# Patient Record
Sex: Female | Born: 1950 | Race: White | Hispanic: No | Marital: Single | Smoking: Current every day smoker
Health system: Southern US, Community
[De-identification: ages and names within clinical notes are randomized; demographics above are authoritative.]

## PROBLEM LIST (undated history)

## (undated) DIAGNOSIS — K219 Gastro-esophageal reflux disease without esophagitis: Secondary | ICD-10-CM

## (undated) DIAGNOSIS — R519 Headache, unspecified: Secondary | ICD-10-CM

## (undated) DIAGNOSIS — I639 Cerebral infarction, unspecified: Secondary | ICD-10-CM

## (undated) DIAGNOSIS — R0600 Dyspnea, unspecified: Secondary | ICD-10-CM

## (undated) DIAGNOSIS — M1990 Unspecified osteoarthritis, unspecified site: Secondary | ICD-10-CM

## (undated) DIAGNOSIS — J449 Chronic obstructive pulmonary disease, unspecified: Secondary | ICD-10-CM

## (undated) DIAGNOSIS — M5090 Cervical disc disorder, unspecified, unspecified cervical region: Secondary | ICD-10-CM

## (undated) HISTORY — PX: OOPHORECTOMY: SHX86

## (undated) HISTORY — PX: COLONOSCOPY WITH ESOPHAGOGASTRODUODENOSCOPY (EGD): SHX5779

## (undated) HISTORY — PX: OTHER SURGICAL HISTORY: SHX169

---
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
## 2016-04-15 HISTORY — PX: FRACTURE SURGERY: SHX138

## 2018-06-14 DIAGNOSIS — I639 Cerebral infarction, unspecified: Secondary | ICD-10-CM

## 2018-06-14 HISTORY — DX: Cerebral infarction, unspecified: I63.9

## 2019-01-11 ENCOUNTER — Encounter: Payer: Self-pay | Admitting: Emergency Medicine

## 2019-01-11 ENCOUNTER — Other Ambulatory Visit: Payer: Self-pay

## 2019-01-11 ENCOUNTER — Emergency Department: Payer: Medicare Other

## 2019-01-11 ENCOUNTER — Emergency Department
Admission: EM | Admit: 2019-01-11 | Discharge: 2019-01-11 | Disposition: A | Discharge status: Home / Self Care | Payer: Medicare Other | Attending: Student in an Organized Health Care Education/Training Program | Admitting: Student in an Organized Health Care Education/Training Program

## 2019-01-11 DIAGNOSIS — Y999 Unspecified external cause status: Secondary | ICD-10-CM | POA: Insufficient documentation

## 2019-01-11 DIAGNOSIS — Z8673 Personal history of transient ischemic attack (TIA), and cerebral infarction without residual deficits: Secondary | ICD-10-CM | POA: Insufficient documentation

## 2019-01-11 DIAGNOSIS — Y929 Unspecified place or not applicable: Secondary | ICD-10-CM | POA: Diagnosis not present

## 2019-01-11 DIAGNOSIS — X58XXXA Exposure to other specified factors, initial encounter: Secondary | ICD-10-CM | POA: Insufficient documentation

## 2019-01-11 DIAGNOSIS — M62830 Muscle spasm of back: Secondary | ICD-10-CM

## 2019-01-11 DIAGNOSIS — Z7901 Long term (current) use of anticoagulants: Secondary | ICD-10-CM | POA: Insufficient documentation

## 2019-01-11 DIAGNOSIS — M25561 Pain in right knee: Secondary | ICD-10-CM | POA: Insufficient documentation

## 2019-01-11 DIAGNOSIS — S32040S Wedge compression fracture of fourth lumbar vertebra, sequela: Secondary | ICD-10-CM

## 2019-01-11 DIAGNOSIS — S3992XA Unspecified injury of lower back, initial encounter: Secondary | ICD-10-CM | POA: Insufficient documentation

## 2019-01-11 DIAGNOSIS — S22070A Wedge compression fracture of T9-T10 vertebra, initial encounter for closed fracture: Secondary | ICD-10-CM | POA: Insufficient documentation

## 2019-01-11 DIAGNOSIS — Z79899 Other long term (current) drug therapy: Secondary | ICD-10-CM | POA: Diagnosis not present

## 2019-01-11 DIAGNOSIS — Y939 Activity, unspecified: Secondary | ICD-10-CM | POA: Diagnosis not present

## 2019-01-11 DIAGNOSIS — S22080A Wedge compression fracture of T11-T12 vertebra, initial encounter for closed fracture: Secondary | ICD-10-CM | POA: Diagnosis not present

## 2019-01-11 DIAGNOSIS — S32040A Wedge compression fracture of fourth lumbar vertebra, initial encounter for closed fracture: Secondary | ICD-10-CM | POA: Diagnosis not present

## 2019-01-11 DIAGNOSIS — S22000A Wedge compression fracture of unspecified thoracic vertebra, initial encounter for closed fracture: Secondary | ICD-10-CM

## 2019-01-11 DIAGNOSIS — S299XXA Unspecified injury of thorax, initial encounter: Secondary | ICD-10-CM | POA: Diagnosis present

## 2019-01-11 IMAGING — CR DG KNEE 1-2V*R*
1 series · 2 of 2 positions shown · non-contrast
Comparison: None.

CLINICAL DATA: Atraumatic medial right knee pain

EXAM:
RIGHT KNEE - 1-2 VIEW

[Series 1: dg knee 1-2 views right · 0.14mm/px · 2 of 2 slices shown]
[im 1/2]
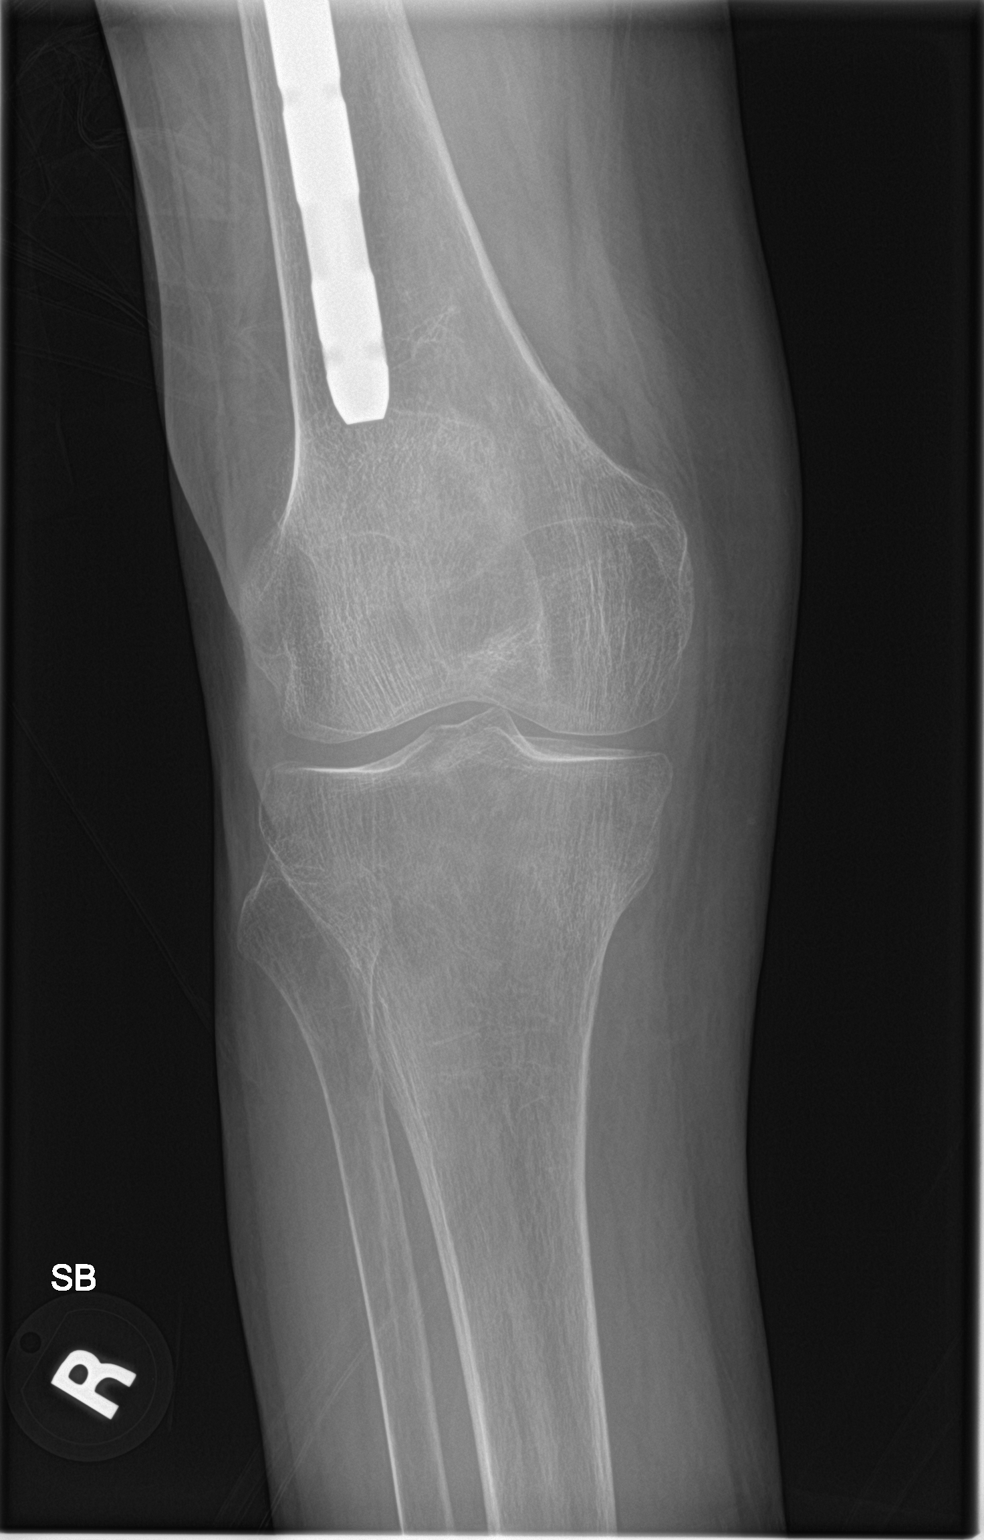
[im 2/2]
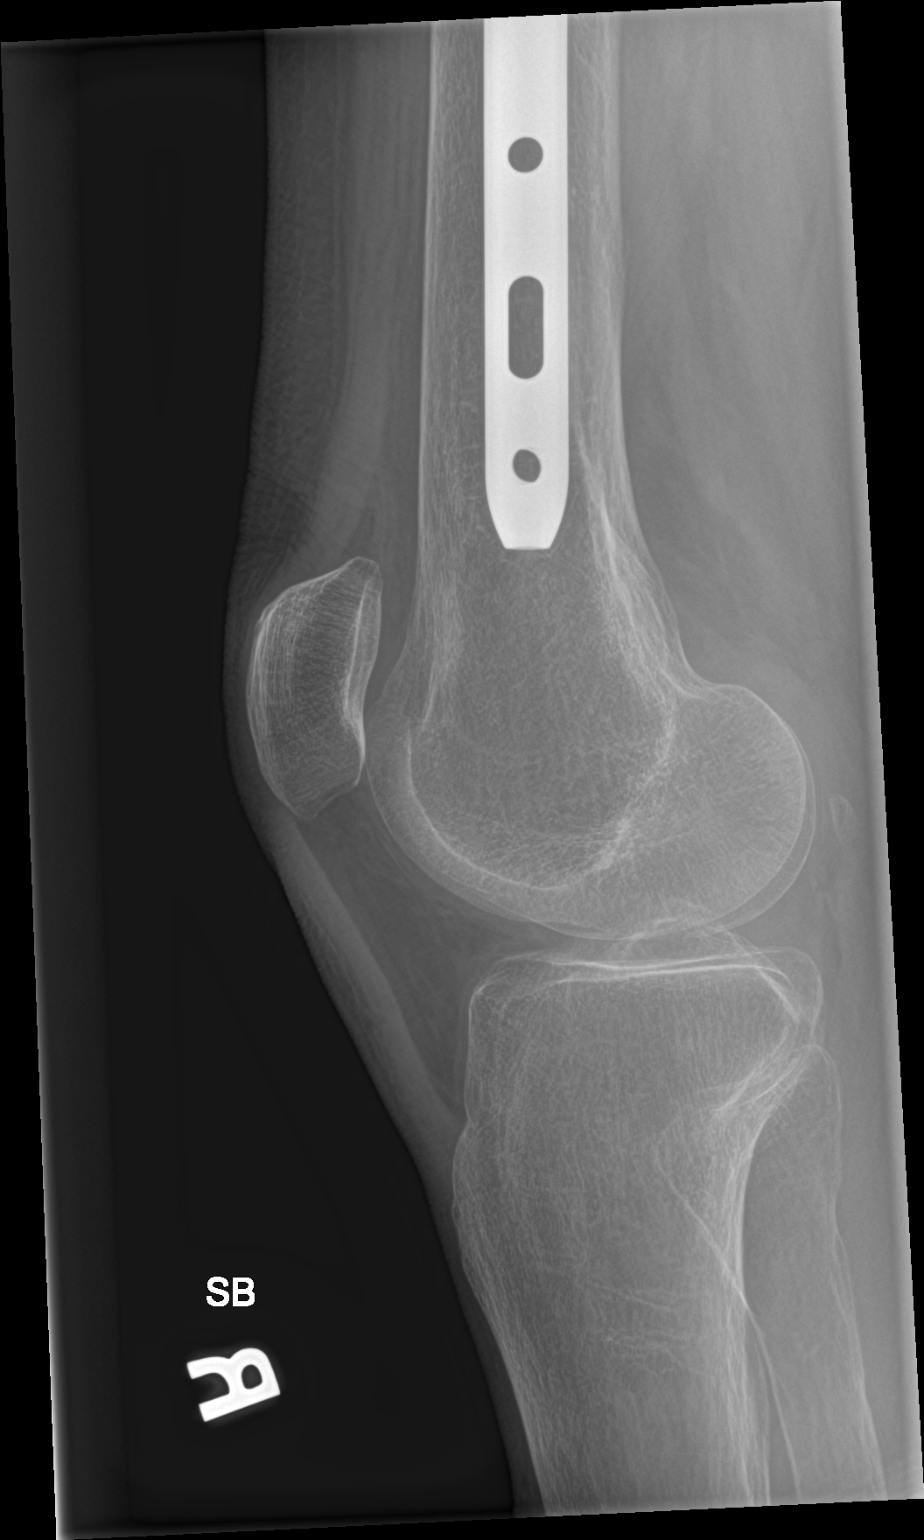

[2 of 2 positions shown; findings below may reference images not displayed]

FINDINGS: There is no displaced fracture. No dislocation. There is osteopenia.
There is joint space narrowing involving the medial compartment.
There is no significant joint effusion. There is a partially
visualized intramedullary nail extending through the distal right
femur.
IMPRESSION: 1. Medial compartment joint space narrowing. No acute bony
abnormality.
2. Osteopenia.

## 2019-01-11 IMAGING — CR DG LUMBAR SPINE 2-3V
1 series · 3 of 3 positions shown · non-contrast
Comparison: No recent.

CLINICAL DATA: Back and knee pain.

EXAM:
LUMBAR SPINE - 2-3 VIEW

[Series 1: dg lumbar spine 2-3 views · 0.14mm/px · 3 of 3 slices shown]
[im 1/3]
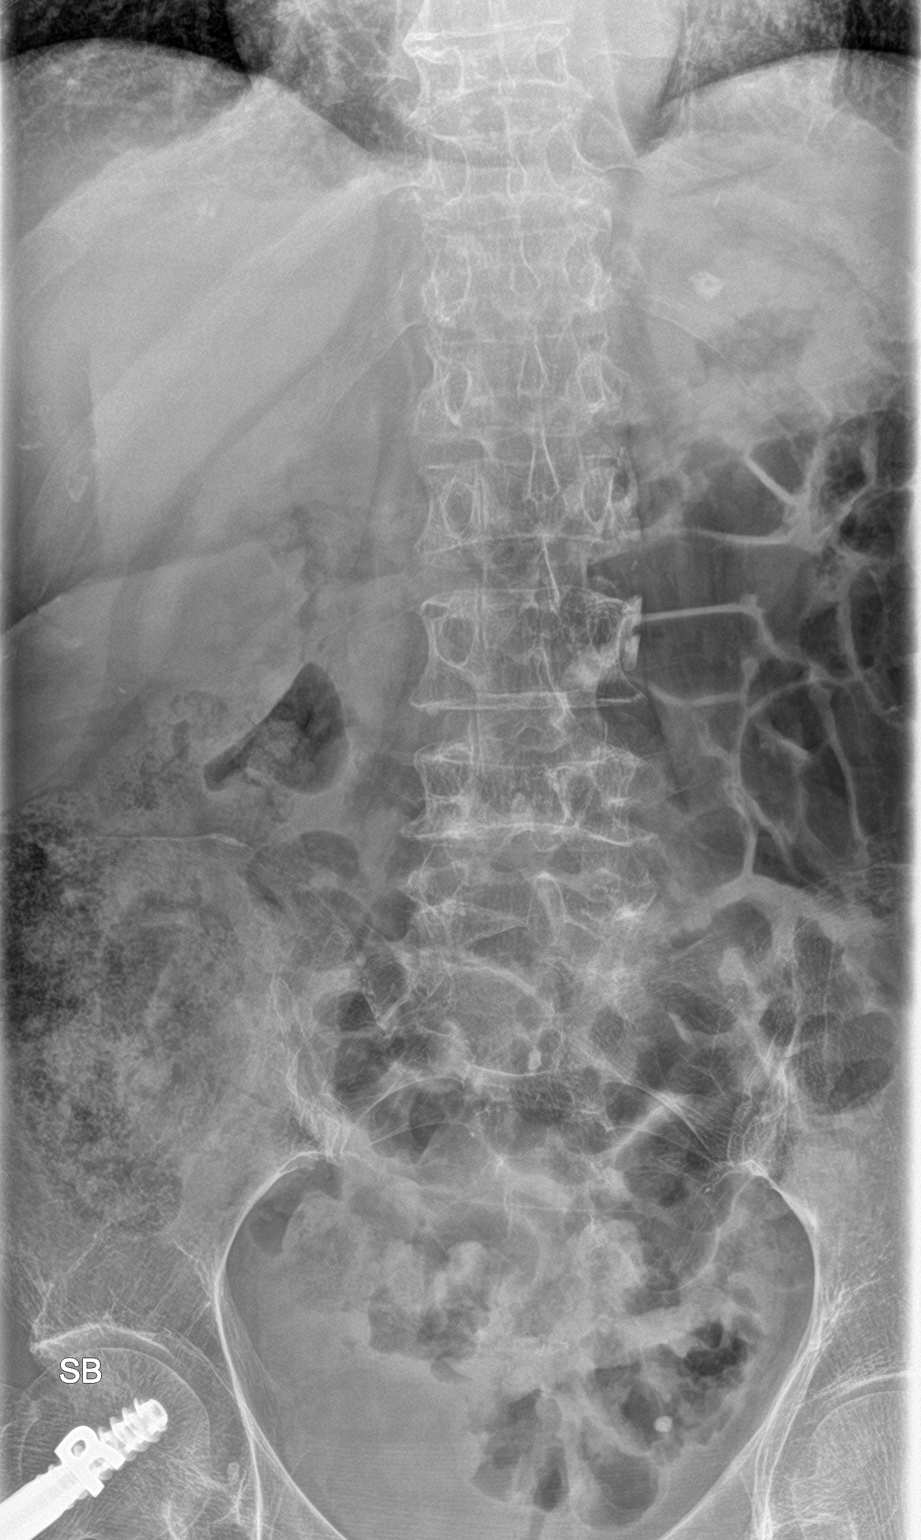
[im 2/3]
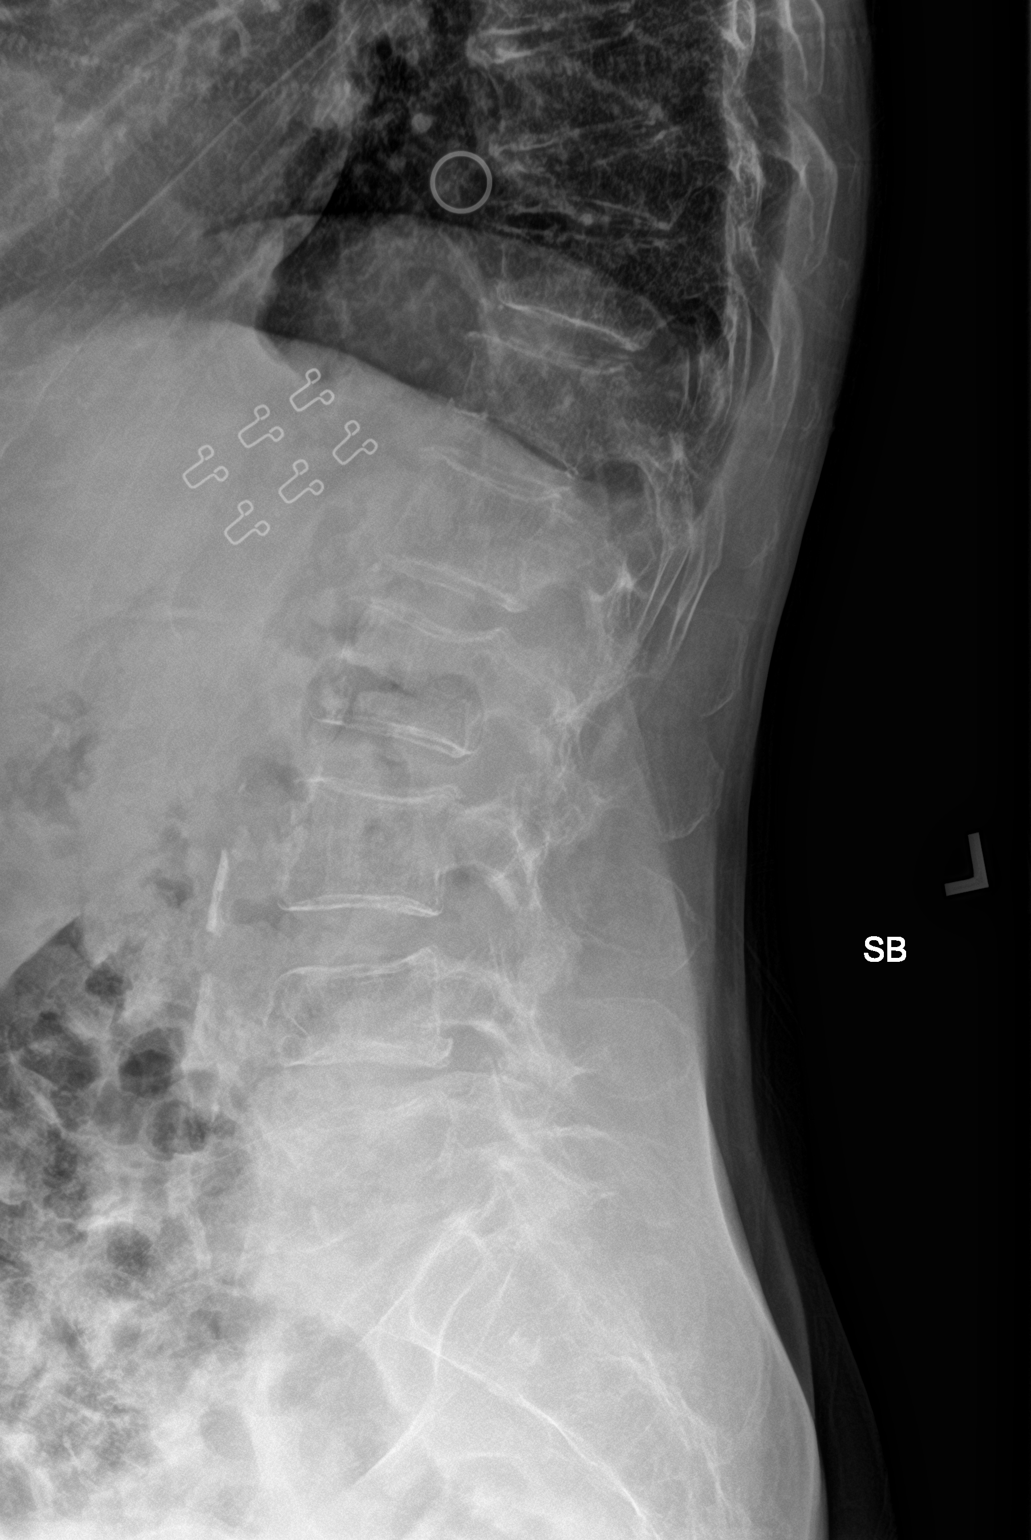
[im 3/3]
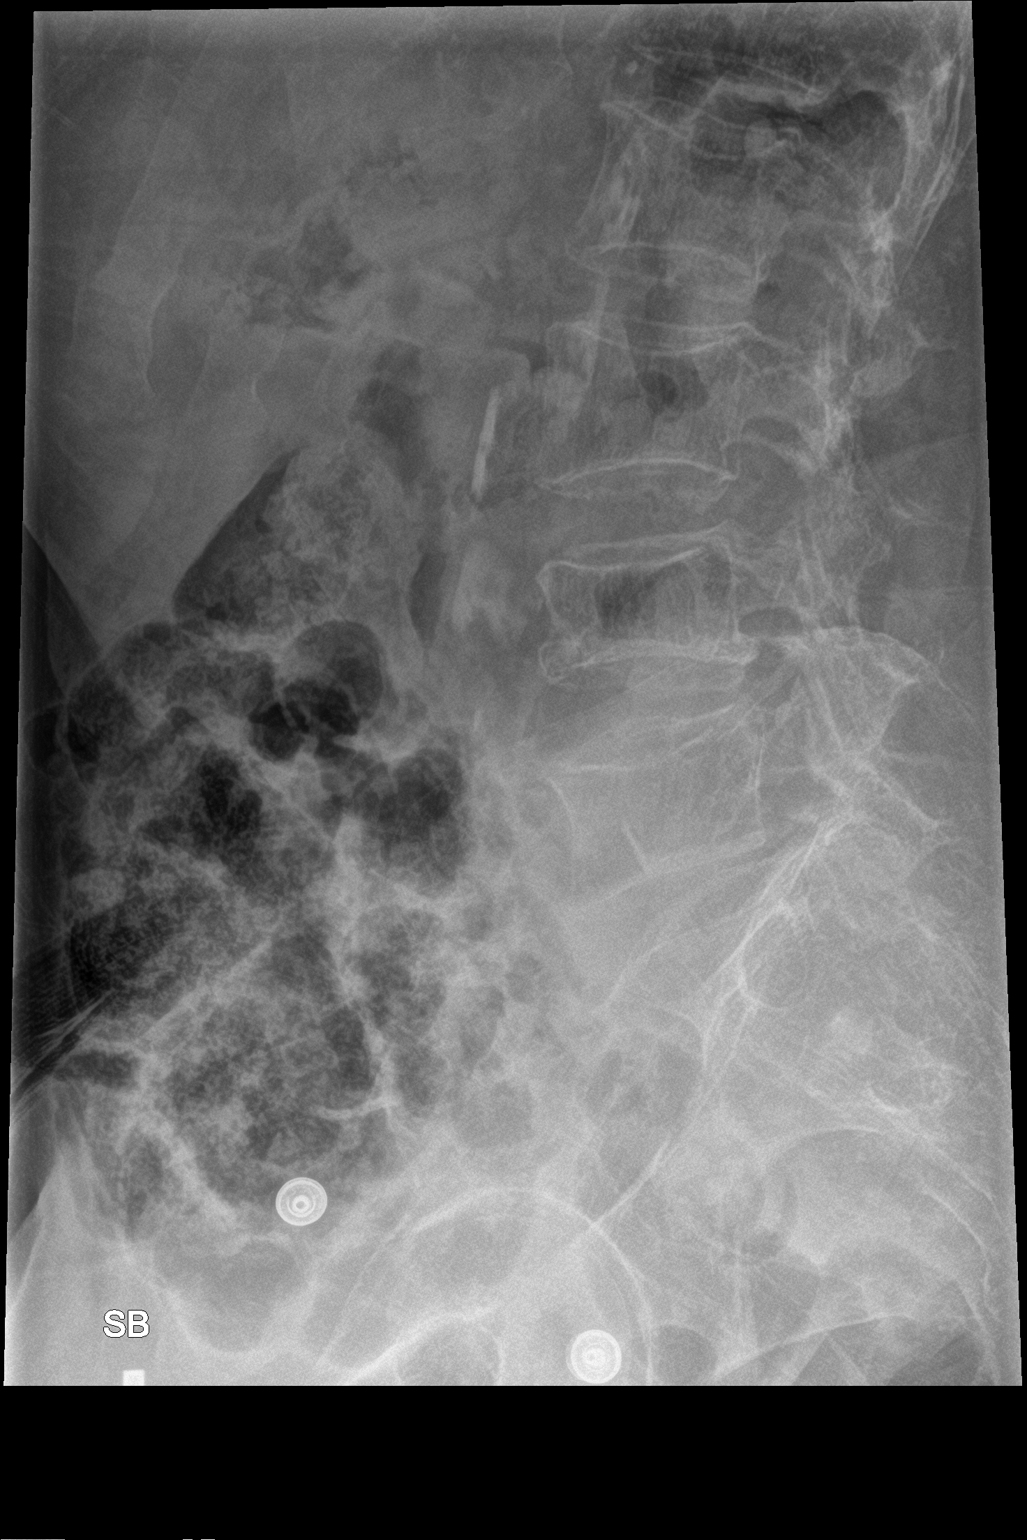

[3 of 3 positions shown; findings below may reference images not displayed]

FINDINGS: Lumbar spine numbered the lowest segmented appearing lumbar shaped
vertebrae on lateral view as L5. Paraspinal soft tissues normal.
Air-filled loops of small and large bowel noted. Adynamic ileus
cannot be excluded. Diffuse osteopenia and multilevel degenerative
change. Compression fractures of T10, T11, T12 and L4 noted. Age is
undetermined. Postsurgical changes right hip. Aortoiliac
atherosclerotic vascular calcification.
IMPRESSION: 1. Multiple thoracic and lumbar spine compression fractures as
above. Age undetermined.

2. Diffuse osteopenia degenerative change. Postsurgical changes
right hip.

3.  Aortoiliac atherosclerotic vascular disease.

## 2019-01-11 MED ORDER — TRAMADOL HCL 50 MG PO TABS: 50.0000 mg | ORAL_TABLET | Freq: Two times a day (BID) | ORAL | 0 refills | Status: DC | PRN

## 2019-01-11 MED ORDER — LIDOCAINE 5 % EX PTCH
1.0000 | MEDICATED_PATCH | CUTANEOUS | Status: DC
  Administered 2019-01-11: 14:00:00 1 via TRANSDERMAL
  Filled 2019-01-11: qty 1

## 2019-01-11 MED ORDER — ORPHENADRINE CITRATE 30 MG/ML IJ SOLN
30.0000 mg | Freq: Two times a day (BID) | INTRAMUSCULAR | Status: DC
  Administered 2019-01-11: 30 mg via INTRAMUSCULAR
  Filled 2019-01-11: qty 2

## 2019-01-11 MED ORDER — LIDOCAINE 5 % EX PTCH: 1.0000 | MEDICATED_PATCH | Freq: Two times a day (BID) | CUTANEOUS | 0 refills | Status: DC

## 2019-01-11 MED ORDER — KETOROLAC TROMETHAMINE 60 MG/2ML IM SOLN
30.0000 mg | Freq: Once | INTRAMUSCULAR | Status: AC
  Administered 2019-01-11: 13:00:00 30 mg via INTRAMUSCULAR
  Filled 2019-01-11: qty 2

## 2019-01-11 MED ORDER — CYCLOBENZAPRINE HCL 5 MG PO TABS: 5.0000 mg | ORAL_TABLET | Freq: Two times a day (BID) | ORAL | 0 refills | Status: AC | PRN

## 2019-01-11 NOTE — ED Triage Notes (Signed)
Mid back pain, states has been moving but denies fall.

## 2019-01-11 NOTE — ED Notes (Signed)
See triage note  Presents with lower back pain and right knee pain  States she just moved here from [place] yesterday    Developed pain yesterday  Hx of osteoporosis and has had several fx's  Denies any falls

## 2019-01-11 NOTE — ED Provider Notes (Signed)
[facility] Emergency Department Provider Note   ____________________________________________   First MD Initiated Contact with Patient 01/11/19 1234     (approximate)  I have reviewed the triage vital signs and the nursing notes.   HISTORY  Chief Complaint Back Pain    HPI Anne Hardy is a 68 y.o. female patient complain of right lateral back pain for approximately 2 weeks.  Patient state onset of pain while preparing to move from [place] to [place].  Patient denies radicular component to her pain.  Patient denies bladder/ bowel dysfunction.  Patient also complaining of right medial knee pain for 1 week.  Patient describes the back pain as "spasmatic".  Patient described knee pain as "achy".  Patient states movement aggravates both complaints.  No palliative measure for complaint.         Past Medical History:  Diagnosis Date  . Stroke [facility])     There are no active problems to display for this patient.     Prior to Admission medications   Medication Sig Start Date End Date Taking? Authorizing Provider  apixaban (ELIQUIS) 5 MG TABS tablet Take 5 mg by mouth 2 (two) times daily.   Yes [provider]  cyclobenzaprine (FLEXERIL) 5 MG tablet Take 5 mg by mouth 3 (three) times daily as needed for muscle spasms.   Yes [provider]  LORazepam (ATIVAN) 0.5 MG tablet Take 0.5 mg by mouth every 8 (eight) hours as needed for anxiety.   Yes [provider]  omeprazole (PRILOSEC) 40 MG capsule Take 40 mg by mouth daily.   Yes [provider]  traZODone (DESYREL) 50 MG tablet Take 50 mg by mouth at bedtime.   Yes [provider]  cyclobenzaprine (FLEXERIL) 5 MG tablet Take 1 tablet (5 mg total) by mouth 2 (two) times daily as needed for muscle spasms. 01/11/19   Sable Feil, PA-C  lidocaine (LIDODERM) 5 % Place 1 patch onto the skin every 12 (twelve) hours. Remove & Discard patch within 12 hours or as  directed by MD 01/11/19 01/11/20  Sable Feil, PA-C  traMADol (ULTRAM) 50 MG tablet Take 1 tablet (50 mg total) by mouth every 12 (twelve) hours as needed. 01/11/19   Sable Feil, PA-C    Allergies Patient has no known allergies.  No family history on file.  Social History Social History   Tobacco Use  . Smoking status: Not on file  Substance Use Topics  . Alcohol use: Not on file  . Drug use: Not on file    Review of Systems  Constitutional: No fever/chills Eyes: No visual changes. ENT: No sore throat. Cardiovascular: Denies chest pain. Respiratory: Denies shortness of breath. Gastrointestinal: No abdominal pain.  No nausea, no vomiting.  No diarrhea.  No constipation. Genitourinary: Negative for dysuria. Musculoskeletal: Positive for back and right leg pain. Skin: Negative for rash. Neurological: Negative for headaches, focal weakness or numbness.   ____________________________________________   PHYSICAL EXAM:  VITAL SIGNS: ED Triage Vitals [01/11/19 1225]  Enc Vitals Group     BP 126/72     Pulse Rate 73     Resp 18     Temp 97.9 F (36.6 C)     Temp Source Oral     SpO2 100 %     Weight 105 lb (47.6 kg)     Height 5\' 1"  (1.549 m)     Head Circumference      Peak Flow  Pain Score      Pain Loc      Pain Edu?      Excl. in [place]?    Constitutional: Alert and oriented. Well appearing and in no acute distress. Neck: No stridor.  No cervical spine tenderness to palpation. Cardiovascular: Normal rate, regular rhythm. Grossly normal heart sounds.  Good peripheral circulation. Respiratory: Normal respiratory effort.  No retractions. Lungs CTAB. Gastrointestinal: Soft and nontender. No distention. No abdominal bruits. No CVA tenderness. Musculoskeletal: No obvious thoracic to lumbar spine deformity.  Patient is moderate guarding palpation midthoracic area and lower back.  Examination of the right knee shows no obvious deformity.  Patient is moderate  guarding palpation the medial tibia tibular joint.  Neurologic:  Normal speech and language. No gross focal neurologic deficits are appreciated. No gait instability. Skin:  Skin is warm, dry and intact. No rash noted. Psychiatric: Mood and affect are normal. Speech and behavior are normal.  ____________________________________________   LABS (all labs ordered are listed, but only abnormal results are displayed)  Labs Reviewed - No data to display ____________________________________________  EKG   ____________________________________________  RADIOLOGY  ED MD interpretation:    Official radiology report(s): Dg Lumbar Spine 2-3 Views  Result Date: 01/11/2019 CLINICAL DATA:  Back and knee pain. EXAM: LUMBAR SPINE - 2-3 VIEW COMPARISON:  No recent. FINDINGS: Lumbar spine numbered the lowest segmented appearing lumbar shaped vertebrae on lateral view as L5. Paraspinal soft tissues normal. Air-filled loops of small and large bowel noted. Adynamic ileus cannot be excluded. Diffuse osteopenia and multilevel degenerative change. Compression fractures of T10, T11, T12 and L4 noted. Age is undetermined. Postsurgical changes right hip. Aortoiliac atherosclerotic vascular calcification. IMPRESSION: 1. Multiple thoracic and lumbar spine compression fractures as above. Age undetermined. 2. Diffuse osteopenia degenerative change. Postsurgical changes right hip. 3.  Aortoiliac atherosclerotic vascular disease. Electronically Signed   By: Marcello Moores  Register   On: 01/11/2019 13:41   Dg Knee 2 Views Right  Result Date: 01/11/2019 CLINICAL DATA:  Atraumatic medial right knee pain EXAM: RIGHT KNEE - 1-2 VIEW COMPARISON:  None. FINDINGS: There is no displaced fracture. No dislocation. There is osteopenia. There is joint space narrowing involving the medial compartment. There is no significant joint effusion. There is a partially visualized intramedullary nail extending through the distal right femur.  IMPRESSION: 1. Medial compartment joint space narrowing. No acute bony abnormality. 2. Osteopenia. Electronically Signed   By: Constance Holster M.D.   On: 01/11/2019 13:36    ____________________________________________   PROCEDURES  Procedure(s) performed (including Critical Care):  Procedures   ____________________________________________   INITIAL IMPRESSION / ASSESSMENT AND PLAN / ED COURSE  As part of my medical decision making, I reviewed the following data within the [place] was evaluated in Emergency Department on 01/11/2019 for the symptoms described in the history of present illness. She was evaluated in the context of the global COVID-19 pandemic, which necessitated consideration that the patient might be at risk for infection with the SARS-CoV-2 virus that causes COVID-19. Institutional protocols and algorithms that pertain to the evaluation of patients at risk for COVID-19 are in a state of rapid change based on information released by regulatory bodies including the CDC and federal and state organizations. These policies and algorithms were followed during the patient's care in the ED.  Patient presents with upper/  lower back pain and right knee pain.  Discussed x-ray finding with patient was  consistent with multiple compression fractures of the T and lumbar spine.  Patient degenerative changes in the right knee.  Patient will be follow-up with orthopedics for definitive evaluation and treatment.  Patient given discharge care instructions.      ____________________________________________   FINAL CLINICAL IMPRESSION(S) / ED DIAGNOSES  Final diagnoses:  Compression fracture of body of thoracic vertebra (HCC)  Compression fracture of L4 lumbar vertebra, sequela  Muscle spasm of back     ED Discharge Orders         Ordered    cyclobenzaprine (FLEXERIL) 5 MG tablet  2 times daily PRN     01/11/19 1407    traMADol  (ULTRAM) 50 MG tablet  Every 12 hours PRN     01/11/19 1407    lidocaine (LIDODERM) 5 %  Every 12 hours     01/11/19 1407           Note:  This document was prepared using Dragon voice recognition software and may include unintentional dictation errors.    Sable Feil, PA-C 01/11/19 1412    Merlyn Lot, MD 01/11/19 [phone]

## 2019-01-11 NOTE — Discharge Instructions (Addendum)
Follow discharge care instructions take medication as directed.  Schedule appointment for orthopedic for definitive evaluation and treatment.  Establish care with family doctor to get a consult to rheumatology.

## 2019-01-14 ENCOUNTER — Other Ambulatory Visit: Payer: Self-pay | Admitting: Orthopedic Surgery

## 2019-01-14 DIAGNOSIS — G8929 Other chronic pain: Secondary | ICD-10-CM

## 2019-01-20 ENCOUNTER — Other Ambulatory Visit: Payer: Self-pay | Admitting: Orthopedic Surgery

## 2019-01-20 DIAGNOSIS — S22000A Wedge compression fracture of unspecified thoracic vertebra, initial encounter for closed fracture: Secondary | ICD-10-CM

## 2019-01-20 DIAGNOSIS — S32040A Wedge compression fracture of fourth lumbar vertebra, initial encounter for closed fracture: Secondary | ICD-10-CM

## 2019-01-21 ENCOUNTER — Ambulatory Visit
Admission: RE | Admit: 2019-01-21 | Discharge: 2019-01-21 | Disposition: A | Discharge status: Home / Self Care | Payer: Medicare Other | Source: Ambulatory Visit | Attending: Orthopedic Surgery | Admitting: Orthopedic Surgery

## 2019-01-21 ENCOUNTER — Other Ambulatory Visit: Payer: Self-pay

## 2019-01-21 DIAGNOSIS — S22000A Wedge compression fracture of unspecified thoracic vertebra, initial encounter for closed fracture: Secondary | ICD-10-CM | POA: Diagnosis present

## 2019-01-21 DIAGNOSIS — S32040A Wedge compression fracture of fourth lumbar vertebra, initial encounter for closed fracture: Secondary | ICD-10-CM

## 2019-01-21 IMAGING — MR MR THORACIC SPINE W/O CM
5 of 6 series · 27 of 48 positions shown · non-contrast
Comparison: None.

CLINICAL DATA: Mid low back pain for 3-4 weeks

EXAM:
MRI THORACIC AND LUMBAR SPINE WITHOUT CONTRAST
TECHNIQUE: Multiplanar and multiecho pulse sequences of the thoracic and lumbar
spine were obtained without intravenous contrast.

[Series 16: T1 · sagittal · 5.0mm · 1.88mm/px · 2 of 9 slices shown (1 of 2)]
[im 1/9]
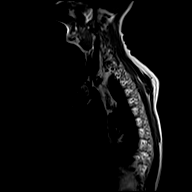
[im 9/9]
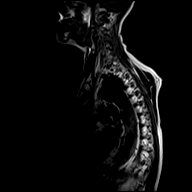

[Series 17: T2 · sagittal · 3.0mm · 1.06mm/px · 6 of 17 slices shown (1 of 2)]
[im 1/17]
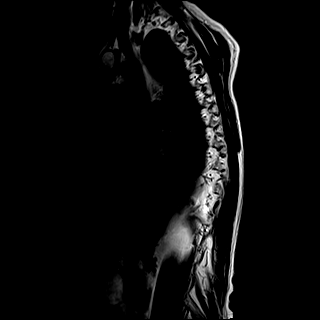
[im 4/17]
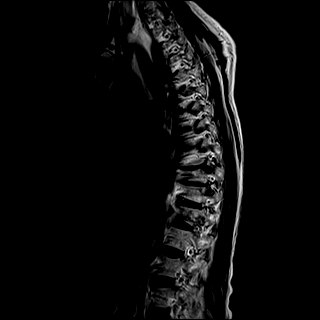
[im 7/17]
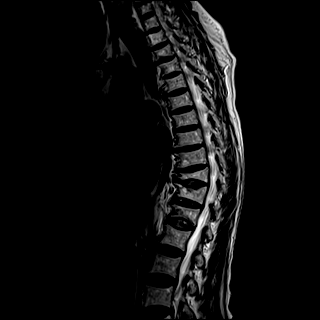
[im 10/17]
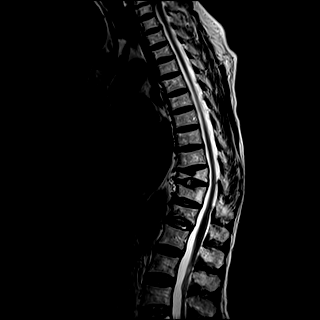
[im 13/17]
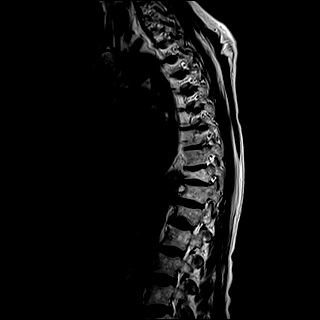
[im 17/17]
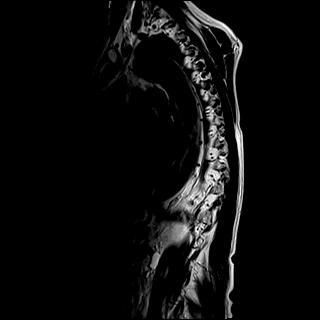

[Series 18: T1 · sagittal · 3.0mm · 1.06mm/px · 6 of 17 slices shown (2 of 2)]
[im 1/17]
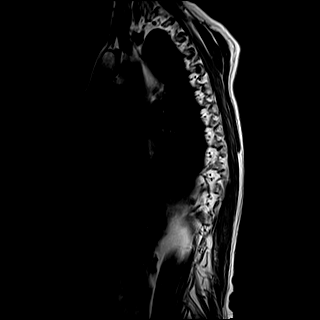
[im 4/17]
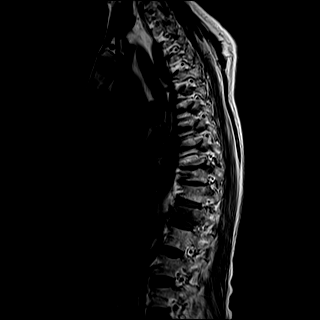
[im 7/17]
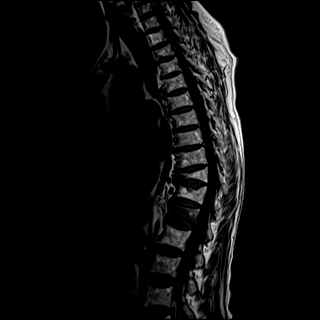
[im 10/17]
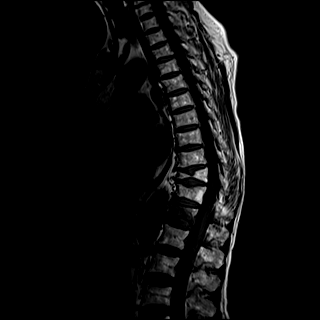
[im 13/17]
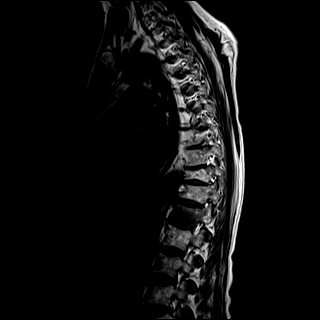
[im 17/17]
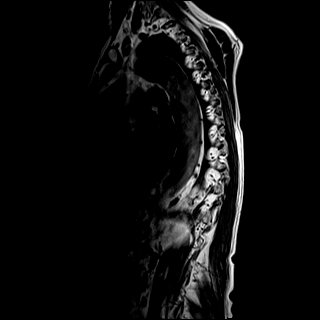

[Series 19: STIR · sagittal · 3.0mm · 0.53mm/px · 5 of 17 slices shown]
[im 1/17]
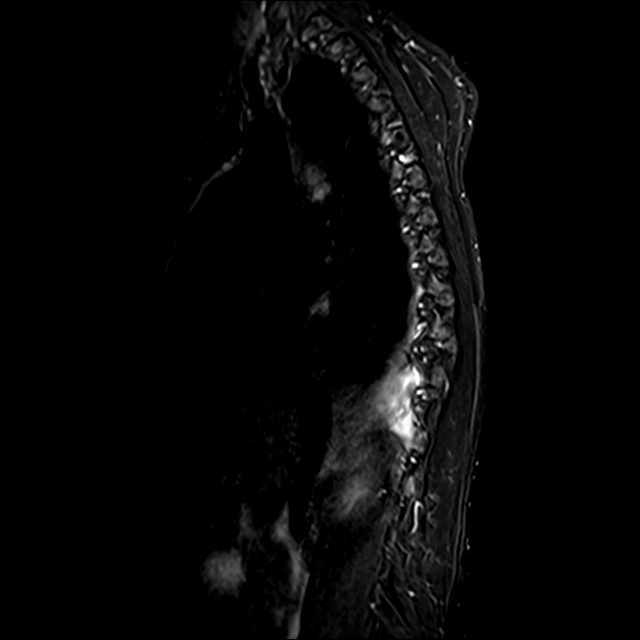
[im 4/17]
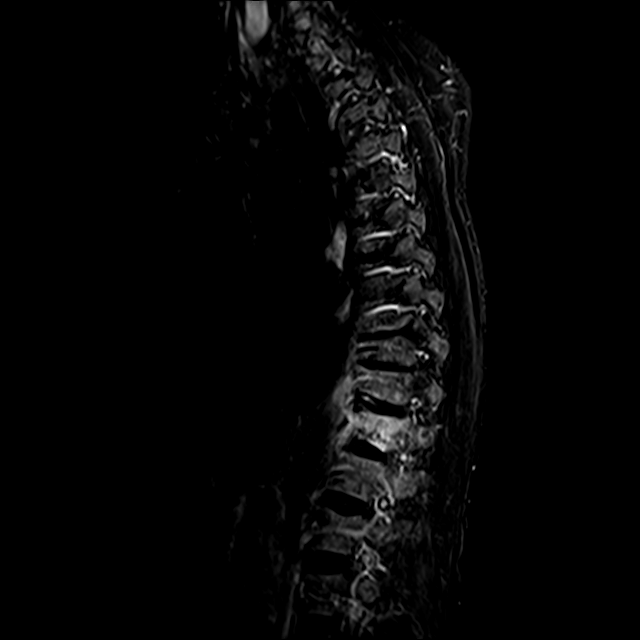
[im 7/17]
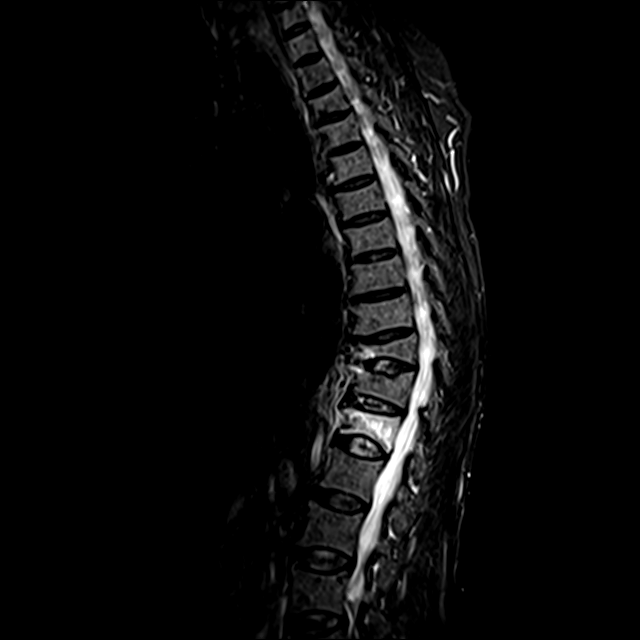
[im 10/17]
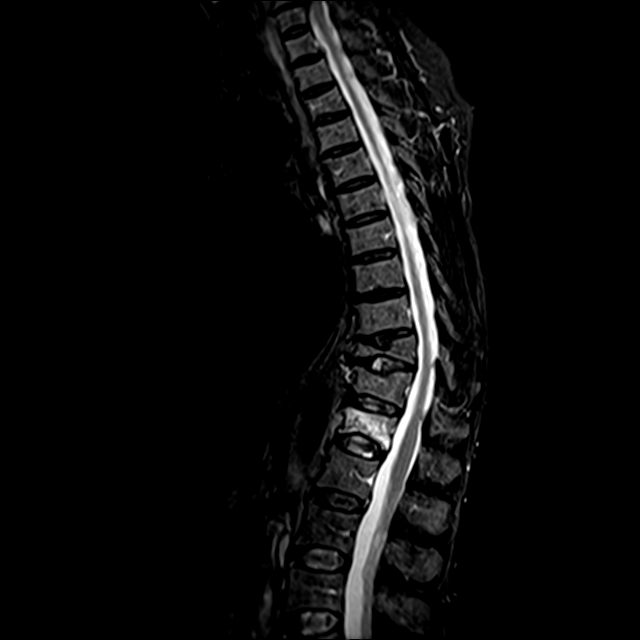
[im 13/17]
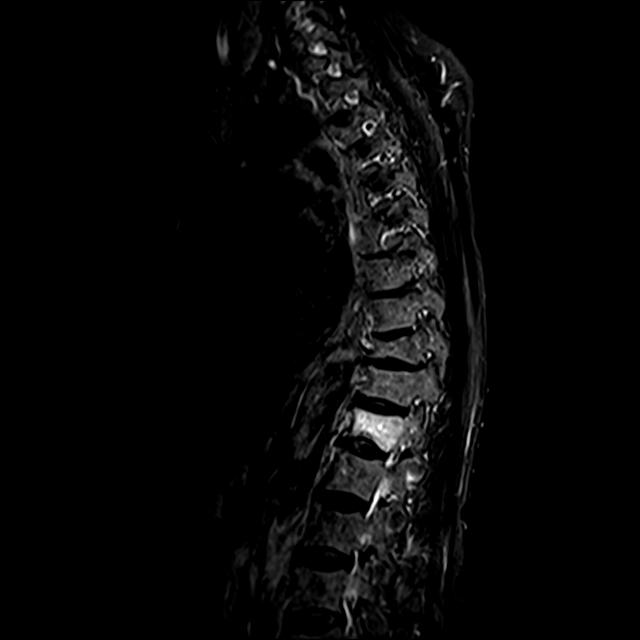

[Series 20: T2 · axial · 4.0mm · 0.59mm/px · z∈[-237,-34]mm · 8 of 41 slices shown (2 of 2)]
[im 1/41]
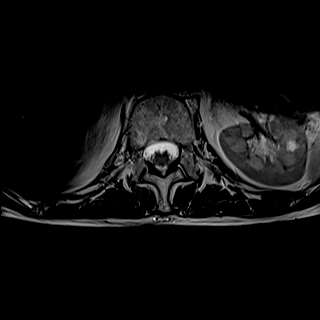
[im 7/41]
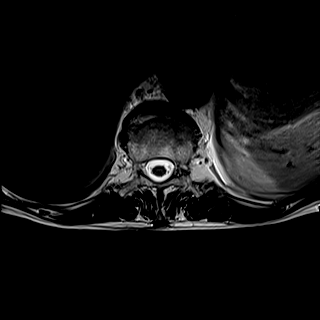
[im 13/41]
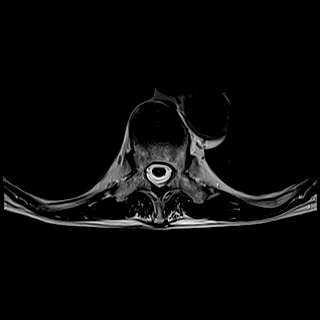
[im 19/41]
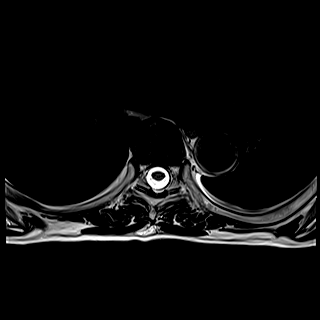
[im 22/41]
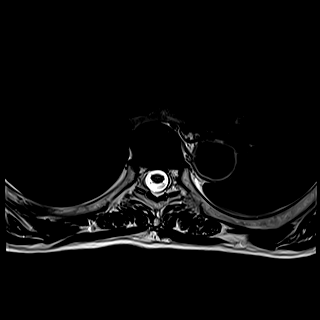
[im 28/41]
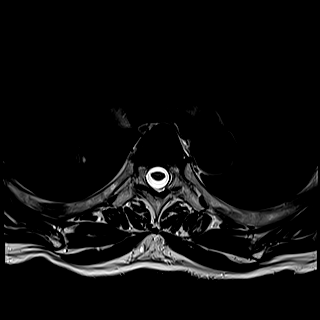
[im 34/41]
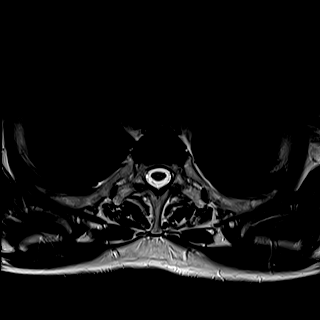
[im 41/41]
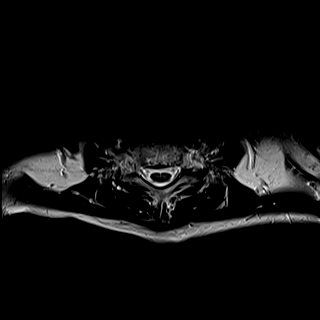

[27 of 48 positions shown; findings below may reference images not displayed]

FINDINGS: MRI THORACIC SPINE FINDINGS

Alignment:  Physiologic.  Relative kyphosis centered at T10

Vertebrae: No discitis or osteomyelitis. No aggressive osseous
lesion. Severe T10 chronic vertebral body compression fracture with
approximately 85% height loss. Chronic T11 vertebral body
compression fracture with approximately 20% anterior height loss.
Acute T12 vertebral body compression fracture with severe marrow
edema throughout the vertebral body and approximately 50% height
loss.

Cord:  Normal signal and morphology.

Paraspinal and other soft tissues: No acute paraspinal abnormality.
12 mm indeterminate right upper lobe pulmonary nodule.

Disc levels:

Disc spaces: Mild degenerative disease with disc height loss at
T10-11.

T1-T2: No disc protrusion, foraminal stenosis or central canal
stenosis.

T2-T3: No disc protrusion, foraminal stenosis or central canal
stenosis.

T3-T4: No disc protrusion, foraminal stenosis or central canal
stenosis.

T4-T5: No disc protrusion, foraminal stenosis or central canal
stenosis.

T5-T6: No disc protrusion, foraminal stenosis or central canal
stenosis.

T6-T7: No disc protrusion, foraminal stenosis or central canal
stenosis.

T7-T8: No disc protrusion, foraminal stenosis or central canal
stenosis.

T8-T9: Mild broad-based disc bulge with a tiny central disc
protrusion. No foraminal or central canal stenosis.

T9-T10: No disc protrusion, foraminal stenosis or central canal
stenosis.

T10-T11: No disc protrusion, foraminal stenosis or central canal
stenosis.

T11-T12: No disc protrusion, foraminal stenosis or central canal
stenosis.

MRI LUMBAR SPINE FINDINGS

Segmentation:  Standard.

Alignment:  Physiologic.

Vertebrae: L4 vertebral body compression fracture with approximately
50% height loss and minimal marrow edema within the vertebral body.
No discitis or osteomyelitis. No aggressive osseous lesion.

Conus medullaris and cauda equina: Conus extends to the L1 level.
Conus and cauda equina appear normal.

Paraspinal and other soft tissues: No acute paraspinal abnormality.

Disc levels:

Disc spaces: Disc spaces are relatively well maintained. Disc
desiccation at L5-S1.

T12-L1: No significant disc bulge. No evidence of neural foraminal
stenosis. No central canal stenosis.

L1-L2: No significant disc bulge. No evidence of neural foraminal
stenosis. No central canal stenosis.

L2-L3: No significant disc bulge. No evidence of neural foraminal
stenosis. No central canal stenosis.

L3-L4: No significant disc bulge. No evidence of neural foraminal
stenosis. No central canal stenosis.

L4-L5: Mild broad-based disc bulge with a broad central disc
protrusion. Bilateral lateral recess narrowing. Mild bilateral
foraminal narrowing. No central canal stenosis.

L5-S1: Broad-based disc bulge with a small central disc protrusion
and a central annular fissure. No evidence of neural foraminal
stenosis. No central canal stenosis.
IMPRESSION: MR THORACIC SPINE IMPRESSION

1. Acute T12 vertebral body compression fracture with approximately
50% height loss and marrow edema throughout the vertebral body.
2. Chronic T10 and T11 vertebral body compression fractures.
3. Indeterminate 12 mm right upper lobe pulmonary nodule. Recommend
further evaluation with a dedicated CT of the chest.

MR LUMBAR SPINE IMPRESSION

1. Subacute-chronic L4 vertebral body compression fracture with
minimal residual marrow edema within the vertebral body.
2. At L4-5 there is a mild broad-based disc bulge with a broad
central disc protrusion. Mild bilateral lateral recess narrowing and
mild bilateral foraminal narrowing.
3. At L5-S1 there is a broad-based disc bulge with a small central
disc protrusion and a central annular fissure.

## 2019-01-21 IMAGING — MR MR LUMBAR SPINE W/O CM
4 of 5 series · 32 of 48 positions shown · non-contrast
Comparison: None.

CLINICAL DATA: Mid low back pain for 3-4 weeks

EXAM:
MRI THORACIC AND LUMBAR SPINE WITHOUT CONTRAST
TECHNIQUE: Multiplanar and multiecho pulse sequences of the thoracic and lumbar
spine were obtained without intravenous contrast.

[Series 22: T2 · sagittal · 4.0mm · 0.81mm/px · 8 of 15 slices shown (1 of 2)]
[im 1/15]
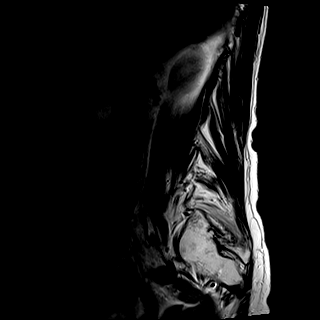
[im 3/15]
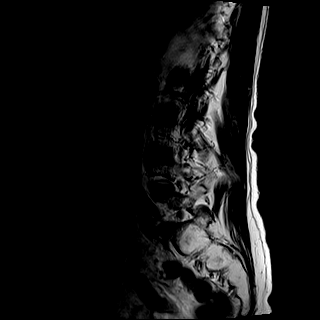
[im 5/15]
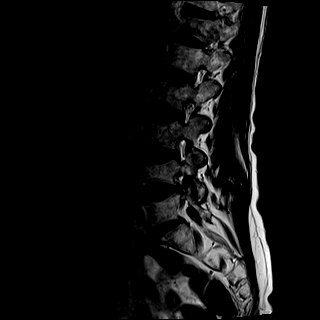
[im 7/15]
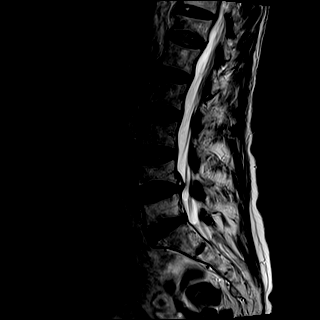
[im 9/15]
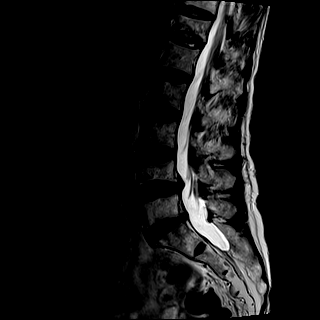
[im 11/15]
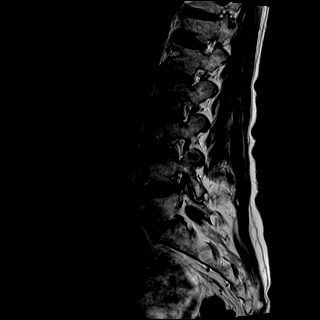
[im 13/15]
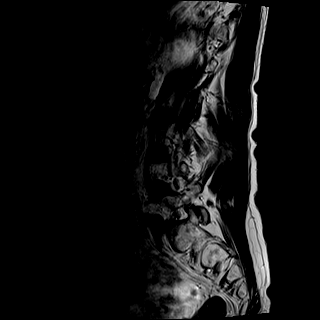
[im 15/15]
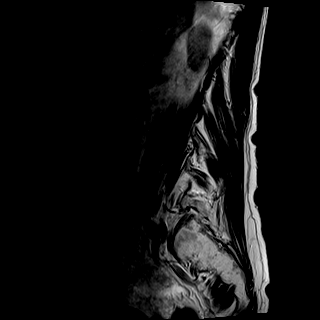

[Series 23: T1 · sagittal · 4.0mm · 0.81mm/px · 7 of 15 slices shown (1 of 2)]
[im 1/15]
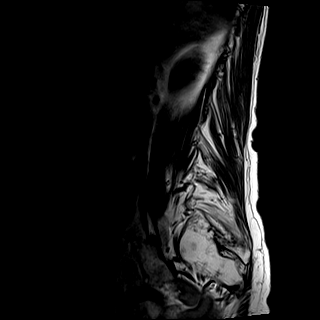
[im 3/15]
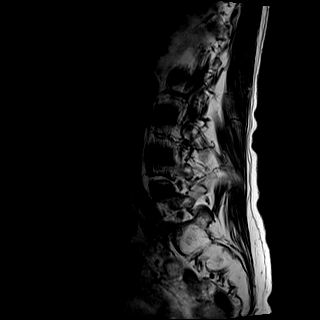
[im 5/15]
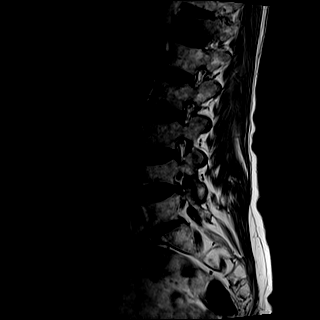
[im 8/15]
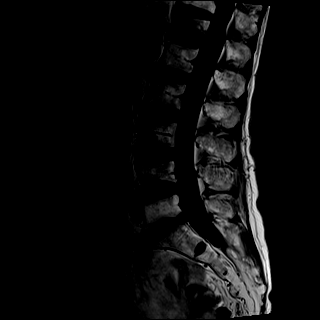
[im 10/15]
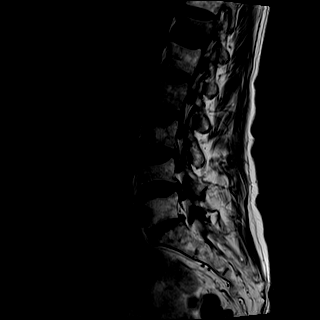
[im 12/15]
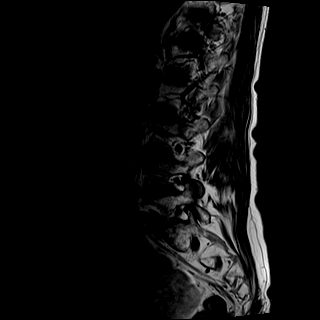
[im 15/15]
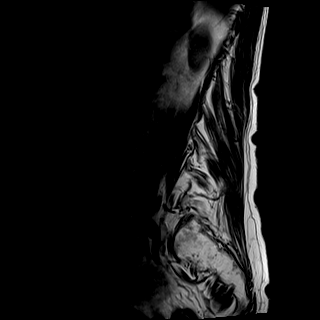

[Series 25: T2 · axial · 4.0mm · 0.78mm/px · z∈[-413,-244]mm · 9 of 26 slices shown (2 of 2)]
[im 1/26]
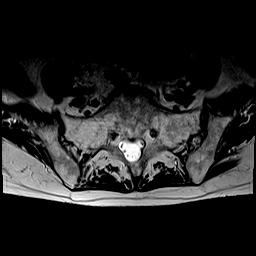
[im 5/26]
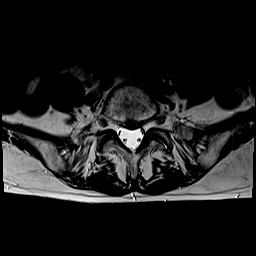
[im 9/26]
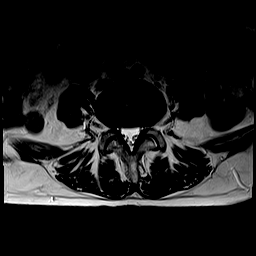
[im 11/26]
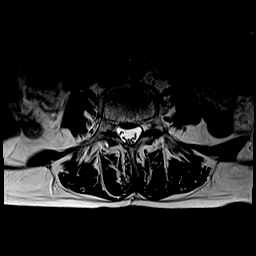
[im 13/26]
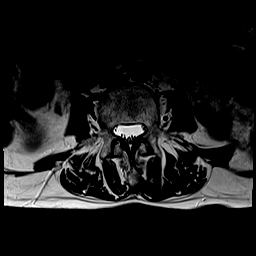
[im 15/26]
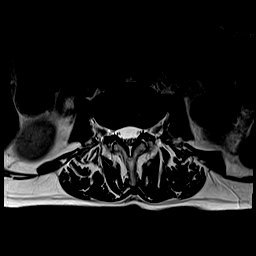
[im 17/26]
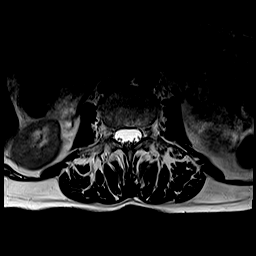
[im 21/26]
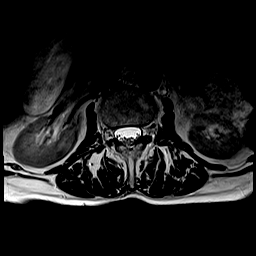
[im 26/26]
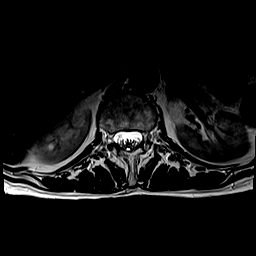

[Series 26: T1 · axial · 4.0mm · 0.39mm/px · z∈[-413,-273]mm · 8 of 26 slices shown (2 of 2)]
[im 1/26]
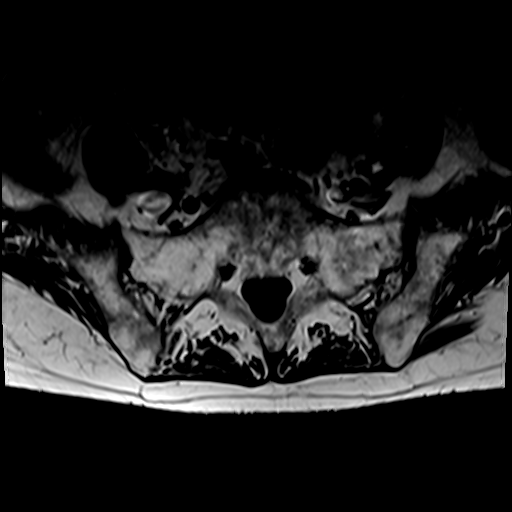
[im 5/26]
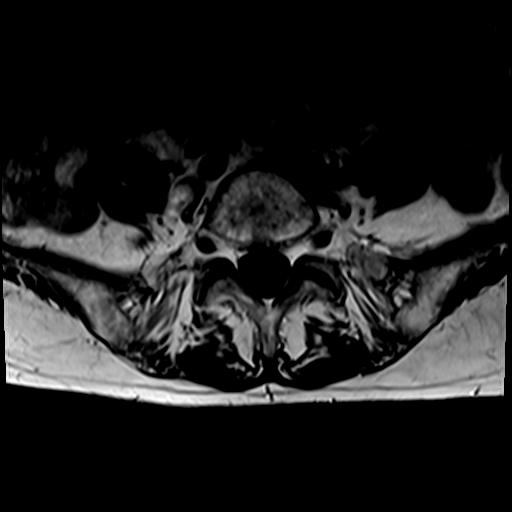
[im 9/26]
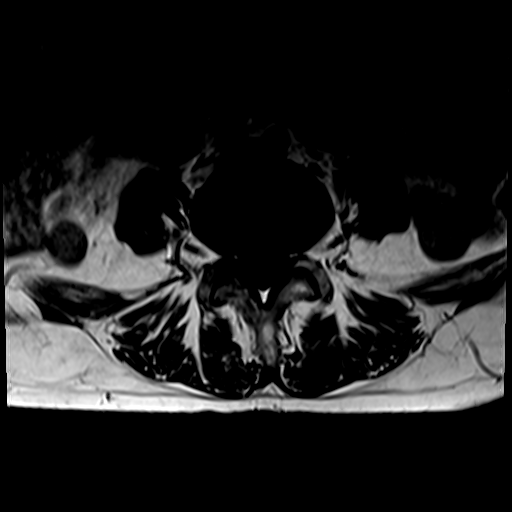
[im 11/26]
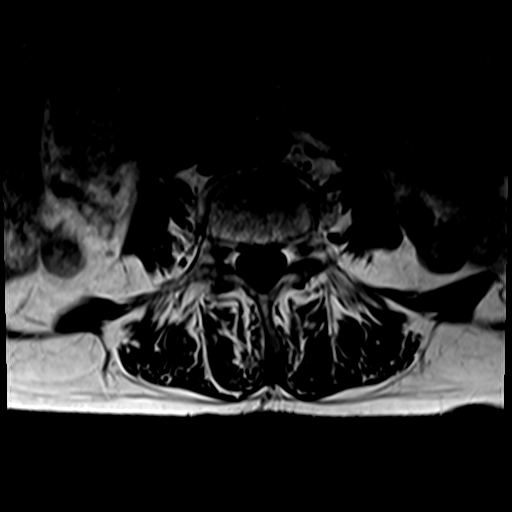
[im 13/26]
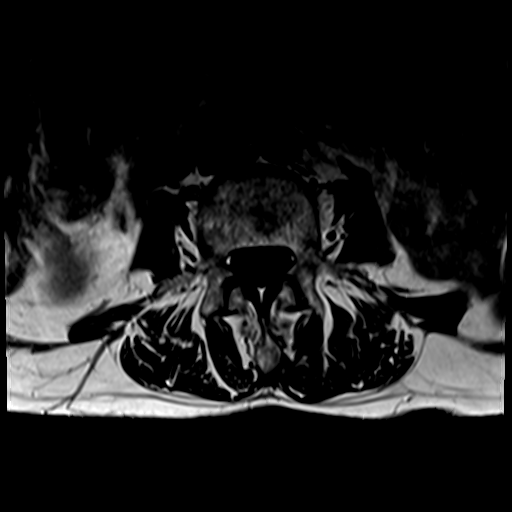
[im 15/26]
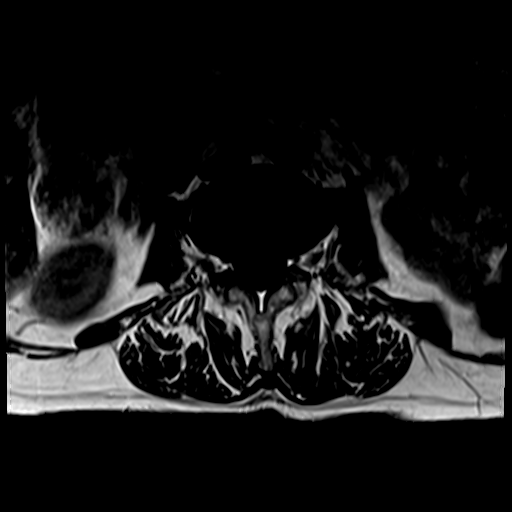
[im 17/26]
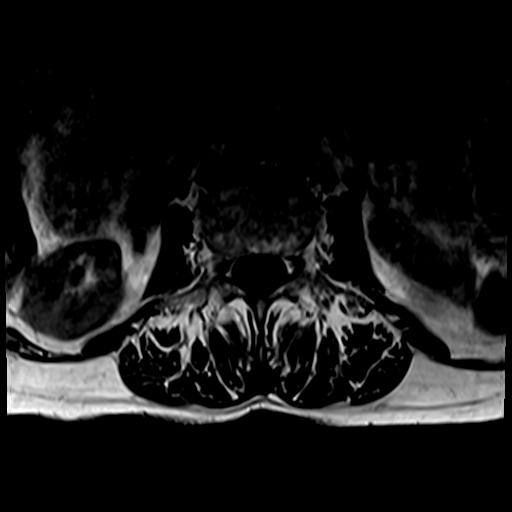
[im 21/26]
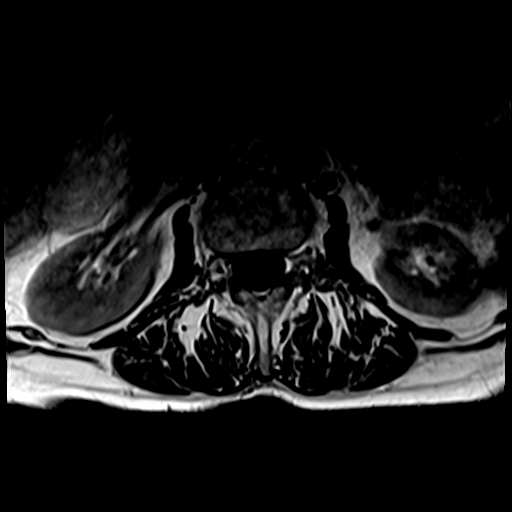

[32 of 48 positions shown; findings below may reference images not displayed]

FINDINGS: MRI THORACIC SPINE FINDINGS

Alignment:  Physiologic.  Relative kyphosis centered at T10

Vertebrae: No discitis or osteomyelitis. No aggressive osseous
lesion. Severe T10 chronic vertebral body compression fracture with
approximately 85% height loss. Chronic T11 vertebral body
compression fracture with approximately 20% anterior height loss.
Acute T12 vertebral body compression fracture with severe marrow
edema throughout the vertebral body and approximately 50% height
loss.

Cord:  Normal signal and morphology.

Paraspinal and other soft tissues: No acute paraspinal abnormality.
12 mm indeterminate right upper lobe pulmonary nodule.

Disc levels:

Disc spaces: Mild degenerative disease with disc height loss at
T10-11.

T1-T2: No disc protrusion, foraminal stenosis or central canal
stenosis.

T2-T3: No disc protrusion, foraminal stenosis or central canal
stenosis.

T3-T4: No disc protrusion, foraminal stenosis or central canal
stenosis.

T4-T5: No disc protrusion, foraminal stenosis or central canal
stenosis.

T5-T6: No disc protrusion, foraminal stenosis or central canal
stenosis.

T6-T7: No disc protrusion, foraminal stenosis or central canal
stenosis.

T7-T8: No disc protrusion, foraminal stenosis or central canal
stenosis.

T8-T9: Mild broad-based disc bulge with a tiny central disc
protrusion. No foraminal or central canal stenosis.

T9-T10: No disc protrusion, foraminal stenosis or central canal
stenosis.

T10-T11: No disc protrusion, foraminal stenosis or central canal
stenosis.

T11-T12: No disc protrusion, foraminal stenosis or central canal
stenosis.

MRI LUMBAR SPINE FINDINGS

Segmentation:  Standard.

Alignment:  Physiologic.

Vertebrae: L4 vertebral body compression fracture with approximately
50% height loss and minimal marrow edema within the vertebral body.
No discitis or osteomyelitis. No aggressive osseous lesion.

Conus medullaris and cauda equina: Conus extends to the L1 level.
Conus and cauda equina appear normal.

Paraspinal and other soft tissues: No acute paraspinal abnormality.

Disc levels:

Disc spaces: Disc spaces are relatively well maintained. Disc
desiccation at L5-S1.

T12-L1: No significant disc bulge. No evidence of neural foraminal
stenosis. No central canal stenosis.

L1-L2: No significant disc bulge. No evidence of neural foraminal
stenosis. No central canal stenosis.

L2-L3: No significant disc bulge. No evidence of neural foraminal
stenosis. No central canal stenosis.

L3-L4: No significant disc bulge. No evidence of neural foraminal
stenosis. No central canal stenosis.

L4-L5: Mild broad-based disc bulge with a broad central disc
protrusion. Bilateral lateral recess narrowing. Mild bilateral
foraminal narrowing. No central canal stenosis.

L5-S1: Broad-based disc bulge with a small central disc protrusion
and a central annular fissure. No evidence of neural foraminal
stenosis. No central canal stenosis.
IMPRESSION: MR THORACIC SPINE IMPRESSION

1. Acute T12 vertebral body compression fracture with approximately
50% height loss and marrow edema throughout the vertebral body.
2. Chronic T10 and T11 vertebral body compression fractures.
3. Indeterminate 12 mm right upper lobe pulmonary nodule. Recommend
further evaluation with a dedicated CT of the chest.

MR LUMBAR SPINE IMPRESSION

1. Subacute-chronic L4 vertebral body compression fracture with
minimal residual marrow edema within the vertebral body.
2. At L4-5 there is a mild broad-based disc bulge with a broad
central disc protrusion. Mild bilateral lateral recess narrowing and
mild bilateral foraminal narrowing.
3. At L5-S1 there is a broad-based disc bulge with a small central
disc protrusion and a central annular fissure.

## 2019-01-26 ENCOUNTER — Other Ambulatory Visit: Payer: Self-pay

## 2019-01-26 ENCOUNTER — Encounter
Admission: RE | Admit: 2019-01-26 | Discharge: 2019-01-26 | Disposition: A | Discharge status: Home / Self Care | Payer: Medicare Other | Source: Ambulatory Visit | Attending: Orthopedic Surgery | Admitting: Orthopedic Surgery

## 2019-01-26 DIAGNOSIS — Z01818 Encounter for other preprocedural examination: Secondary | ICD-10-CM | POA: Insufficient documentation

## 2019-01-26 DIAGNOSIS — Z20828 Contact with and (suspected) exposure to other viral communicable diseases: Secondary | ICD-10-CM | POA: Insufficient documentation

## 2019-01-26 HISTORY — DX: Headache, unspecified: R51.9

## 2019-01-26 HISTORY — DX: Gastro-esophageal reflux disease without esophagitis: K21.9

## 2019-01-26 HISTORY — DX: Unspecified osteoarthritis, unspecified site: M19.90

## 2019-01-26 LAB — BASIC METABOLIC PANEL
Anion gap: 9 (ref 5–15)
BUN: 12 mg/dL (ref 8–23)
CO2: 25 mmol/L (ref 22–32)
Calcium: 9.2 mg/dL (ref 8.9–10.3)
Chloride: 103 mmol/L (ref 98–111)
Creatinine, Ser: 0.6 mg/dL (ref 0.44–1.00)
GFR calc Af Amer: >60 mL/min (ref >60)
GFR calc non Af Amer: >60 mL/min (ref >60)
Glucose, Bld: 93 mg/dL (ref 70–99)
Potassium: 4.2 mmol/L (ref 3.5–5.1)
Sodium: 137 mmol/L (ref 135–145)

## 2019-01-26 LAB — CBC
HCT: 38.7 % (ref 36.0–46.0)
Hemoglobin: 12.8 g/dL (ref 12.0–15.0)
MCH: 30.7 pg (ref 26.0–34.0)
MCHC: 33.1 g/dL (ref 30.0–36.0)
MCV: 92.8 fL (ref 80.0–100.0)
Platelets: 398 10*3/uL (ref 150–400)
RBC: 4.17 MIL/uL (ref 3.87–5.11)
RDW: 13.7 % (ref 11.5–15.5)
WBC: 9.1 10*3/uL (ref 4.0–10.5)
nRBC: 0 % (ref 0.0–0.2)

## 2019-01-26 NOTE — Patient Instructions (Addendum)
Your procedure is scheduled on: 01-28-19 THURSDAY Report to Same Day Surgery 2nd floor medical mall [facility] Entrance-take elevator on left to 2nd floor.  Check in with surgery information desk.) To find out your arrival time please call [phone] between 1PM - 3PM on 01-27-19 [facility]  Remember: Instructions that are not followed completely may result in serious medical risk, up to and including death, or upon the discretion of your surgeon and anesthesiologist your surgery may need to be rescheduled.    _x___ 1. Do not eat food after midnight the night before your procedure. NO GUM OR CANDY AFTER MIDNIGHT. You may drink clear liquids up to 2 hours before you are scheduled to arrive at the hospital for your procedure.  Do not drink clear liquids within 2 hours of your scheduled arrival to the hospital.  Clear liquids include  --Water or Apple juice without pulp  --Gatorade  --Black Coffee or Clear Tea (No milk, no creamers, do not add anything to the coffee or Tea)   ____Ensure clear carbohydrate drink on the way to the hospital for bariatric patients  ____Ensure clear carbohydrate drink 3 hours before surgery.     __x__ 2. No Alcohol for 24 hours before or after surgery.   __x__3. No Smoking or e-cigarettes for 24 prior to surgery.  Do not use any chewable tobacco products for at least 6 hour prior to surgery   ____  4. Bring all medications with you on the day of surgery if instructed.    __x__ 5. Notify your doctor if there is any change in your medical condition     (cold, fever, infections).    x___6. On the morning of surgery brush your teeth with toothpaste and water.  You may rinse your mouth with mouth wash if you wish.  Do not swallow any toothpaste or mouthwash.   Do not wear jewelry, make-up, hairpins, clips or nail polish.  Do not wear lotions, powders, or perfumes. You may wear deodorant.  Do not shave 48 hours prior to surgery. Men may shave face and  neck.  Do not bring valuables to the hospital.    [facility] is not responsible for any belongings or valuables.               Contacts, dentures or bridgework may not be worn into surgery.  Leave your suitcase in the car. After surgery it may be brought to your room.  For patients admitted to the hospital, discharge time is determined by your treatment team.  _  Patients discharged the day of surgery will not be allowed to drive home.  You will need someone to drive you home and stay with you the night of your procedure.    Please read over the following fact sheets that you were given:   [facility] Preparing for Surgery  _x___ TAKE THE FOLLOWING MEDICATION THE MORNING OF SURGERY WITH A SMALL SIP OF WATER. These include:  1. PRILOSEC (OMEPRAZOLE)  2. TAKE AN EXTRA PRILOSEC THE NIGHT BEFORE YOUR SURGERY  3.  4.  5.  6.  ____Fleets enema or Magnesium Citrate as directed.   _x___ Use CHG Soap or sage wipes as directed on instruction sheet   ____ Use inhalers on the day of surgery and bring to hospital day of surgery  ____ Stop Metformin and Janumet 2 days prior to surgery.    ____ Take 1/2 of usual insulin dose the night before surgery and none on the morning  surgery.   _x___ Follow recommendations from Cardiologist, Pulmonologist or PCP regarding stopping Aspirin, Coumadin, Plavix ,Eliquis, Effient, or Pradaxa, and Pletal-ELIQUIS WAS STOPPED PER SURGEON'S OFFICE ON 01-25-19   X____Stop Anti-inflammatories such as Advil, Aleve, Ibuprofen, Motrin, Naproxen, Naprosyn, Goodies powders or aspirin products NOW-OK to take Tylenol OR HYDROCODONE IF NEEDED   ____ Stop supplements until after surgery.   ____ Bring C-Pap to the hospital.

## 2019-01-27 ENCOUNTER — Encounter: Payer: Self-pay | Admitting: *Deleted

## 2019-01-27 LAB — SARS CORONAVIRUS 2 (TAT 6-24 HRS): SARS Coronavirus 2: NEGATIVE

## 2019-01-28 ENCOUNTER — Encounter
Admission: RE | Disposition: A | Discharge status: Home / Self Care | Payer: Self-pay | Source: Home / Self Care | Attending: Orthopedic Surgery

## 2019-01-28 ENCOUNTER — Ambulatory Visit
Admission: RE | Admit: 2019-01-28 | Discharge: 2019-01-28 | Disposition: A | Discharge status: Home / Self Care | Payer: Medicare Other | Attending: Orthopedic Surgery | Admitting: Orthopedic Surgery

## 2019-01-28 ENCOUNTER — Ambulatory Visit: Payer: Medicare Other

## 2019-01-28 ENCOUNTER — Ambulatory Visit: Payer: Medicare Other | Admitting: Anesthesiology

## 2019-01-28 ENCOUNTER — Other Ambulatory Visit: Payer: Self-pay

## 2019-01-28 ENCOUNTER — Encounter: Payer: Self-pay | Admitting: *Deleted

## 2019-01-28 DIAGNOSIS — S22080A Wedge compression fracture of T11-T12 vertebra, initial encounter for closed fracture: Secondary | ICD-10-CM | POA: Diagnosis present

## 2019-01-28 DIAGNOSIS — Z8673 Personal history of transient ischemic attack (TIA), and cerebral infarction without residual deficits: Secondary | ICD-10-CM | POA: Insufficient documentation

## 2019-01-28 DIAGNOSIS — Z79899 Other long term (current) drug therapy: Secondary | ICD-10-CM | POA: Diagnosis not present

## 2019-01-28 DIAGNOSIS — M1990 Unspecified osteoarthritis, unspecified site: Secondary | ICD-10-CM | POA: Insufficient documentation

## 2019-01-28 DIAGNOSIS — X58XXXA Exposure to other specified factors, initial encounter: Secondary | ICD-10-CM | POA: Insufficient documentation

## 2019-01-28 DIAGNOSIS — F17200 Nicotine dependence, unspecified, uncomplicated: Secondary | ICD-10-CM | POA: Diagnosis not present

## 2019-01-28 DIAGNOSIS — Z7901 Long term (current) use of anticoagulants: Secondary | ICD-10-CM | POA: Diagnosis not present

## 2019-01-28 DIAGNOSIS — K219 Gastro-esophageal reflux disease without esophagitis: Secondary | ICD-10-CM | POA: Insufficient documentation

## 2019-01-28 DIAGNOSIS — Z419 Encounter for procedure for purposes other than remedying health state, unspecified: Secondary | ICD-10-CM

## 2019-01-28 HISTORY — PX: KYPHOPLASTY: SHX5884

## 2019-01-28 IMAGING — XA DG THORACIC SPINE 2V
2 series · 2 of 2 positions shown · non-contrast
Comparison: MRI dated [DATE]

CLINICAL DATA: Compression fracture of T12.

EXAM:
THORACIC SPINE 2 VIEWS; DG C-ARM 1-60 MIN

[Series 4: ortho standard · 1 of 1 slices shown]
[im 1/1]
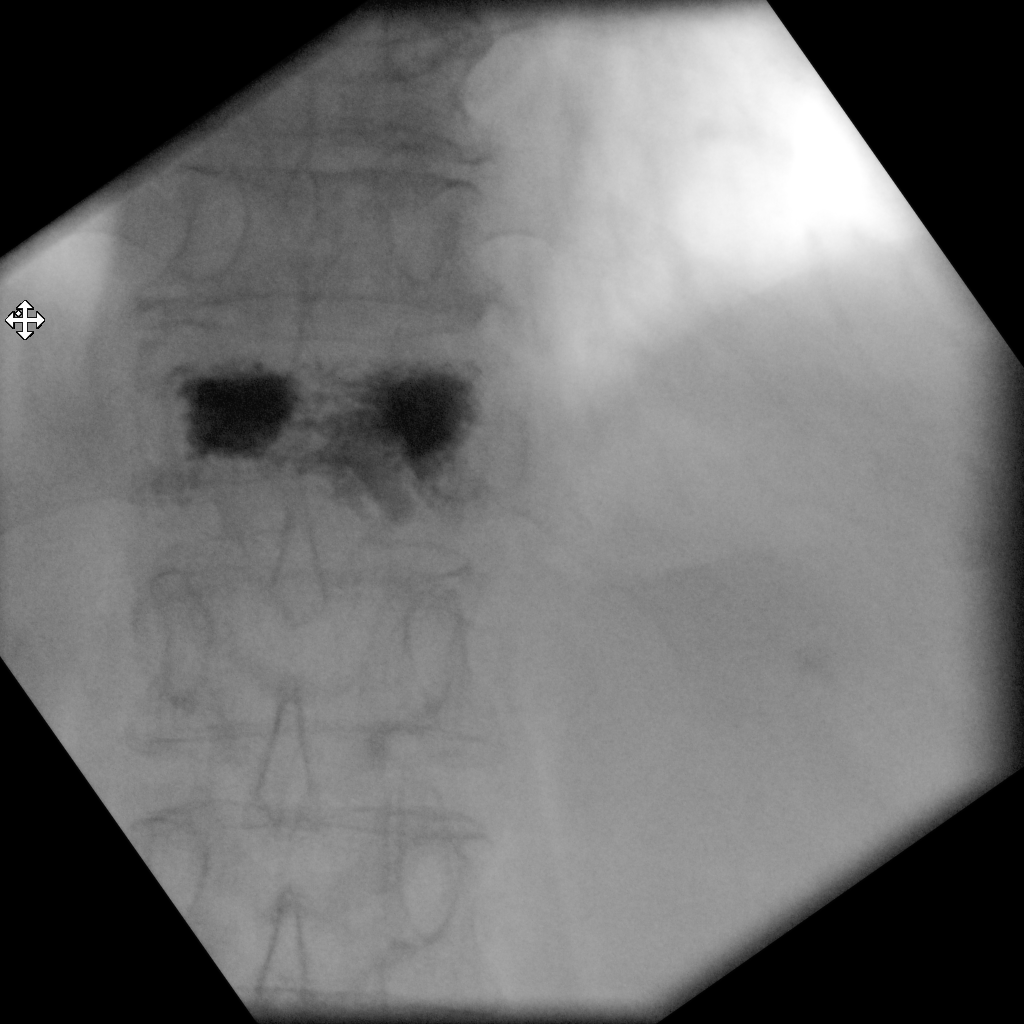

[Series 6001: m1 · 1 of 1 slices shown]
[im 1/1]
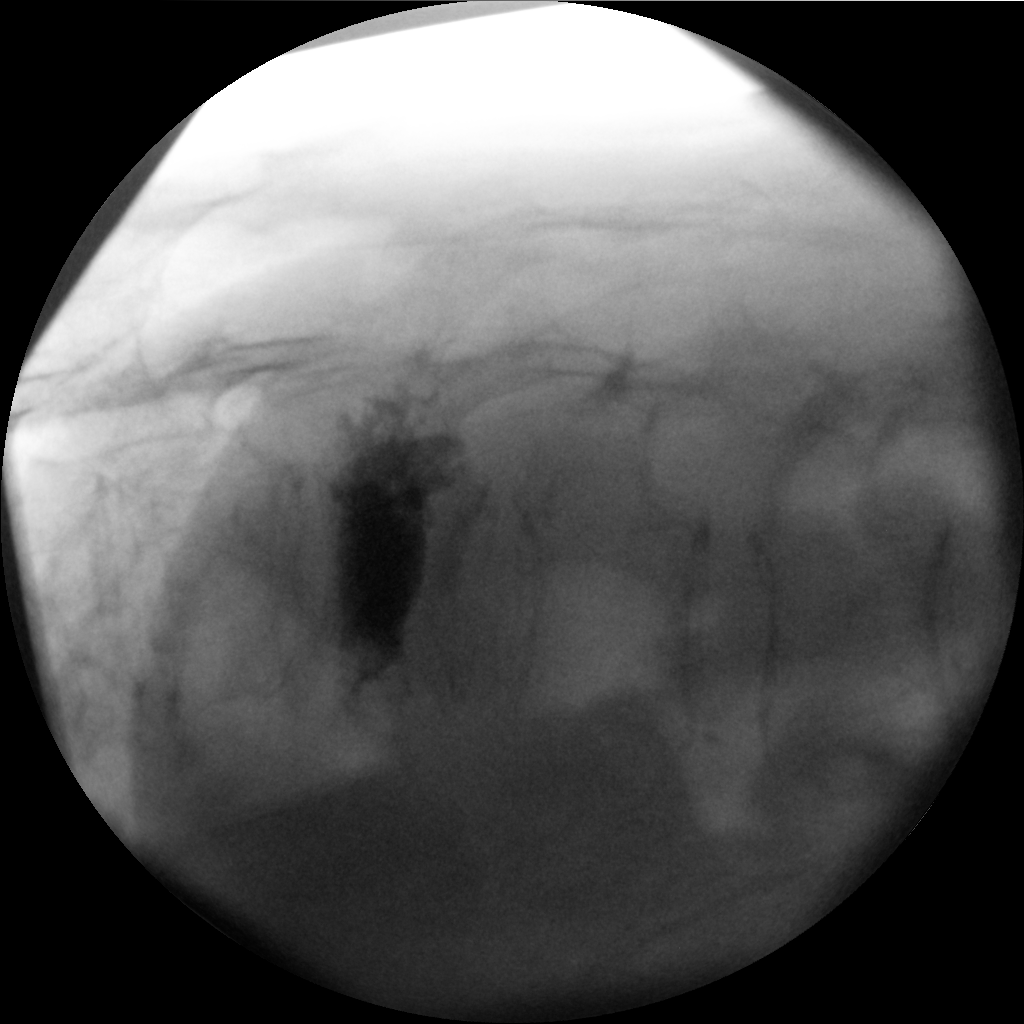

[2 of 2 positions shown; findings below may reference images not displayed]

FINDINGS: AP and lateral C-arm images demonstrate the patient has undergone
vertebroplasty at T12.
IMPRESSION: Vertebroplasty performed at T12.

FLUOROSCOPY TIME:  2 minutes 12 seconds

C-arm fluoroscopic images were obtained intraoperatively and
submitted for post operative interpretation.

## 2019-01-28 IMAGING — CR DG C-ARM 1-60 MIN
2 series · 2 of 2 positions shown · non-contrast
Comparison: MRI dated [DATE]

CLINICAL DATA: Compression fracture of T12.

EXAM:
THORACIC SPINE 2 VIEWS; DG C-ARM 1-60 MIN

[ortho standard]
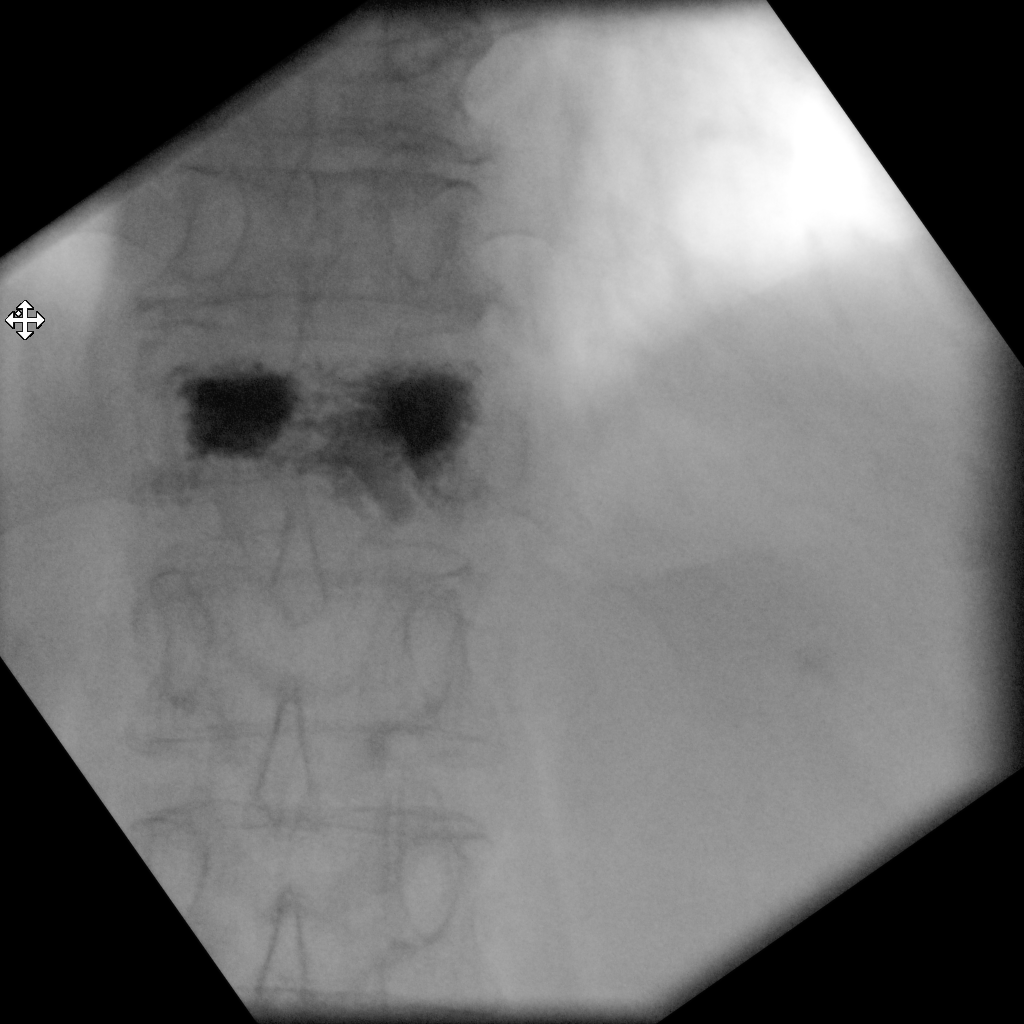

[m1]
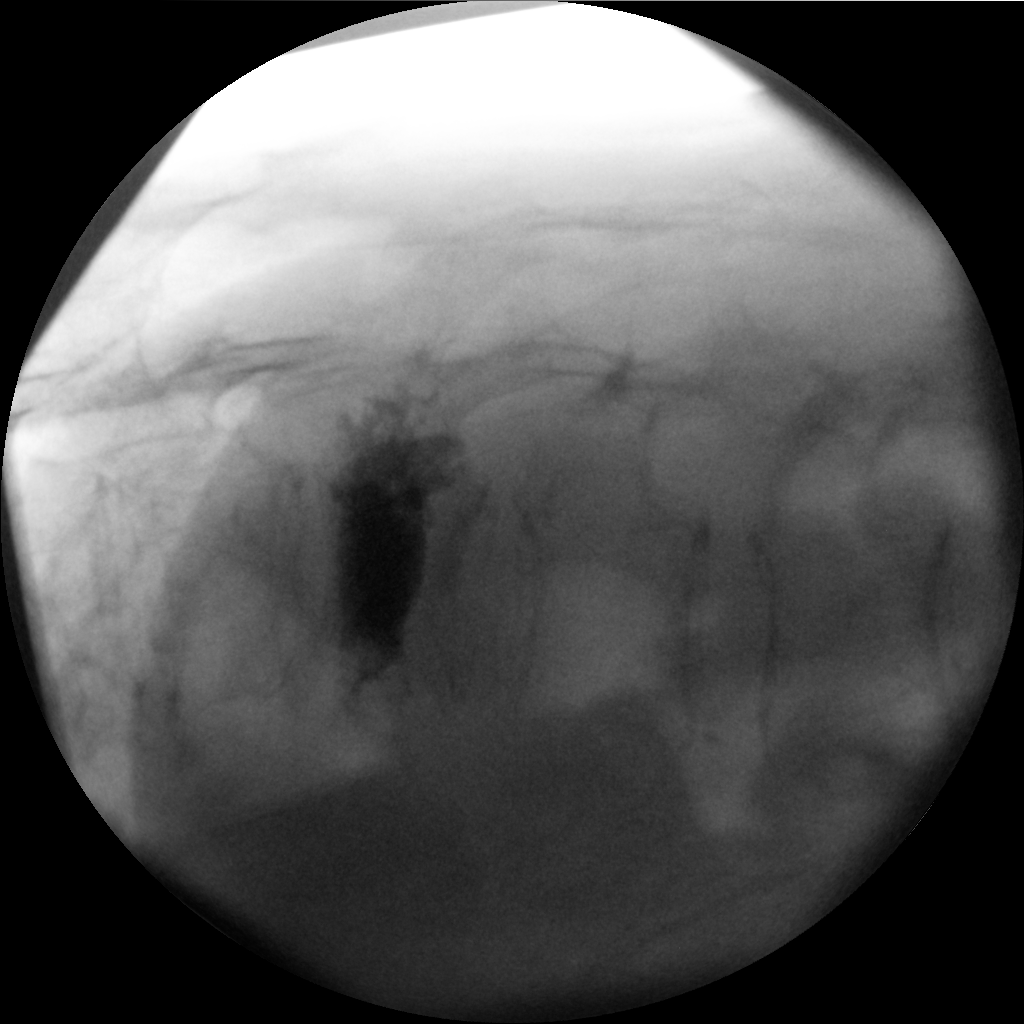

[2 of 2 positions shown; findings below may reference images not displayed]

FINDINGS: AP and lateral C-arm images demonstrate the patient has undergone
vertebroplasty at T12.
IMPRESSION: Vertebroplasty performed at T12.

FLUOROSCOPY TIME:  2 minutes 12 seconds

C-arm fluoroscopic images were obtained intraoperatively and
submitted for post operative interpretation.

## 2019-01-28 SURGERY — KYPHOPLASTY
Anesthesia: General

## 2019-01-28 MED ORDER — LIDOCAINE HCL (PF) 1 % IJ SOLN
INTRAMUSCULAR | Status: AC
  Filled 2019-01-28: qty 60

## 2019-01-28 MED ORDER — METOCLOPRAMIDE HCL 10 MG PO TABS: 5.0000 mg | ORAL_TABLET | Freq: Three times a day (TID) | ORAL | Status: DC | PRN

## 2019-01-28 MED ORDER — MIDAZOLAM HCL 2 MG/2ML IJ SOLN
INTRAMUSCULAR | Status: AC
  Filled 2019-01-28: qty 2

## 2019-01-28 MED ORDER — HYDROCODONE-ACETAMINOPHEN 5-325 MG PO TABS: 1.0000 | ORAL_TABLET | Freq: Four times a day (QID) | ORAL | 0 refills | Status: AC | PRN

## 2019-01-28 MED ORDER — SODIUM CHLORIDE 0.9 % IV SOLN: INTRAVENOUS | Status: DC

## 2019-01-28 MED ORDER — ACETAMINOPHEN 325 MG PO TABS: 325.0000 mg | ORAL_TABLET | Freq: Four times a day (QID) | ORAL | Status: DC | PRN

## 2019-01-28 MED ORDER — FENTANYL CITRATE (PF) 100 MCG/2ML IJ SOLN
INTRAMUSCULAR | Status: AC
  Filled 2019-01-28: qty 2

## 2019-01-28 MED ORDER — MORPHINE SULFATE (PF) 4 MG/ML IV SOLN: 0.5000 mg | INTRAVENOUS | Status: DC | PRN

## 2019-01-28 MED ORDER — LIDOCAINE HCL 1 % IJ SOLN
INTRAMUSCULAR | Status: DC | PRN
  Administered 2019-01-28: 30 mL

## 2019-01-28 MED ORDER — CEFAZOLIN SODIUM-DEXTROSE 1-4 GM/50ML-% IV SOLN
INTRAVENOUS | Status: AC
  Filled 2019-01-28: qty 50

## 2019-01-28 MED ORDER — IOHEXOL 180 MG/ML  SOLN
INTRAMUSCULAR | Status: DC | PRN
  Administered 2019-01-28: 50 mL

## 2019-01-28 MED ORDER — METOCLOPRAMIDE HCL 5 MG/ML IJ SOLN: 5.0000 mg | Freq: Three times a day (TID) | INTRAMUSCULAR | Status: DC | PRN

## 2019-01-28 MED ORDER — PROPOFOL 500 MG/50ML IV EMUL
INTRAVENOUS | Status: DC | PRN
  Administered 2019-01-28: 50 ug/kg/min via INTRAVENOUS

## 2019-01-28 MED ORDER — TRAMADOL HCL 50 MG PO TABS: 50.0000 mg | ORAL_TABLET | Freq: Four times a day (QID) | ORAL | Status: DC

## 2019-01-28 MED ORDER — HYDROCODONE-ACETAMINOPHEN 7.5-325 MG PO TABS: 1.0000 | ORAL_TABLET | ORAL | Status: DC | PRN

## 2019-01-28 MED ORDER — CEFAZOLIN SODIUM-DEXTROSE 1-4 GM/50ML-% IV SOLN
1.0000 g | Freq: Once | INTRAVENOUS | Status: AC
  Administered 2019-01-28: 1 g via INTRAVENOUS

## 2019-01-28 MED ORDER — FENTANYL CITRATE (PF) 100 MCG/2ML IJ SOLN
INTRAMUSCULAR | Status: DC | PRN
  Administered 2019-01-28: 50 ug via INTRAVENOUS
  Administered 2019-01-28 (×2): 25 ug via INTRAVENOUS

## 2019-01-28 MED ORDER — FENTANYL CITRATE (PF) 100 MCG/2ML IJ SOLN: 25.0000 ug | INTRAMUSCULAR | Status: DC | PRN

## 2019-01-28 MED ORDER — LIDOCAINE HCL (PF) 2 % IJ SOLN
INTRAMUSCULAR | Status: DC | PRN
  Administered 2019-01-28: 40 mg via INTRADERMAL

## 2019-01-28 MED ORDER — BUPIVACAINE-EPINEPHRINE (PF) 0.5% -1:200000 IJ SOLN
INTRAMUSCULAR | Status: DC | PRN
  Administered 2019-01-28: 20 mL via PERINEURAL

## 2019-01-28 MED ORDER — ONDANSETRON HCL 4 MG/2ML IJ SOLN: 4.0000 mg | Freq: Four times a day (QID) | INTRAMUSCULAR | Status: DC | PRN

## 2019-01-28 MED ORDER — MIDAZOLAM HCL 2 MG/2ML IJ SOLN
INTRAMUSCULAR | Status: DC | PRN
  Administered 2019-01-28 (×2): 1 mg via INTRAVENOUS

## 2019-01-28 MED ORDER — BUPIVACAINE-EPINEPHRINE (PF) 0.5% -1:200000 IJ SOLN
INTRAMUSCULAR | Status: AC
  Filled 2019-01-28: qty 30

## 2019-01-28 MED ORDER — ONDANSETRON HCL 4 MG PO TABS: 4.0000 mg | ORAL_TABLET | Freq: Four times a day (QID) | ORAL | Status: DC | PRN

## 2019-01-28 MED ORDER — ONDANSETRON HCL 4 MG/2ML IJ SOLN: 4.0000 mg | Freq: Once | INTRAMUSCULAR | Status: DC | PRN

## 2019-01-28 MED ORDER — HYDROCODONE-ACETAMINOPHEN 5-325 MG PO TABS: 1.0000 | ORAL_TABLET | ORAL | Status: DC | PRN

## 2019-01-28 MED ORDER — PROPOFOL 500 MG/50ML IV EMUL
INTRAVENOUS | Status: AC
  Filled 2019-01-28: qty 50

## 2019-01-28 MED ORDER — LACTATED RINGERS IV SOLN
INTRAVENOUS | Status: DC
  Administered 2019-01-28: 14:00:00 via INTRAVENOUS

## 2019-01-28 SURGICAL SUPPLY — 21 items
CEMENT KYPHON CX01A KIT/MIXER (Cement) ×3 IMPLANT
COVER WAND RF STERILE (DRAPES) ×3 IMPLANT
DERMABOND ADVANCED (GAUZE/BANDAGES/DRESSINGS) ×2
DERMABOND ADVANCED .7 DNX12 (GAUZE/BANDAGES/DRESSINGS) ×1 IMPLANT
DEVICE BIOPSY BONE KYPHX (INSTRUMENTS) ×3 IMPLANT
DRAPE C-ARM XRAY 36X54 (DRAPES) ×3 IMPLANT
DURAPREP 26ML APPLICATOR (WOUND CARE) ×3 IMPLANT
FEE RENTAL RFA GENERATOR (MISCELLANEOUS) IMPLANT
GLOVE SURG SYN 9.0  PF PI (GLOVE) ×2
GLOVE SURG SYN 9.0 PF PI (GLOVE) ×1 IMPLANT
GOWN SRG 2XL LVL 4 RGLN SLV (GOWNS) ×1 IMPLANT
GOWN STRL NON-REIN 2XL LVL4 (GOWNS) ×2
GOWN STRL REUS W/ TWL LRG LVL3 (GOWN DISPOSABLE) ×1 IMPLANT
GOWN STRL REUS W/TWL LRG LVL3 (GOWN DISPOSABLE) ×2
PACK KYPHOPLASTY (MISCELLANEOUS) ×3 IMPLANT
RENTAL RFA  GENERATOR (MISCELLANEOUS)
RENTAL RFA GENERATOR (MISCELLANEOUS) IMPLANT
STRAP SAFETY 5IN WIDE (MISCELLANEOUS) ×3 IMPLANT
TRAY KYPHOPAK 15/2 EXPRESS (KITS) ×2 IMPLANT
TRAY KYPHOPAK 15/3 EXPRESS 1ST (MISCELLANEOUS) ×1 IMPLANT
TRAY KYPHOPAK 20/3 EXPRESS 1ST (MISCELLANEOUS) ×1 IMPLANT

## 2019-01-28 NOTE — Anesthesia Preprocedure Evaluation (Signed)
Anesthesia Evaluation  Patient identified by MRN, date of birth, ID band Patient awake    Reviewed: Allergy & Precautions, H&P , NPO status , Patient's Chart, lab work & pertinent test results, reviewed documented beta blocker date and time   Airway Mallampati: I  TM Distance: >3 FB Neck ROM: full    Dental  (+) Dental Advidsory Given, Partial Upper, Missing, Poor Dentition   Pulmonary neg shortness of breath, neg COPD, neg recent URI, Current Smoker and Patient abstained from smoking.,    Pulmonary exam normal        Cardiovascular Exercise Tolerance: Good negative cardio ROS Normal cardiovascular exam     Neuro/Psych neg Seizures CVA, No Residual Symptoms negative psych ROS   GI/Hepatic Neg liver ROS, GERD  ,  Endo/Other  negative endocrine ROS  Renal/GU negative Renal ROS  negative genitourinary   Musculoskeletal   Abdominal   Peds  Hematology negative hematology ROS (+)   Anesthesia Other Findings Past Medical History: No date: Arthritis No date: GERD (gastroesophageal reflux disease) No date: Headache     Comment:  severe headaches 06/2018: Stroke [facility])     Comment:  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-no               residual effedcts-this happened in [place]-Pt just moved to              [place] 2 weeks ago (October 2020)   Reproductive/Obstetrics negative OB ROS                             Anesthesia Physical Anesthesia Plan  ASA: II  Anesthesia Plan: General   Post-op Pain Management:    Induction: Intravenous  PONV Risk Score and Plan: 2 and Propofol infusion and TIVA  Airway Management Planned: Natural Airway and Nasal Cannula  Additional Equipment:   Intra-op Plan:   Post-operative Plan:   Informed Consent: I have reviewed the patients History and Physical, chart, labs and discussed the procedure including the risks, benefits and alternatives for  the proposed anesthesia with the patient or authorized representative who has indicated his/her understanding and acceptance.     Dental Advisory Given  Plan Discussed with: Anesthesiologist, CRNA and Surgeon  Anesthesia Plan Comments:         Anesthesia Quick Evaluation

## 2019-01-28 NOTE — Discharge Instructions (Addendum)
Try to take it easy today and tomorrow and then resume more normal activities on Saturday.  Try not to lift anything over 5 pounds for 2 weeks.  Remove Band-Aids on Saturday then okay to shower.  Pain medicine as directed. You can start your Eliquis tomorrow 10/16 in the AM. Call office if you are having problems.  AMBULATORY SURGERY  DISCHARGE INSTRUCTIONS   1) The drugs that you were given will stay in your system until tomorrow so for the next 24 hours you should not:  A) Drive an automobile B) Make any legal decisions C) Drink any alcoholic beverage   2) You may resume regular meals tomorrow.  Today it is better to start with liquids and gradually work up to solid foods.  You may eat anything you prefer, but it is better to start with liquids, then soup and crackers, and gradually work up to solid foods.   3) Please notify your doctor immediately if you have any unusual bleeding, trouble breathing, redness and pain at the surgery site, drainage, fever, or pain not relieved by medication.    4) Additional Instructions:        Please contact your physician with any problems or Same Day Surgery at [phone], Monday through Friday 6 am to 4 pm, or [facility] number at [phone].

## 2019-01-28 NOTE — H&P (Signed)
Reviewed paper H+P, will be scanned into chart. No changes noted.  

## 2019-01-28 NOTE — Op Note (Signed)
Date January 28, 2019  time 3:40 PM   PATIENT:  Anne Hardy   PRE-OPERATIVE DIAGNOSIS:  closed wedge compression fracture of T12   POST-OPERATIVE DIAGNOSIS:  closed wedge compression fracture of T12   PROCEDURE:  Procedure(s): KYPHOPLASTY T12  SURGEON: Laurene Footman, MD   ASSISTANTS: None   ANESTHESIA:   local and MAC   EBL:  No intake/output data recorded.   BLOOD ADMINISTERED:none   DRAINS: none    LOCAL MEDICATIONS USED:  MARCAINE    and XYLOCAINE    SPECIMEN:   T12 vertebral body biopsy   DISPOSITION OF SPECIMEN:  Pathology   COUNTS:  YES   TOURNIQUET:  * No tourniquets in log *   IMPLANTS: Bone cement   DICTATION: .Dragon Dictation  patient was brought to the operating room and after adequate anesthesia was obtained the patient was placed prone.  C arm was brought in in good visualization of the affected level obtained on both AP and lateral projections.  After patient identification and timeout procedures were completed, local anesthetic was infiltrated with 10 cc 1% Xylocaine infiltrated subcutaneously.  This is done the area on the both sides of the planned approach.  The back was then prepped and draped in the usual sterile manner and repeat timeout procedure carried out.  A spinal needle was brought down to the pedicle on the each side of  T12 and a 50-50 mix of 1% Xylocaine half percent Sensorcaine with epinephrine total of 20 cc injected on each side.  After allowing this to set a small incision was made and the trocar was advanced into the vertebral body in an transpedicular fashion.  Biopsy was obtained Drilling was carried out balloon inserted with inflation to  2 cc on the left and 2-1/2 on the right.  When the cement was appropriate consistency approximately 4-3/4 cc were injected into the vertebral body without extravasation, good fill superior to inferior endplates and from right to left sides along the inferior endplate.  After the cement had set the  trochar was removed and permanent C-arm views obtained.  The wound was closed with Dermabond followed by Band-Aid   PLAN OF CARE: Discharge to home after PACU   PATIENT DISPOSITION:  PACU - hemodynamically stable.

## 2019-01-28 NOTE — Anesthesia Procedure Notes (Signed)
Date/Time: 01/28/2019 2:47 PM Performed by: Nelda Marseille, CRNA Pre-anesthesia Checklist: Patient identified, Emergency Drugs available, Suction available, Patient being monitored and Timeout performed Oxygen Delivery Method: Nasal cannula

## 2019-01-28 NOTE — Anesthesia Post-op Follow-up Note (Signed)
Anesthesia QCDR form completed.        

## 2019-01-28 NOTE — Transfer of Care (Signed)
Immediate Anesthesia Transfer of Care Note  Patient: Anne Hardy  Procedure(s) Performed: T12 KYPHOPLASTY (N/A )  Patient Location: PACU  Anesthesia Type:General  Level of Consciousness: awake, alert  and oriented  Airway & Oxygen Therapy: Patient Spontanous Breathing and Patient connected to nasal cannula oxygen  Post-op Assessment: Report given to RN and Post -op Vital signs reviewed and stable  Post vital signs: Reviewed and stable  Last Vitals:  Vitals Value Taken Time  BP    Temp    Pulse    Resp    SpO2      Last Pain:  Vitals:   01/28/19 1333  TempSrc: Tympanic  PainSc: 5       Patients Stated Pain Goal: 0 (70/[id number]/37 [id number])  Complications: No apparent anesthesia complications

## 2019-01-29 ENCOUNTER — Encounter: Payer: Self-pay | Admitting: Orthopedic Surgery

## 2019-01-29 NOTE — Anesthesia Postprocedure Evaluation (Signed)
Anesthesia Post Note  Patient: Anne Hardy  Procedure(s) Performed: T12 KYPHOPLASTY (N/A )  Patient location during evaluation: PACU Anesthesia Type: General Level of consciousness: awake and alert Pain management: pain level controlled Vital Signs Assessment: post-procedure vital signs reviewed and stable Respiratory status: spontaneous breathing, nonlabored ventilation, respiratory function stable and patient connected to nasal cannula oxygen Cardiovascular status: blood pressure returned to baseline and stable Postop Assessment: no apparent nausea or vomiting Anesthetic complications: no     Last Vitals:  Vitals:   01/28/19 1557 01/28/19 1612  BP: 122/86 117/75  Pulse: 82 74  Resp: 19 20  Temp: 36.8 C 36.4 C  SpO2: 98% 96%    Last Pain:  Vitals:   01/28/19 1612  TempSrc: Temporal  PainSc: 2                  Martha Clan

## 2019-02-01 LAB — SURGICAL PATHOLOGY

## 2019-02-04 ENCOUNTER — Other Ambulatory Visit: Payer: Self-pay

## 2019-02-04 ENCOUNTER — Ambulatory Visit
Admission: RE | Admit: 2019-02-04 | Discharge: 2019-02-04 | Disposition: A | Discharge status: Home / Self Care | Payer: Medicare Other | Source: Ambulatory Visit | Attending: Orthopedic Surgery | Admitting: Orthopedic Surgery

## 2019-02-04 DIAGNOSIS — M25561 Pain in right knee: Secondary | ICD-10-CM | POA: Diagnosis not present

## 2019-02-04 DIAGNOSIS — G8929 Other chronic pain: Secondary | ICD-10-CM | POA: Insufficient documentation

## 2019-02-04 IMAGING — MR MR KNEE*R* W/O CM
6 series · 40 of 40 positions shown · non-contrast
Comparison: None.

CLINICAL DATA: Right knee pain.  Pain with walking.

EXAM:
MRI OF THE RIGHT KNEE WITHOUT CONTRAST
TECHNIQUE: Multiplanar, multisequence MR imaging of the knee was performed. No
intravenous contrast was administered.

[Series 22: T2 fat-sat · axial · right · 4.0mm · 0.50mm/px · z∈[-65,+58]mm · 5 of 26 slices shown (1 of 3)]
[im 1/26]
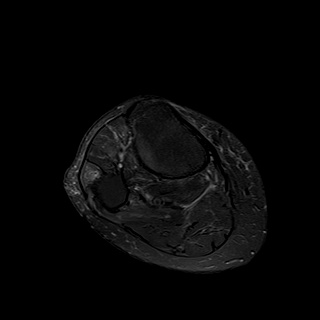
[im 7/26]
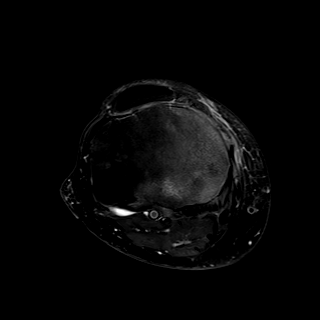
[im 13/26]
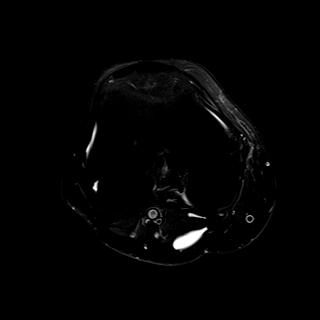
[im 19/26]
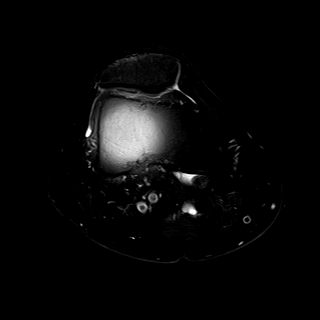
[im 26/26]
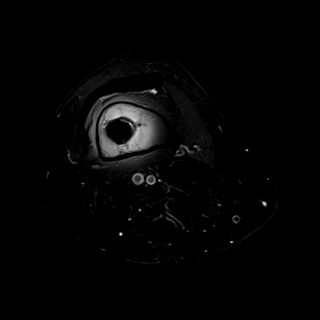

[Series 23: T2 fat-sat · coronal · right · 4.0mm · 0.59mm/px · 6 of 25 slices shown (2 of 3)]
[im 1/25]
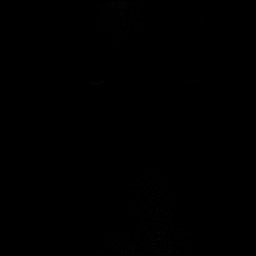
[im 5/25]
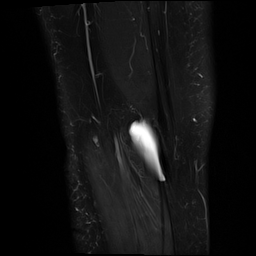
[im 10/25]
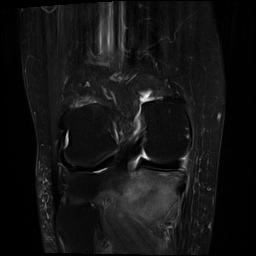
[im 15/25]
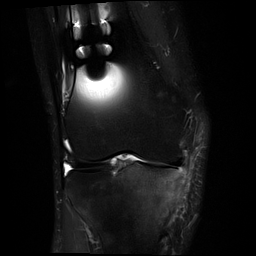
[im 20/25]
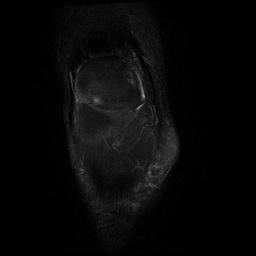
[im 25/25]
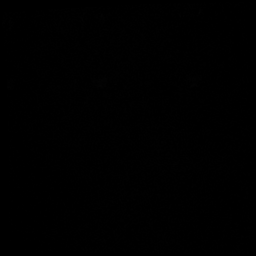

[Series 24: T1 · coronal · right · 4.0mm · 0.59mm/px · 6 of 27 slices shown]
[im 1/27]
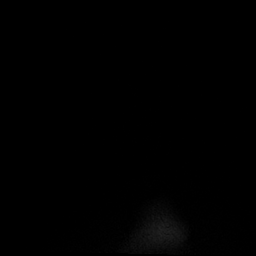
[im 6/27]
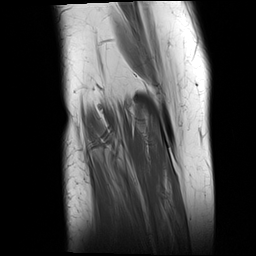
[im 11/27]
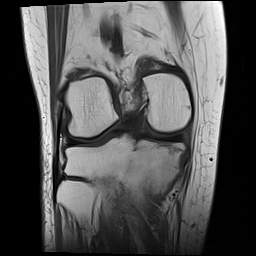
[im 16/27]
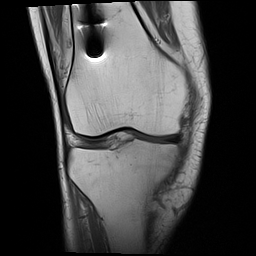
[im 21/27]
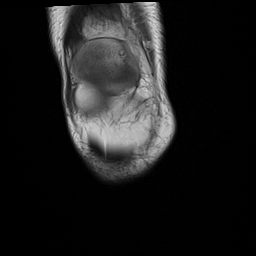
[im 27/27]
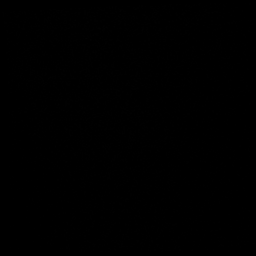

[Series 25: PD fat-sat · coronal · right · 4.0mm · 0.59mm/px · 7 of 30 slices shown (1 of 2)]
[im 1/30]
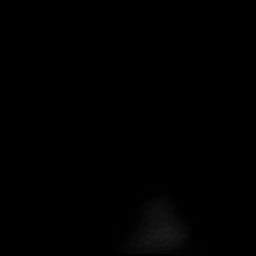
[im 5/30]
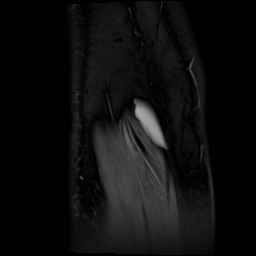
[im 10/30]
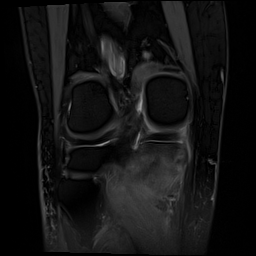
[im 15/30]
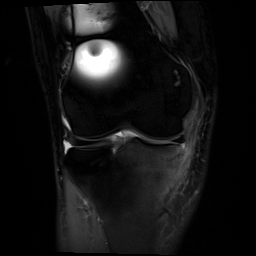
[im 20/30]
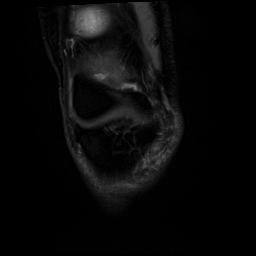
[im 25/30]
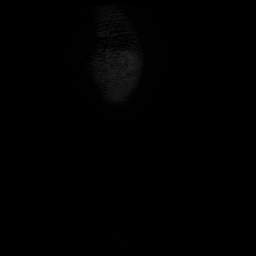
[im 30/30]
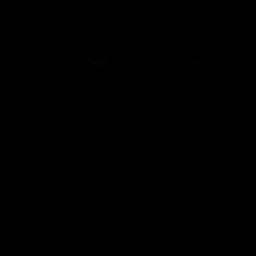

[Series 26: PD fat-sat · sagittal · right · 3.0mm · 0.59mm/px · 8 of 34 slices shown (2 of 2)]
[im 1/34]
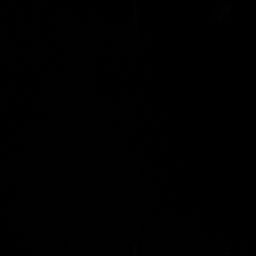
[im 5/34]
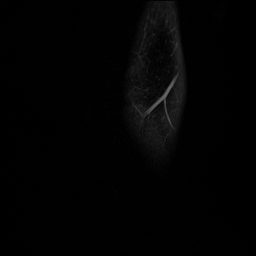
[im 10/34]
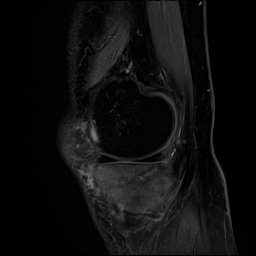
[im 15/34]
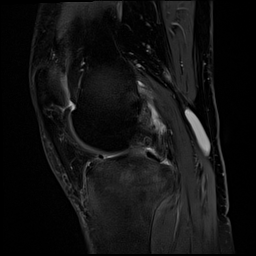
[im 19/34]
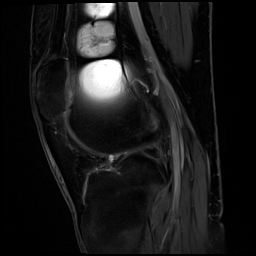
[im 24/34]
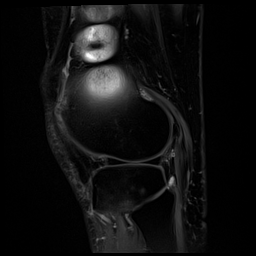
[im 29/34]
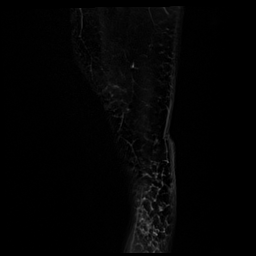
[im 34/34]
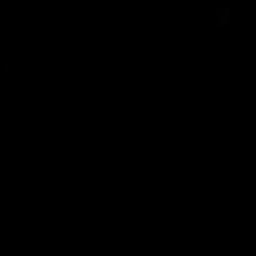

[Series 27: T2 fat-sat · sagittal · right · 3.0mm · 0.59mm/px · 8 of 36 slices shown (3 of 3)]
[im 1/36]
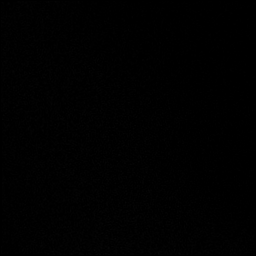
[im 6/36]
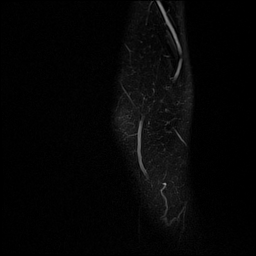
[im 11/36]
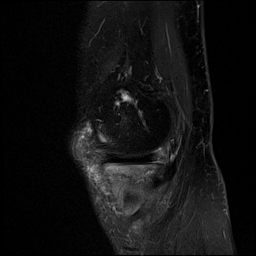
[im 16/36]
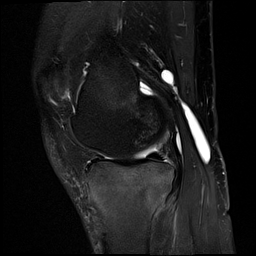
[im 21/36]
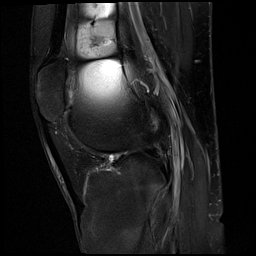
[im 26/36]
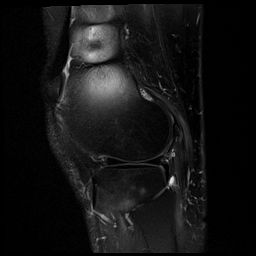
[im 31/36]
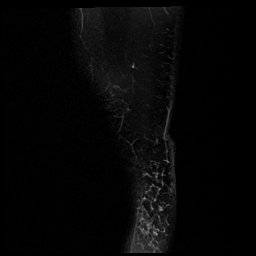
[im 36/36]
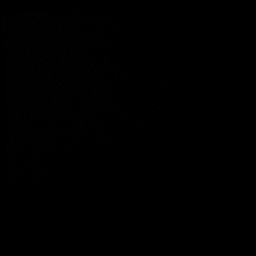

[40 of 40 positions shown; findings below may reference images not displayed]

FINDINGS: MENISCI

Medial meniscus:  Intact.

Lateral meniscus:  Intact.

LIGAMENTS

Cruciates:  Intact ACL and PCL.

Collaterals: Intact lateral collateral ligament complex. Mild
thickening of the medial collateral ligament with edema superficial
to the MCL most concerning for mild MCL strain.

CARTILAGE

Patellofemoral: Mild partial-thickness cartilage loss of the lateral
patellofemoral compartment.

Medial:  No chondral defect.

Lateral:  No chondral defect.

Joint:  No joint effusion. Normal Hoffa's fat. No plical thickening.

Popliteal Fossa:  Small Baker's cyst.  Intact popliteus tendon.

Extensor Mechanism: Intact quadriceps tendon. Intact patellar
tendon. Intact medial patellar retinaculum. Intact lateral patellar
retinaculum. Intact MPFL.

Bones: Marrow edema in the medial tibial plateau with a subtle
serpiginous horizontal linear signal abnormality most concerning for
a nondisplaced fracture. No other fracture or dislocation. No
aggressive osseous lesion.

Other: No fluid collection or hematoma.  Muscles are normal.
IMPRESSION: 1. Nondisplaced medial tibial plateau fracture with severe
surrounding marrow edema. Fracture does not involve the articular
surface.
2. Mild MCL strain without a tear.

## 2019-06-10 ENCOUNTER — Other Ambulatory Visit: Payer: Self-pay | Admitting: Gerontology

## 2019-06-10 DIAGNOSIS — R519 Headache, unspecified: Secondary | ICD-10-CM

## 2019-06-22 ENCOUNTER — Other Ambulatory Visit: Payer: Self-pay

## 2019-06-22 ENCOUNTER — Ambulatory Visit
Admission: RE | Admit: 2019-06-22 | Discharge: 2019-06-22 | Disposition: A | Discharge status: Home / Self Care | Payer: Medicare Other | Source: Ambulatory Visit | Attending: Gerontology | Admitting: Gerontology

## 2019-06-22 DIAGNOSIS — R519 Headache, unspecified: Secondary | ICD-10-CM | POA: Diagnosis present

## 2019-06-22 DIAGNOSIS — G8929 Other chronic pain: Secondary | ICD-10-CM | POA: Insufficient documentation

## 2019-06-22 IMAGING — MR MR HEAD WO/W CM
14 series · 48 of 48 positions shown · IV contrast (4ml Gadavist)
Comparison: None.

CLINICAL DATA: Frontal and left posterior headaches occurring daily
since [REDACTED]. Family history of glioblastoma.

EXAM:
MRI HEAD WITHOUT AND WITH CONTRAST
TECHNIQUE: Multiplanar, multiecho pulse sequences of the brain and surrounding
structures were obtained without and with intravenous contrast.
CONTRAST:  4mL GADAVIST GADOBUTROL 1 MMOL/ML IV SOLN

[Series 5: ax dwi_tracew · axial · 3.0mm · 0.60mm/px · z∈[-116,+39]mm · 3 of 48 slices shown]
[im 1/48]
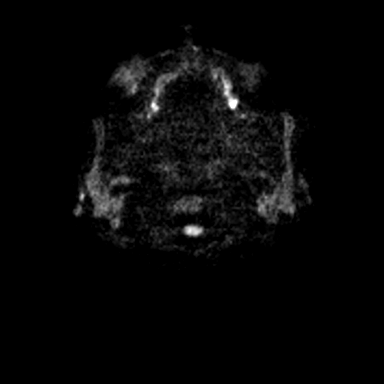
[im 24/48]
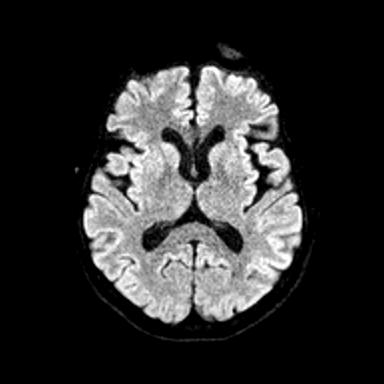
[im 48/48]
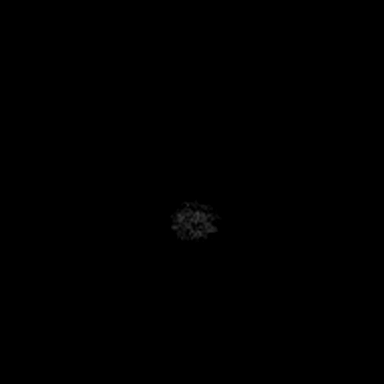

[Series 6: ax dwi_adc · axial · 3.0mm · 0.60mm/px · z∈[-116,+39]mm · 3 of 48 slices shown]
[im 1/48]
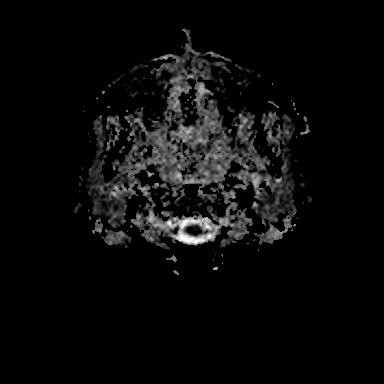
[im 24/48]
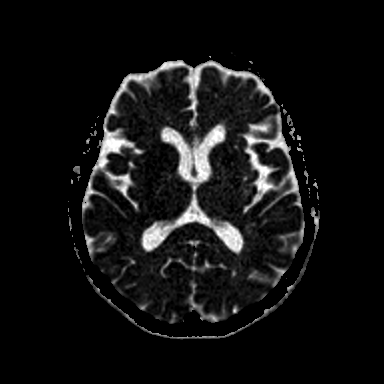
[im 48/48]
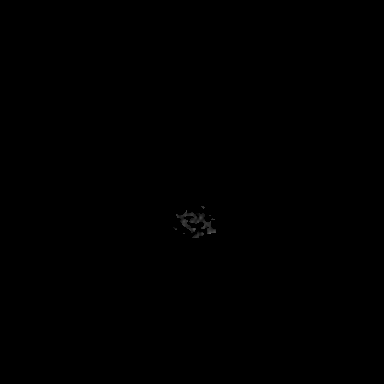

[Series 7: T1 · sagittal · 5.0mm · 0.62mm/px · 1 of 21 slices shown (1 of 2)]
[im 1/21]
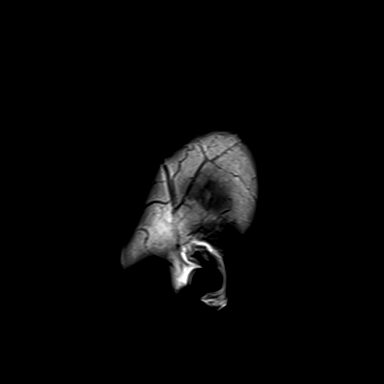

[Series 8: cor dwi_tracew · coronal · 5.0mm · 0.60mm/px · 2 of 38 slices shown]
[im 1/38]
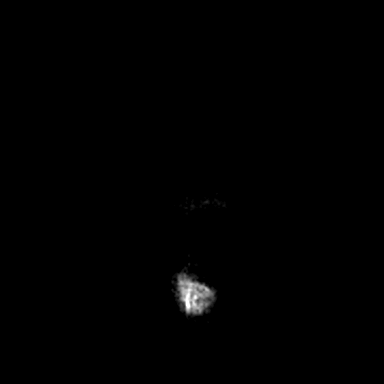
[im 38/38]
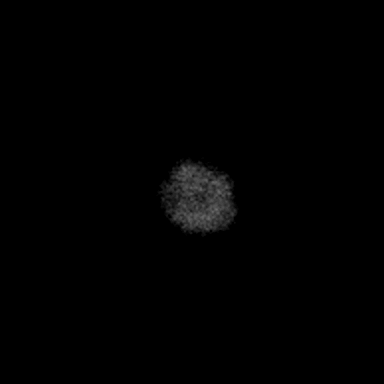

[Series 9: cor dwi_adc · coronal · 5.0mm · 0.60mm/px · 2 of 37 slices shown]
[im 1/37]
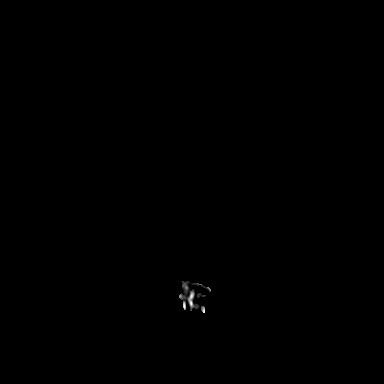
[im 37/37]
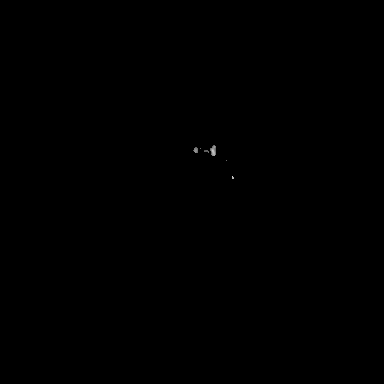

[Series 10: T2 · axial · 5.0mm · 0.53mm/px · 1 of 25 slices shown]
[im 1/25]
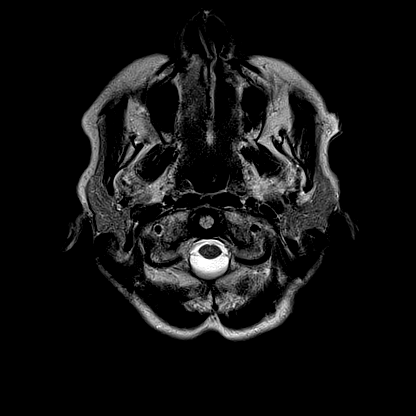

[Series 11: mag_images · axial · 3.0mm · 0.90mm/px · z∈[-127,+50]mm · 3 of 60 slices shown]
[im 1/60]
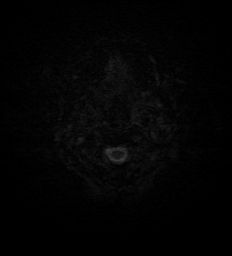
[im 30/60]
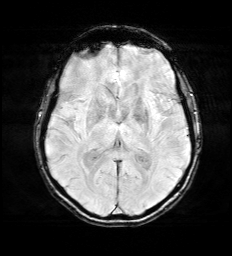
[im 60/60]
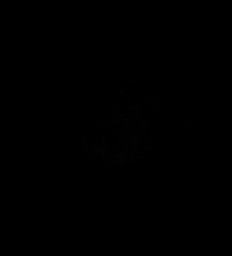

[Series 12: pha_images · axial · 3.0mm · 0.90mm/px · z∈[-127,+50]mm · 3 of 58 slices shown]
[im 1/58]
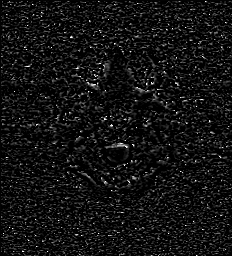
[im 29/58]
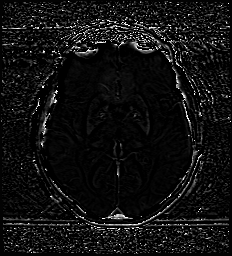
[im 58/58]
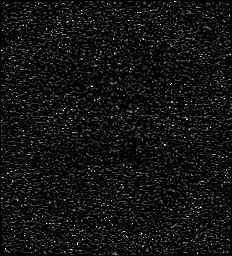

[Series 13: swi_images · axial · 3.0mm · 0.90mm/px · z∈[-127,+50]mm · 3 of 60 slices shown]
[im 1/60]
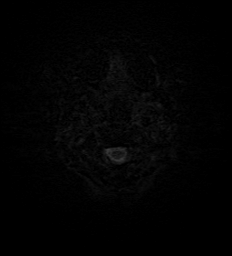
[im 30/60]
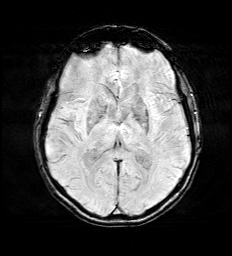
[im 60/60]
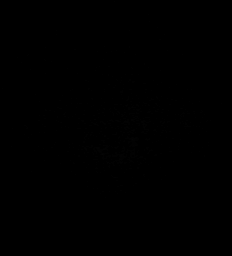

[Series 15: FLAIR · axial · 3.0mm · 0.53mm/px · z∈[-119,+43]mm · 3 of 55 slices shown]
[im 1/55]
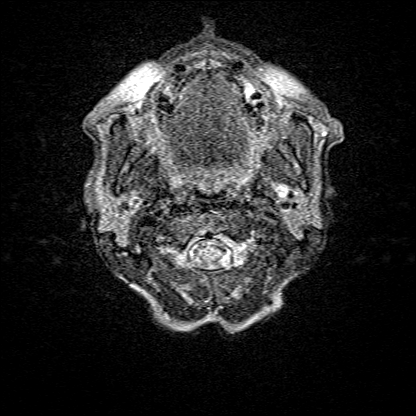
[im 28/55]
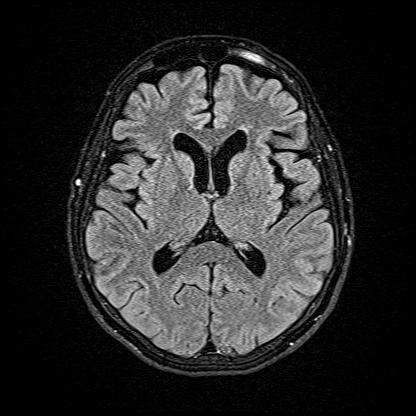
[im 55/55]
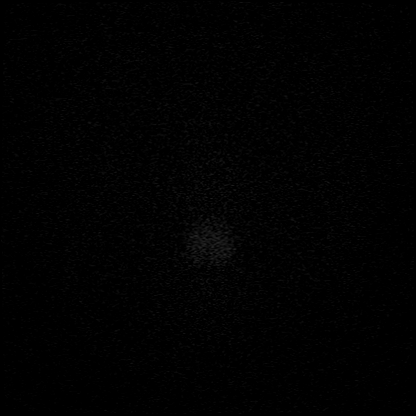

[Series 16: T1 · axial · 1.0mm · 0.98mm/px · z∈[-130,+44]mm · 10 of 176 slices shown (2 of 2)]
[im 1/176]
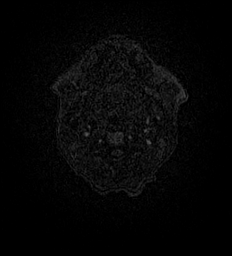
[im 20/176]
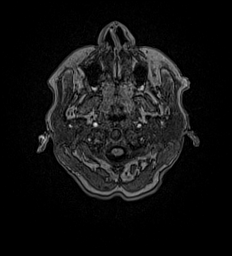
[im 39/176]
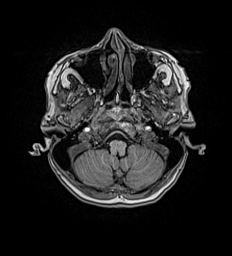
[im 59/176]
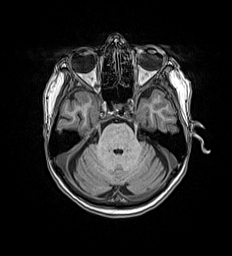
[im 78/176]
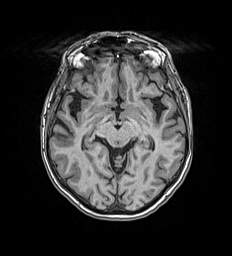
[im 98/176]
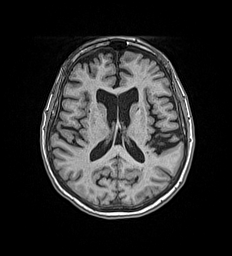
[im 117/176]
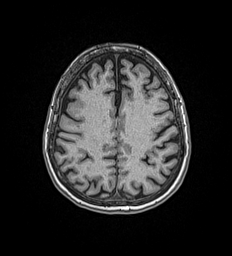
[im 137/176]
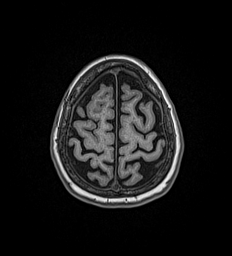
[im 156/176]
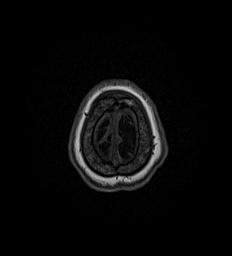
[im 176/176]
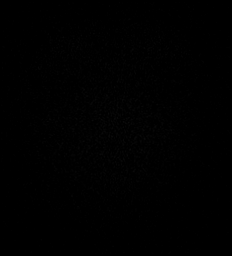

[Series 17: T2 post-contrast · coronal · 5.0mm · 0.57mm/px · 2 of 29 slices shown]
[im 1/29]
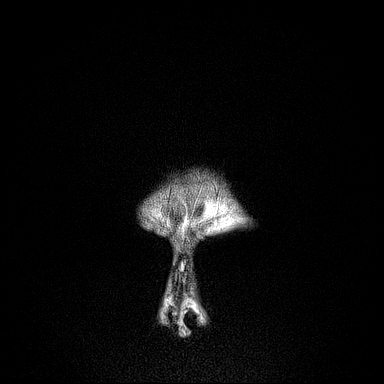
[im 29/29]
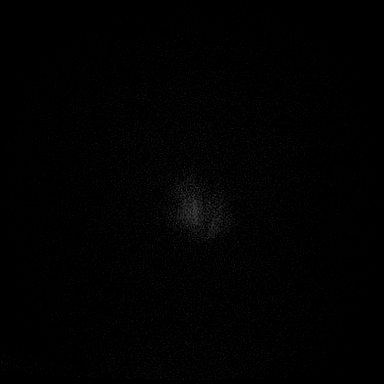

[Series 18: T1 post-contrast · axial · 1.0mm · 0.98mm/px · z∈[-130,+44]mm · 10 of 176 slices shown (1 of 2)]
[im 1/176]
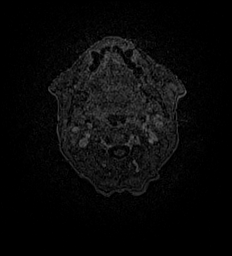
[im 20/176]
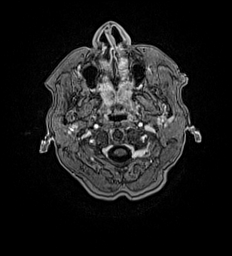
[im 39/176]
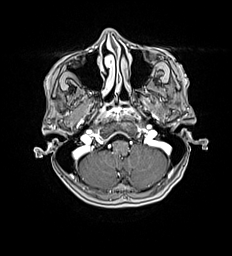
[im 59/176]
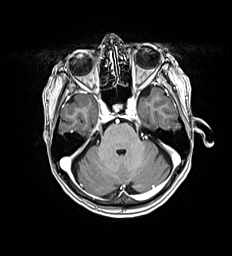
[im 78/176]
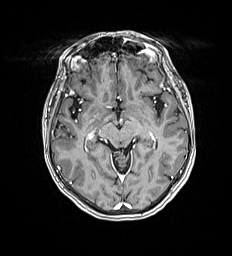
[im 98/176]
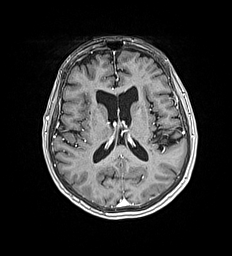
[im 117/176]
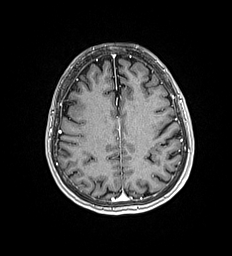
[im 137/176]
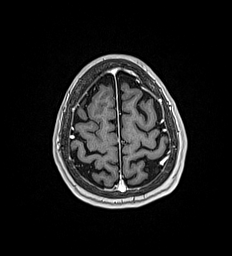
[im 156/176]
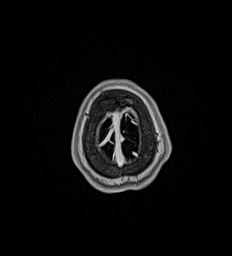
[im 176/176]
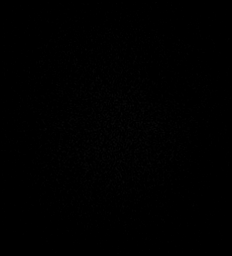

[Series 19: T1 post-contrast · coronal · 5.0mm · 0.57mm/px · 2 of 29 slices shown (2 of 2)]
[im 1/29]
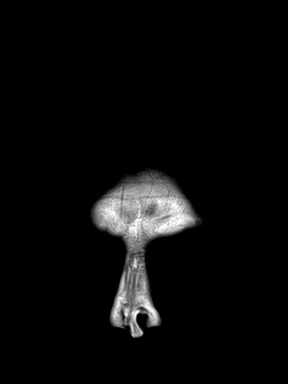
[im 29/29]
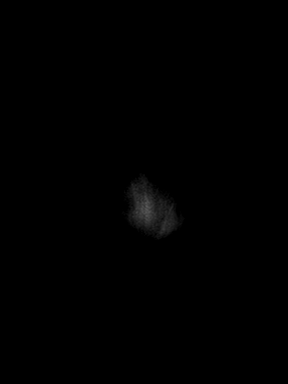

[48 of 48 positions shown; findings below may reference images not displayed]

FINDINGS: Brain: No acute infarct, mass, midline shift, or extra-axial fluid
collection is identified. A small amount of chronic blood products
are noted in the left parietal lobe. There are small chronic white
matter infarcts in the posterior left frontal lobe. No abnormal
enhancement is identified. A small developmental venous anomaly is
incidentally noted in the cerebellum on the left. There is mild
generalized cerebral atrophy.

Vascular: Major intracranial vascular flow voids are preserved.

Skull and upper cervical spine: Unremarkable bone marrow signal.

Sinuses/Orbits: Unremarkable orbits. Paranasal sinuses and mastoid
air cells are clear.

Other: None.
IMPRESSION: 1. No acute intracranial abnormality or mass.
2. Small chronic left frontal white matter infarcts and mild
cerebral atrophy.

## 2019-06-22 MED ORDER — GADOBUTROL 1 MMOL/ML IV SOLN
4.0000 mL | Freq: Once | INTRAVENOUS | Status: AC | PRN
  Administered 2019-06-22: 16:00:00 4 mL via INTRAVENOUS

## 2019-08-25 ENCOUNTER — Other Ambulatory Visit: Payer: Self-pay | Admitting: Physician Assistant

## 2019-08-25 DIAGNOSIS — M5030 Other cervical disc degeneration, unspecified cervical region: Secondary | ICD-10-CM

## 2019-08-25 DIAGNOSIS — M542 Cervicalgia: Secondary | ICD-10-CM

## 2019-08-27 ENCOUNTER — Other Ambulatory Visit: Payer: Self-pay

## 2019-08-27 ENCOUNTER — Ambulatory Visit
Admission: RE | Admit: 2019-08-27 | Discharge: 2019-08-27 | Disposition: A | Discharge status: Home / Self Care | Payer: Medicare Other | Source: Ambulatory Visit | Attending: Physician Assistant | Admitting: Physician Assistant

## 2019-08-27 DIAGNOSIS — M5030 Other cervical disc degeneration, unspecified cervical region: Secondary | ICD-10-CM

## 2019-08-27 DIAGNOSIS — M542 Cervicalgia: Secondary | ICD-10-CM

## 2019-08-27 IMAGING — MR MR CERVICAL SPINE W/O CM
6 series · 42 of 48 positions shown · non-contrast
Comparison: None.

CLINICAL DATA: Neck pain with right upper extremity pain

EXAM:
MRI CERVICAL SPINE WITHOUT CONTRAST
TECHNIQUE: Multiplanar, multisequence MR imaging of the cervical spine was
performed. No intravenous contrast was administered.

[Series 5: T2 · sagittal · 3.0mm · 0.62mm/px · 5 of 13 slices shown (1 of 2)]
[im 1/13]
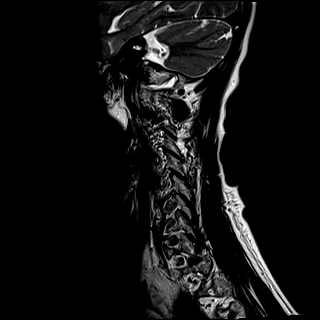
[im 4/13]
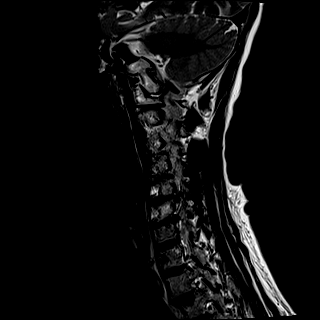
[im 7/13]
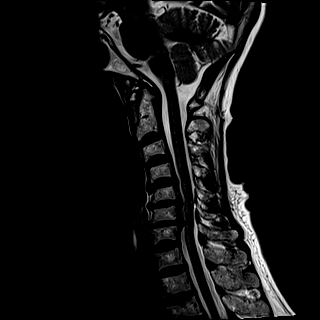
[im 10/13]
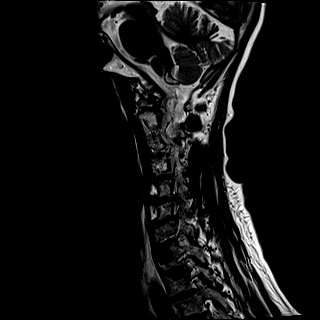
[im 13/13]
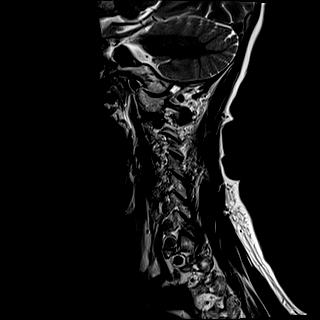

[Series 6: FLAIR · sagittal · 3.0mm · 0.78mm/px · 5 of 13 slices shown]
[im 1/13]
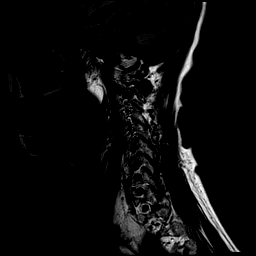
[im 4/13]
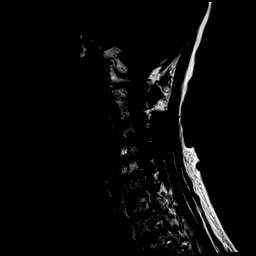
[im 7/13]
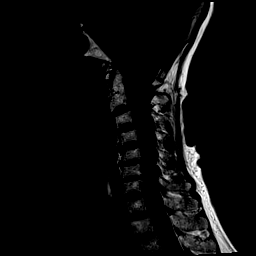
[im 10/13]
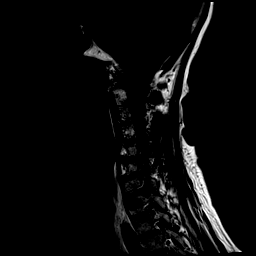
[im 13/13]
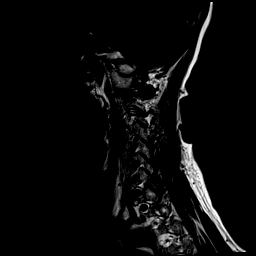

[Series 7: T1 · coronal · 3.0mm · 0.75mm/px · 11 of 28 slices shown]
[im 1/28]
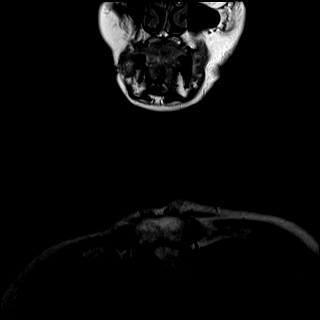
[im 3/28]
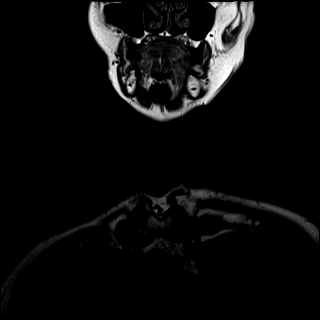
[im 6/28]
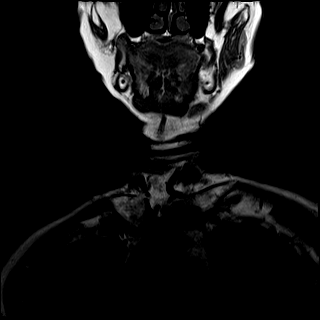
[im 9/28]
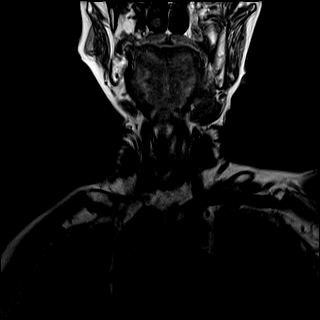
[im 11/28]
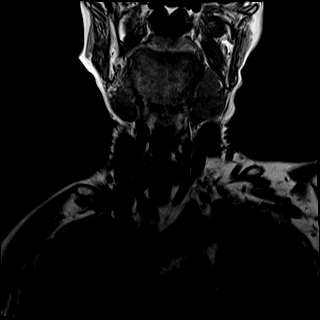
[im 14/28]
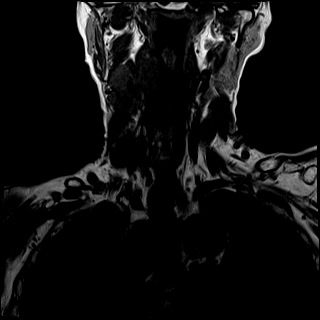
[im 17/28]
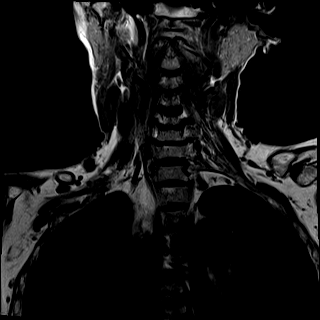
[im 19/28]
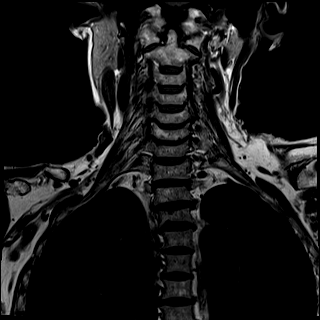
[im 22/28]
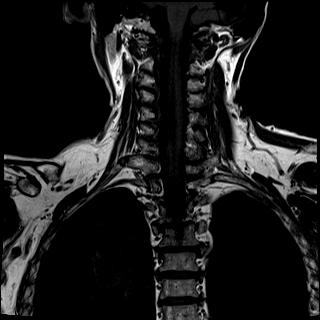
[im 25/28]
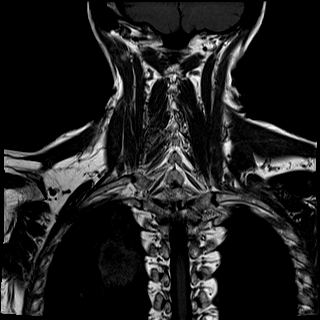
[im 28/28]
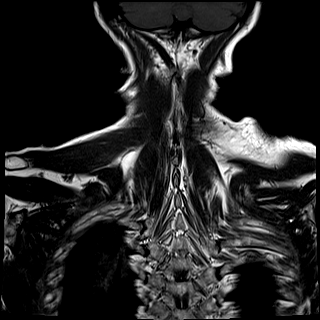

[Series 8: STIR · sagittal · 3.0mm · 0.62mm/px · 5 of 13 slices shown]
[im 1/13]
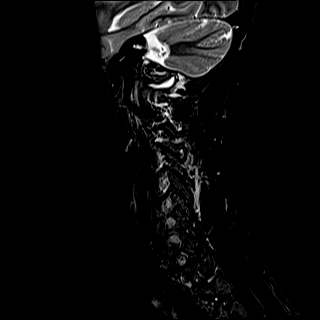
[im 4/13]
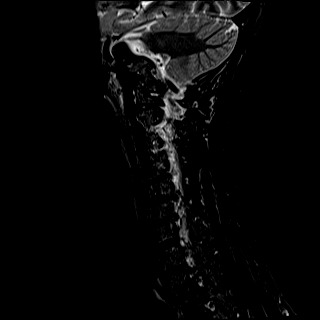
[im 7/13]
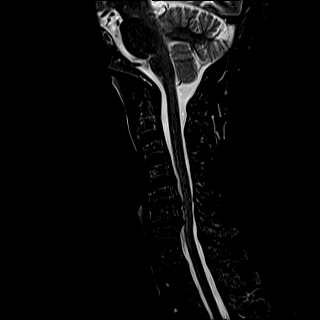
[im 10/13]
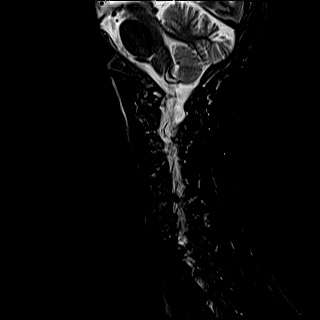
[im 13/13]
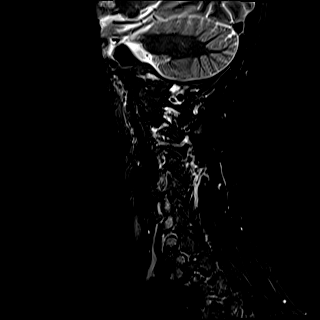

[Series 9: T2 · axial · 3.0mm · 0.70mm/px · z∈[-123,-28]mm · 11 of 29 slices shown (2 of 2)]
[im 1/29]
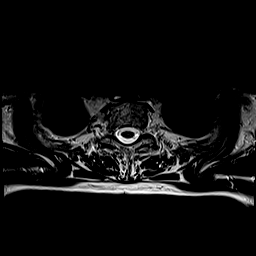
[im 3/29]
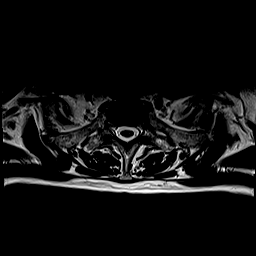
[im 6/29]
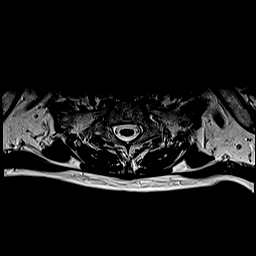
[im 9/29]
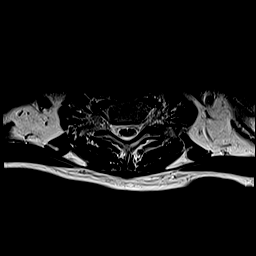
[im 12/29]
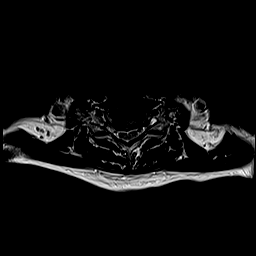
[im 15/29]
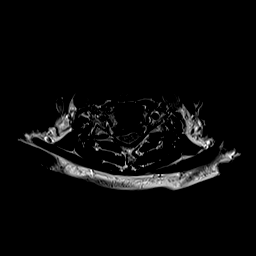
[im 17/29]
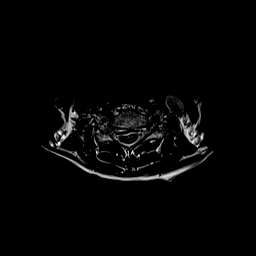
[im 20/29]
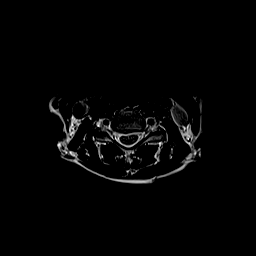
[im 23/29]
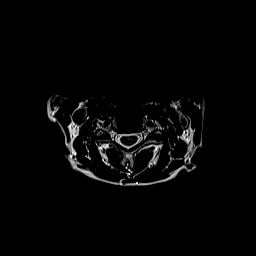
[im 26/29]
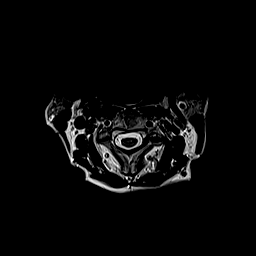
[im 29/29]
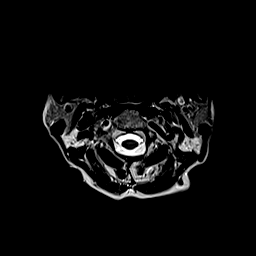

[Series 10: ax mpgr · axial · 3.0mm · 0.35mm/px · z∈[-123,-69]mm · 5 of 29 slices shown]
[im 1/29]
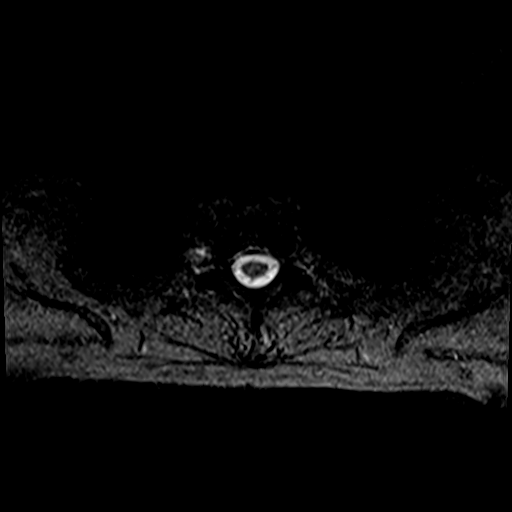
[im 6/29]
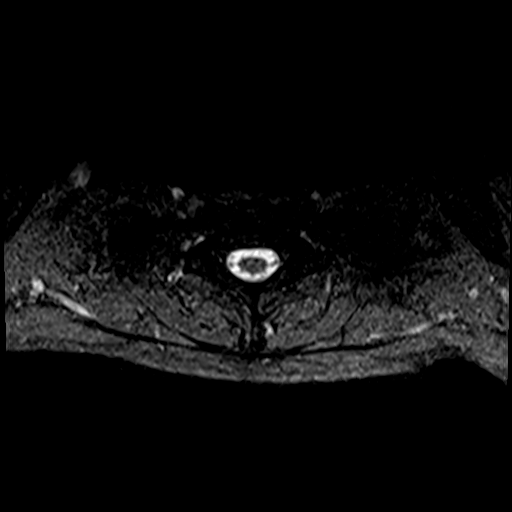
[im 9/29]
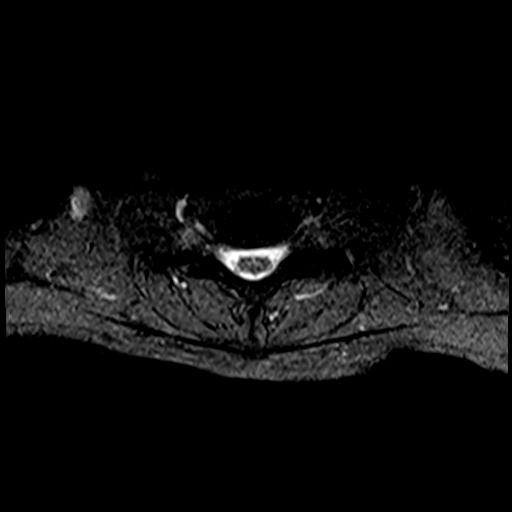
[im 12/29]
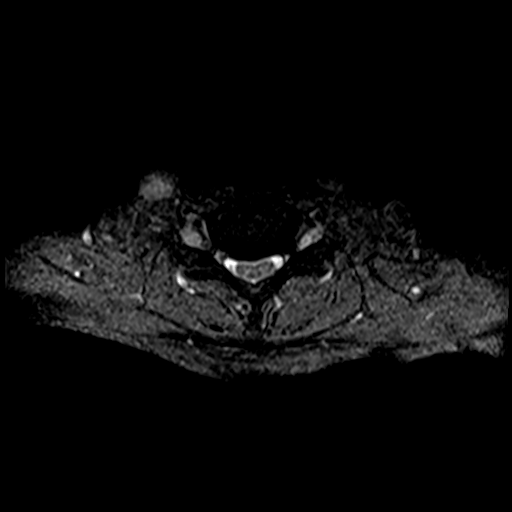
[im 17/29]
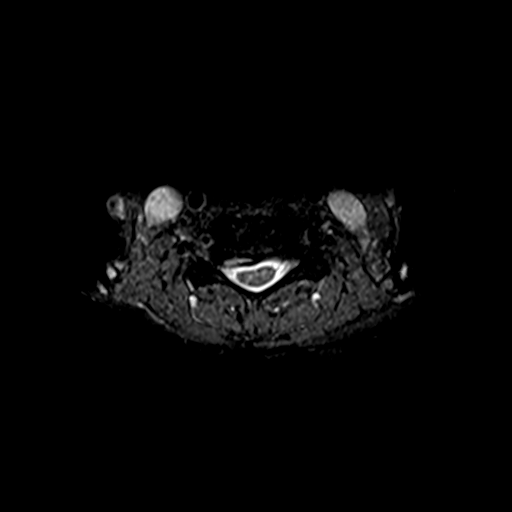

[42 of 48 positions shown; findings below may reference images not displayed]

FINDINGS: Alignment: Reversal of normal cervical lordosis. Grade 1 C3-4
anterolisthesis. Grade 1 C5-6 and C6-7 retrolisthesis.

Vertebrae: No fracture, evidence of discitis, or bone lesion.

Cord: Normal signal and morphology.

Posterior Fossa, vertebral arteries, paraspinal tissues: Negative.

Disc levels:

C1-2: Unremarkable.

C2-3: Normal disc space and facet joints. There is no spinal canal
stenosis. No neural foraminal stenosis.

C3-4: Normal disc space and facet joints. There is no spinal canal
stenosis. No neural foraminal stenosis.

C4-5: Small right subarticular disc protrusion indenting the ventral
spinal cord. There is no spinal canal stenosis. No neural foraminal
stenosis.

C5-6: Large right subarticular/foraminal disc extrusion with
superior migration. Mild spinal canal stenosis. Severe right
foraminal stenosis caused by the disc protrusion. Left uncovertebral
hypertrophy causes severe left neural foraminal stenosis.

C6-7: Intermediate disc bulge with uncovertebral hypertrophy.
Moderate spinal canal stenosis. Mild right and moderate left neural
foraminal stenosis.

C7-T1: Normal disc space and facet joints. There is no spinal canal
stenosis. No neural foraminal stenosis.
IMPRESSION: 1. Large right subarticular/foraminal C5-6 disc extrusion with
superior migration causing mild spinal canal stenosis and severe
right neural foraminal stenosis.
2. Moderate C6-7 spinal canal stenosis with mild right and moderate
left neural foraminal stenosis.
3. Small right subarticular C4-5 disc protrusion indenting the
ventral spinal cord without associated stenosis.

## 2019-10-01 ENCOUNTER — Other Ambulatory Visit: Payer: Self-pay | Admitting: Gerontology

## 2019-10-01 DIAGNOSIS — R918 Other nonspecific abnormal finding of lung field: Secondary | ICD-10-CM

## 2019-10-07 ENCOUNTER — Ambulatory Visit
Admission: RE | Admit: 2019-10-07 | Discharge: 2019-10-07 | Disposition: A | Discharge status: Home / Self Care | Payer: Medicare Other | Source: Ambulatory Visit | Attending: Gerontology | Admitting: Gerontology

## 2019-10-07 ENCOUNTER — Other Ambulatory Visit: Payer: Self-pay

## 2019-10-07 DIAGNOSIS — R918 Other nonspecific abnormal finding of lung field: Secondary | ICD-10-CM | POA: Insufficient documentation

## 2019-10-07 LAB — POCT I-STAT CREATININE: Creatinine, Ser: 0.8 mg/dL (ref 0.44–1.00)

## 2019-10-07 IMAGING — CT CT CHEST W/ CM
2 of 4 series · 15 of 36 positions shown, 18 images · IV contrast (omnipaque)
Comparison: MR thoracic spine, [DATE]

CLINICAL DATA: Lung mass, cough, congestion

EXAM:
CT CHEST WITH CONTRAST
TECHNIQUE: Multidetector CT imaging of the chest was performed during
intravenous contrast administration.
CONTRAST:  60mL OMNIPAQUE IOHEXOL 300 MG/ML  SOLN

[Series 2: axial chest 2.00 · axial · 0.62mm/px · z∈[-1160,-918]mm · 12 of 144 slices shown, 15 images]
[im 12/144  mediastinal]
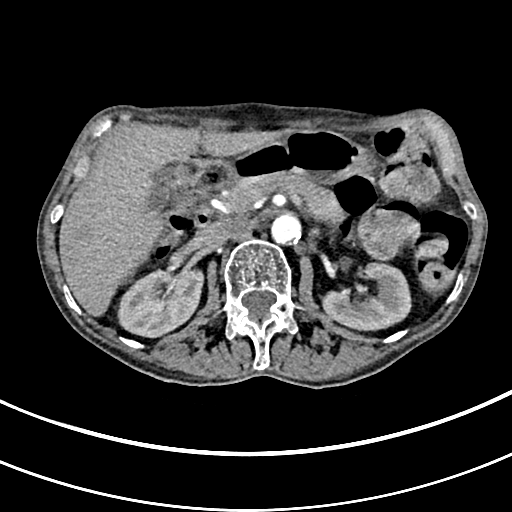
[im 12/144  lung]
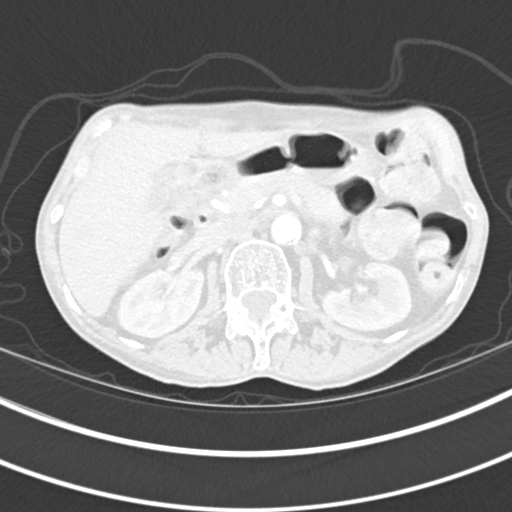
[im 23/144  lung]
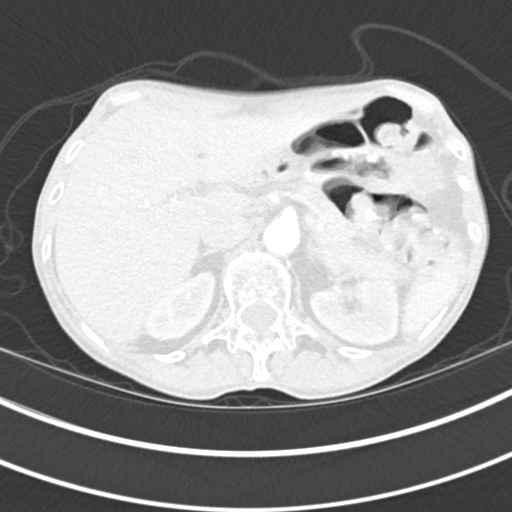
[im 34/144  lung]
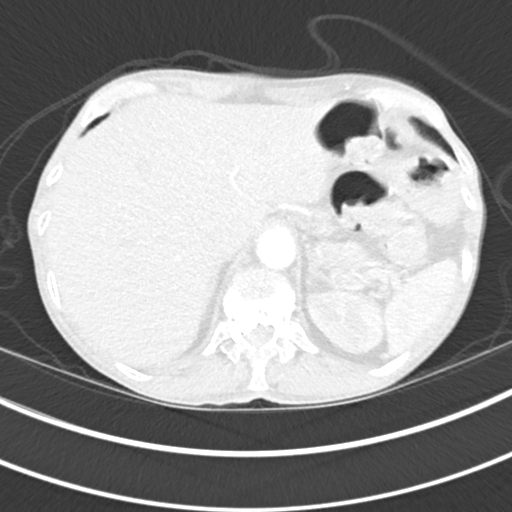
[im 45/144  lung]
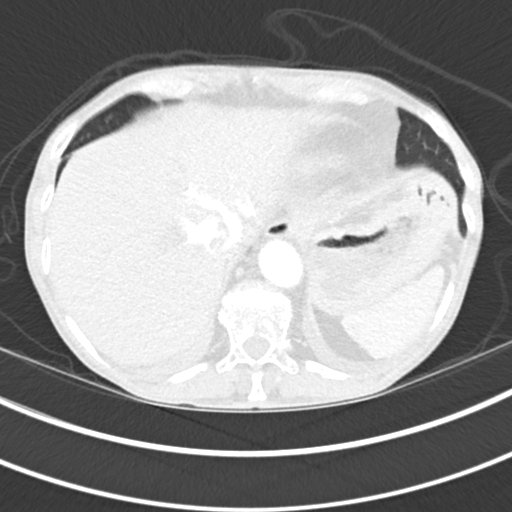
[im 56/144  mediastinal]
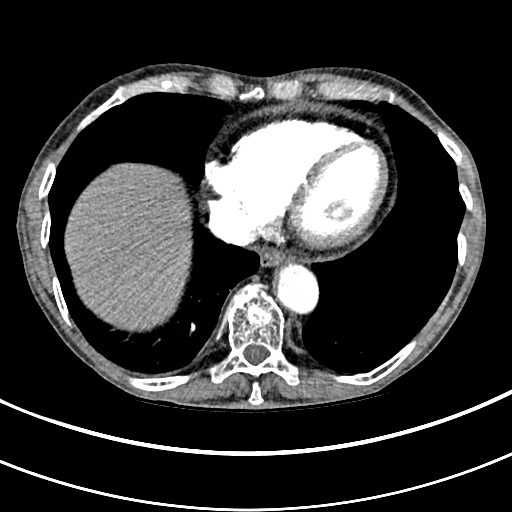
[im 56/144  lung]
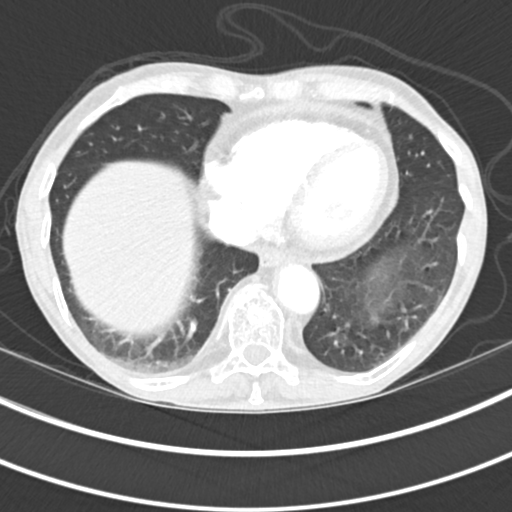
[im 67/144  lung]
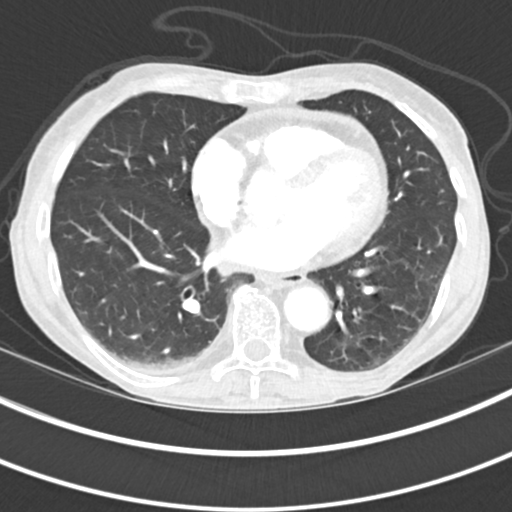
[im 78/144  lung]
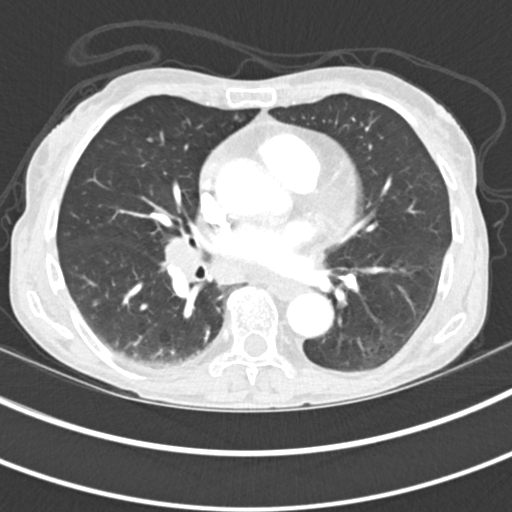
[im 89/144  lung]
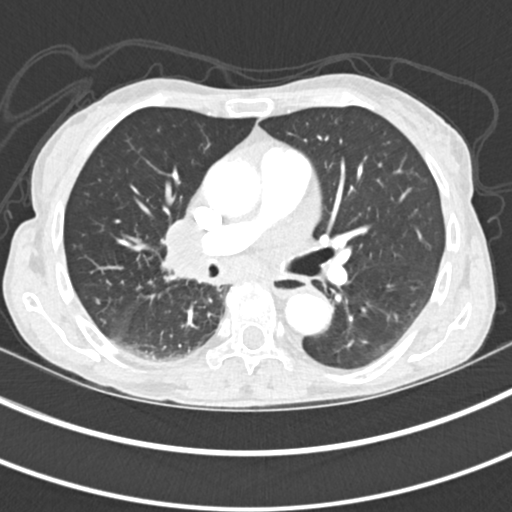
[im 100/144  mediastinal]
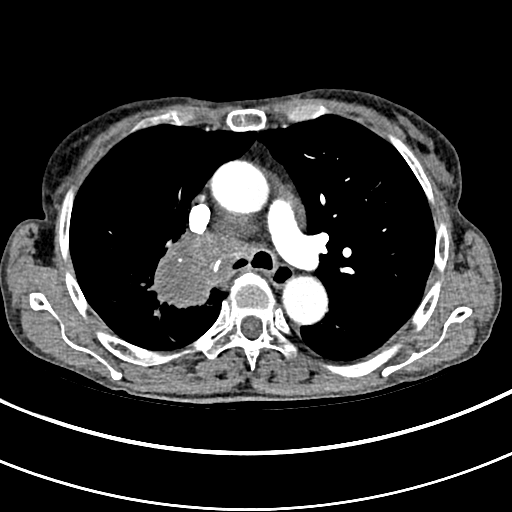
[im 100/144  lung]
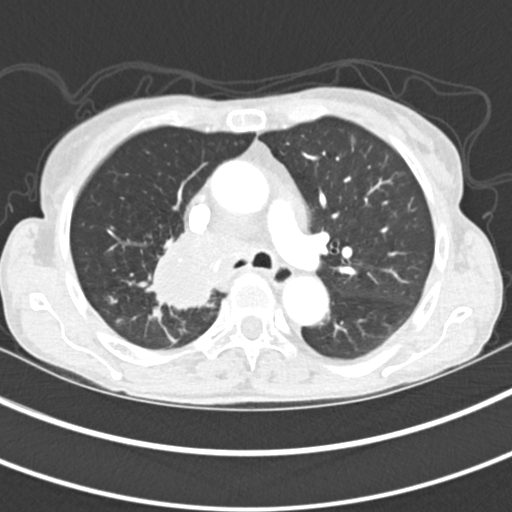
[im 111/144  lung]
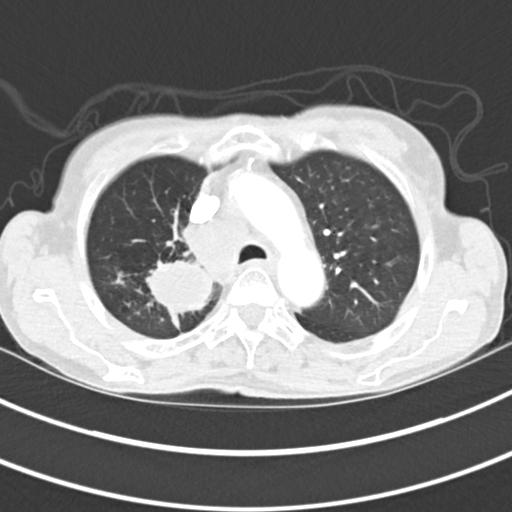
[im 122/144  lung]
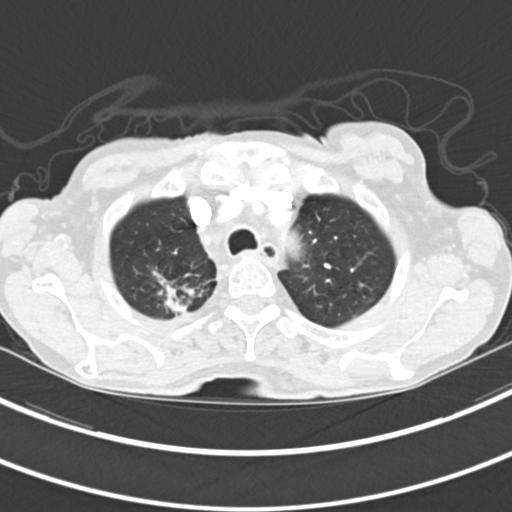
[im 133/144  lung]
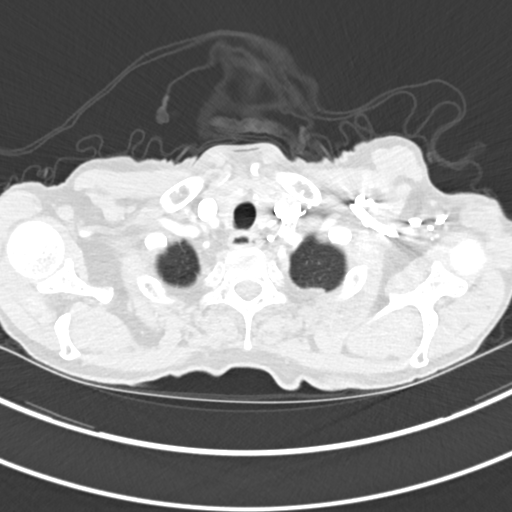

[Series 4: coronal chest 2.00 cor · coronal · 0.57mm/px · 3 of 106 slices shown]
[im 22/106  lung]
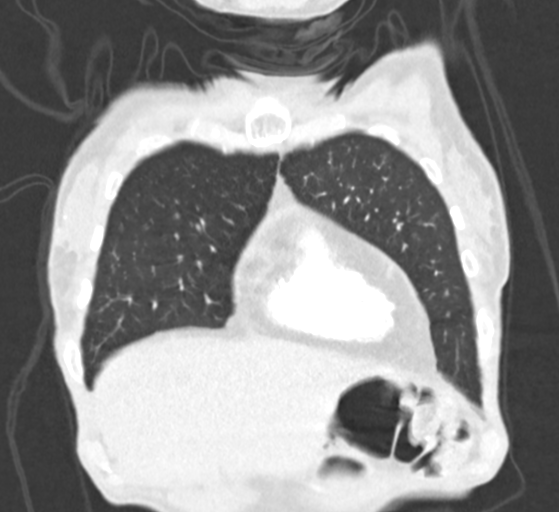
[im 43/106  lung]
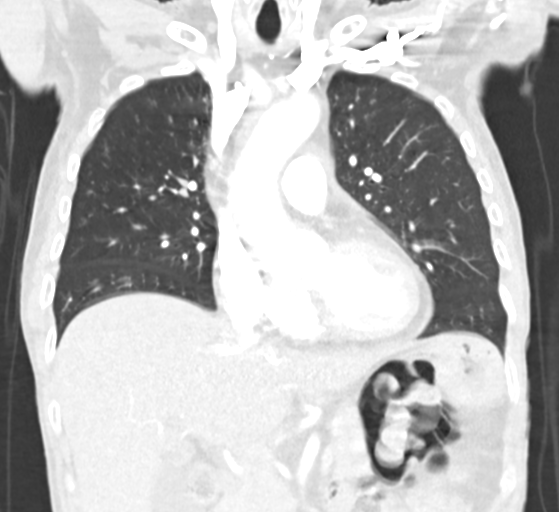
[im 64/106  lung]
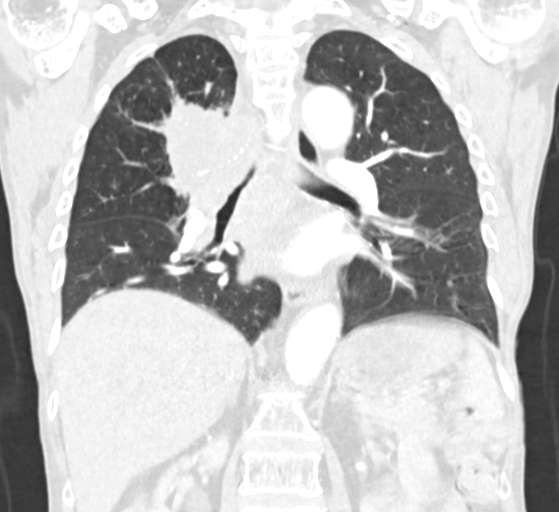

[15 of 36 positions shown; findings below may reference images not displayed]

FINDINGS: Cardiovascular: No significant vascular findings. Normal heart size.
Trace pericardial effusion.

Mediastinum/Nodes: There is bulky, matted pretracheal, right hilar,
and subcarinal lymphadenopathy, largest pretracheal node or
conglomerate measuring at least 4.1 x 3.3 cm (series 2, image 37).
Thyroid gland, trachea, and esophagus demonstrate no significant
findings.

Lungs/Pleura: Rounded suprahilar mass of the posterior right upper
lobe measures 4.0 x 3.9 cm (series 2, image 39). There is associated
soft tissue and or mucous obstruction/impaction of the right
mainstem bronchus (series 2, image 45) and the right upper lobe
bronchi (series 2, image 51), with postobstructive nodularity
distally. Mild centrilobular emphysema. Trace bilateral pleural
effusions.

Upper Abdomen: No acute abnormality.

Musculoskeletal: Osteopenia. Wedge deformities of T10, T11, and T12
with kyphoplasty of T12.
IMPRESSION: 1. Rounded suprahilar mass of the posterior right upper lobe
measures 4.0 x 3.9 cm. There is associated soft tissue and or mucous
obstruction/impaction of the right mainstem bronchus and the right
upper lobe bronchi.
2. Bulky pretracheal, right hilar, and subcarinal lymphadenopathy.
3. Findings are consistent with primary lung malignancy and nodal
metastatic disease.
4. Emphysema ([VO]-[VO]).
5. Trace bilateral pleural effusions and trace pericardial effusion.

These results will be called to the ordering clinician or
representative by the Radiologist Assistant, and communication
documented in the PACS or [REDACTED].

## 2019-10-07 MED ORDER — IOHEXOL 300 MG/ML  SOLN
60.0000 mL | Freq: Once | INTRAMUSCULAR | Status: AC | PRN
  Administered 2019-10-07: 60 mL via INTRAVENOUS

## 2019-10-08 DIAGNOSIS — C3411 Malignant neoplasm of upper lobe, right bronchus or lung: Secondary | ICD-10-CM | POA: Diagnosis present

## 2019-10-21 NOTE — Progress Notes (Signed)
[facility]  [address], [address] [place], [place] [address] Phone: [phone]  Fax: [phone]   Clinic Day:  10/22/2019  Referring physician: Toni Arthurs, NP  Chief Complaint: Anne Hardy is a 69 y.o. female with a right lung mass who is referred in consultation by Toni Arthurs, NP for assessment and management.   HPI:   The patient has a > 35 pack year smoking history.  She smoked 3/4 of a pack for the past 50+ years.  She currently smokes 5-6 cigarettes daily.  She presented to [facility] in [place] on 10/01/2019. She reported that about a week prior, she felt like she had aspirated mucus. Since that time, she had been coughing, rattling, wheezing, and often has trouble catching her breath. She took Mucinex, which helped a little. Her appetite decreased; she lost 5 lbs in 2 months. Exam revealed rales and rhonchi.  CXR suggested a mass (no report available).  She was prescribed Augmentin, prednisone, and an albuterol inhaler.  Chest CT with contrast on 10/07/2019 revealed a rounded 4.0 x 3.9 cm suprahilar mass of the posterior right upper lobe. There was associated soft tissue and or mucous obstruction/impaction of the right mainstem bronchus and the right upper lobe bronchi. There was bulky pretracheal, right hilar, and subcarinal lymphadenopathy. The largest pretracheal node or conglomerate node measured 4.1 x 3.3 cm.  Findings were consistent with primary lung malignancy with nodal metastatic disease. There was emphysema, trace bilateral pleural effusions, and trace pericardial effusion.  Symptomatically, she describes headaches almost daily since 12/2018 and thinks they are due to allergies. She declines head imaging.  She reports that she does not have a lot of energy and feels that she is getting weaker. She has a cough and sometimes coughs up mucus. She has neck pain because of pinched nerves at C5-C6. She has a lot of rib pain and right  shoulder pain. She notes shortness of breath for the past 2-3 nights. The patient takes 1 Vicodin daily and is not on any oxygen. She ran out of her Albuterol inhaler and was put on an Ellipta inhaler but it does not work as well.  She would like to continue to use the albuterol inhaler.  The patient notes that she used to weigh 100-110 lbs. She lost about 10 lbs before she moved here in 11/2018 and then has been slowly losing more. She currently weights 92 pounds.  She notes that her appetite hasn't been great. She lives with her sister who helps her out around the house and cooks her food. She does not drinkEnsure or Boost because she is lactose intolerant. She is very sensitive to a lot of food; she has had abdominal pain. The patient is not interested in speaking to a nutritionist.  The patient received the Moderna COVID-19 vaccine on 06/23/2019 and 20-Aug-2019.  The patient's father died of lung cancer at 45. Her sister has glioblastoma. Her maternal grandmother had colon cancer.   Past Medical History:  Diagnosis Date  . Arthritis   . GERD (gastroesophageal reflux disease)   . Headache    severe headaches  . 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 COLONOSCOPY WITH ESOPHAGOGASTRODUODENOSCOPY (EGD)    . femur fx repair    . FRACTURE SURGERY  2018  femur fx  . KYPHOPLASTY N/A 01/28/2019   Procedure: T12 KYPHOPLASTY;  Surgeon: Hessie Knows, MD;  Location: [facility] ORS;  Service: Orthopedics;  Laterality: N/A;  . OOPHORECTOMY Left     History reviewed. No pertinent family history.  Social History:  reports that she has been smoking cigarettes. She has smoked for the past 50.00 years. She has never used smokeless tobacco. She reports current alcohol use. She reports that she does not use drugs. She has  smoked for the past 50+ years. She previously smoked 3/4 ppd.  She currently smokes 5-6 cigarettes/day.  She drinks one drink per night (Kahlua and cream). The patient grew up in [place] and then moved to [place], [place] for 40 years. She moved to [place] in 12/2018 to live with her sister who also acts as her caregiver. 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 The patient is accompanied by her caregiver/sister, Inez Catalina, on the iPad today.  Allergies:  Allergies  Allergen Reactions  . Lactose Intolerance (Gi)     Current Medications: Current Outpatient Medications  Medication Sig Dispense Refill  . acetaminophen (TYLENOL) 500 MG tablet Take 1,000 mg by mouth 3 (three) times daily as needed (pain.).    Marland Kitchen acetaminophen-codeine (TYLENOL #3) 300-30 MG tablet Take 1 tablet by mouth 2 (two) times daily as needed for moderate pain.    Marland Kitchen alendronate (FOSAMAX) 70 MG tablet Take by mouth.    Marland Kitchen apixaban (ELIQUIS) 5 MG TABS tablet Take 5 mg by mouth 2 (two) times daily.    . Ascorbic Acid (VITAMIN C) 1000 MG tablet Take 1,000 mg by mouth daily.    . B Complex-C (B-COMPLEX WITH VITAMIN C) tablet Take 1 tablet by mouth daily.    Marland Kitchen BREO ELLIPTA 100-25 MCG/INH AEPB Inhale 1 puff into the lungs daily.    . Calcium Carb-Cholecalciferol (CALCIUM 500 + D3 PO) Take 1 tablet by mouth daily.    . Calcium Carb-Cholecalciferol (OYSTER SHELL CALCIUM) 500-400 MG-UNIT TABS Take by mouth.    . cyclobenzaprine (FLEXERIL) 5 MG tablet Take 1 tablet (5 mg total) by mouth 2 (two) times daily as needed for muscle spasms. (Patient taking differently: Take 5 mg by mouth at bedtime. And prn) 30 tablet 0  . gabapentin (NEURONTIN) 100 MG capsule 2 po qHS x 4 days, then bid x 4 days, then tid thereafter.    Marland Kitchen HYDROcodone-acetaminophen (NORCO/VICODIN) 5-325 MG tablet Take 1 tablet by mouth every 6 (six) hours as needed (pain). 15 tablet 0  . HYDROcodone-acetaminophen (NORCO/VICODIN) 5-325  MG tablet Take by mouth.    . Multiple Vitamin (MULTIVITAMIN WITH MINERALS) TABS tablet Take 1 tablet by mouth daily.    Marland Kitchen omeprazole (PRILOSEC) 40 MG capsule Take 40 mg by mouth every morning.     . traZODone (DESYREL) 50 MG tablet Take 50 mg by mouth at bedtime.    Marland Kitchen LORazepam (ATIVAN) 0.5 MG tablet Take 0.5 mg by mouth every 8 (eight) hours as needed (severe headaches).  (Patient not taking: Reported on 10/22/2019)    . traMADol (ULTRAM) 50 MG tablet Take 1 tablet (50 mg total) by mouth every 12 (twelve) hours as needed. (Patient not taking: Reported on 10/22/2019) 10 tablet 0   No current facility-administered medications for this visit.    Review of Systems  Constitutional: Positive for malaise/fatigue and weight loss (10-18 pounds). Negative for chills, diaphoresis and fever.  HENT: Negative.  Negative for congestion, ear  discharge, ear pain, hearing loss, nosebleeds, sinus pain, sore throat and tinnitus.   Eyes: Negative.  Negative for blurred vision.  Respiratory: Positive for cough, sputum production (occasional mucus) and shortness of breath (x 2-3 nights). Negative for hemoptysis.   Cardiovascular: Negative.  Negative for chest pain, palpitations and leg swelling.  Gastrointestinal: Positive for abdominal pain. Negative for blood in stool, constipation, diarrhea, heartburn, melena, nausea and vomiting.       Decreased appetite. Lactose intolerant.  Genitourinary: Negative.  Negative for dysuria, frequency, hematuria and urgency.  Musculoskeletal: Positive for joint pain (right shoulder) and neck pain (pinched nerves at C5-C6). Negative for back pain and myalgias.  Skin: Negative.  Negative for itching and rash.  Neurological: Positive for weakness (worsening) and headaches (daily, felt due to allergies). Negative for dizziness, tingling and sensory change.  Endo/Heme/Allergies: Negative.  Does not bruise/bleed easily.  Psychiatric/Behavioral: Negative.  Negative for depression and  memory loss. The patient is not nervous/anxious and does not have insomnia.   All other systems reviewed and are negative.  Performance status (ECOG):  1-2  Vitals Blood pressure 99/65, pulse 88, temperature 98 F (36.7 C), temperature source Tympanic, weight 92 lb 6 oz (41.9 kg), SpO2 97 %.   Physical Exam Vitals and nursing note reviewed.  Constitutional:      General: She is not in acute distress.    Appearance: She is not diaphoretic.     Comments: Thin woman sitting comfortably in the clinic in no acute distress.  HENT:     Head: Normocephalic and atraumatic.     Comments: Short gray hair.    Mouth/Throat:     Mouth: Mucous membranes are moist.     Pharynx: Oropharynx is clear.  Eyes:     General: No scleral icterus.    Extraocular Movements: Extraocular movements intact.     Conjunctiva/sclera: Conjunctivae normal.     Pupils: Pupils are equal, round, and reactive to light.     Comments: Blue eyes.  Cardiovascular:     Rate and Rhythm: Normal rate and regular rhythm.     Heart sounds: Normal heart sounds. No murmur heard.   Pulmonary:     Effort: Pulmonary effort is normal. No respiratory distress.     Breath sounds: Wheezing present. No rales.     Comments: Upper airway sounds Chest:     Chest wall: No tenderness.  Abdominal:     General: Bowel sounds are normal. There is no distension.     Palpations: Abdomen is soft. There is no hepatomegaly, splenomegaly or mass.     Tenderness: There is no abdominal tenderness. There is no guarding or rebound.     Hernia: A hernia is present.  Musculoskeletal:        General: No swelling or tenderness. Normal range of motion.     Cervical back: Normal range of motion and neck supple.  Lymphadenopathy:     Head:     Right side of head: No preauricular, posterior auricular or occipital adenopathy.     Left side of head: No preauricular, posterior auricular or occipital adenopathy.     Cervical: No cervical adenopathy.      Upper Body:     Right upper body: No supraclavicular or axillary adenopathy.     Left upper body: No supraclavicular or axillary adenopathy.     Lower Body: No right inguinal adenopathy. No left inguinal adenopathy.  Skin:    General: Skin is warm and dry.  Neurological:  Mental Status: She is alert and oriented to person, place, and time.  Psychiatric:        Behavior: Behavior normal.        Thought Content: Thought content normal.        Judgment: Judgment normal.     Comments: Tearful     Orders Only on 10/22/2019  Component Date Value Ref Range Status  . Folate 10/22/2019 5.7* >5.9 ng/mL Final   Performed at [facility], [address] [place], [place] [address]  . Vitamin B-12 10/22/2019 568  180 - 914 pg/mL Final   Comment: (NOTE) This assay is not validated for testing neonatal or myeloproliferative syndrome specimens for Vitamin B12 levels. Performed at [facility], [place], [place], [place] [address]   . Ferritin 10/22/2019 114  11 - 307 ng/mL Final   Performed at [facility], [address] [place], [place] [address]  . Iron 10/22/2019 27* 28 - 170 ug/dL Final  . TIBC 10/22/2019 241* 250 - 450 ug/dL Final  . Saturation Ratios 10/22/2019 11  10.4 - 31.8 % Final  . UIBC 10/22/2019 214  ug/dL Final   Performed at [facility], [address], [place], [place] [address]  Appointment on 10/22/2019  Component Date Value Ref Range Status  . LDH 10/22/2019 98  98 - 192 U/L Final   Performed at [facility], [address], [place], [place] [address]  . CEA 10/22/2019 4.7  0.0 - 4.7 ng/mL Final   Comment: (NOTE)                             Nonsmokers          <3.9                             Smokers             <5.6 [employer] Electrochemiluminescence Immunoassay (ECLIA) Values obtained with different assay methods or kits cannot be used interchangeably.  Results cannot be interpreted as absolute  evidence of the presence or absence of malignant disease. Performed At: [facility] [place], [place] [id number] Rush Farmer MD HF:[id number]   . Sodium 10/22/2019 133* 135 - 145 mmol/L Final  . Potassium 10/22/2019 4.0  3.5 - 5.1 mmol/L Final  . Chloride 10/22/2019 99  98 - 111 mmol/L Final  . CO2 10/22/2019 24  22 - 32 mmol/L Final  . Glucose, Bld 10/22/2019 102* 70 - 99 mg/dL Final   Glucose reference range applies only to samples taken after fasting for at least 8 hours.  . BUN 10/22/2019 9  8 - 23 mg/dL Final  . Creatinine, Ser 10/22/2019 0.73  0.44 - 1.00 mg/dL Final  . Calcium 10/22/2019 8.8* 8.9 - 10.3 mg/dL Final  . Total Protein 10/22/2019 6.9  6.5 - 8.1 g/dL Final  . Albumin 10/22/2019 3.2* 3.5 - 5.0 g/dL Final  . AST 10/22/2019 14* 15 - 41 U/L Final  . ALT 10/22/2019 15  0 - 44 U/L Final  . Alkaline Phosphatase 10/22/2019 79  38 - 126 U/L Final  . Total Bilirubin 10/22/2019 0.4  0.3 - 1.2 mg/dL Final  . GFR calc non Af Amer 10/22/2019 >60  >60 mL/min Final  . GFR calc Af Amer 10/22/2019 >60  >60 mL/min Final  . Anion gap 10/22/2019 10  5 - 15 Final   Performed at [facility], [address] [place], [place] [address]  . WBC 10/22/2019 17.0* 4.0 - 10.5 K/uL Final  . RBC 10/22/2019 4.03  3.87 - 5.11 MIL/uL Final  . Hemoglobin 10/22/2019 11.1* 12.0 - 15.0 g/dL Final  . HCT 10/22/2019 33.7* 36 - 46 % Final  . MCV 10/22/2019 83.6  80.0 - 100.0 fL Final  . MCH 10/22/2019 27.5  26.0 - 34.0 pg Final  . MCHC 10/22/2019 32.9  30.0 - 36.0 g/dL Final  . RDW 10/22/2019 15.7* 11.5 - 15.5 % Final  . Platelets 10/22/2019 709* 150 - 400 K/uL Final  . nRBC 10/22/2019 0.0  0.0 - 0.2 % Final  . Neutrophils Relative % 10/22/2019 81  % Final  . Neutro Abs 10/22/2019 13.6* 1.7 - 7.7 K/uL Final  . Lymphocytes Relative 10/22/2019 11  % Final  . Lymphs Abs 10/22/2019 1.9  0.7 - 4.0 K/uL Final  . Monocytes Relative 10/22/2019 7  % Final  . Monocytes  Absolute 10/22/2019 1.3* 0 - 1 K/uL Final  . Eosinophils Relative 10/22/2019 0  % Final  . Eosinophils Absolute 10/22/2019 0.1  0 - 0 K/uL Final  . Basophils Relative 10/22/2019 0  % Final  . Basophils Absolute 10/22/2019 0.0  0 - 0 K/uL Final  . Immature Granulocytes 10/22/2019 1  % Final  . Abs Immature Granulocytes 10/22/2019 0.11* 0.00 - 0.07 K/uL Final   Performed at [facility], [address], [place], [place] [address]    Assessment:  Anne Hardy is a 69 y.o. female with a right lung mass and mediastinal adenopathy.  She has a > 35 pack year smoking history.  She presented with cough, shortness of breath, and weight loss.  Chest CT with contrast on 10/07/2019 revealed a rounded 4.0 x 3.9 cm suprahilar mass of the posterior right upper lobe. There was associated soft tissue and or mucous obstruction/impaction of the right mainstem bronchus and the right upper lobe bronchi. There was bulky pretracheal, right hilar, and subcarinal lymphadenopathy. The largest pretracheal node or conglomerate node measured 4.1 x 3.3 cm.  Findings were consistent with primary lung malignancy with nodal metastatic disease. There was emphysema, trace bilateral pleural effusions, and trace pericardial effusion.  She has a history of a CVA on 06/2018. She is on Eliquis.  The patient received the Moderna COVID-19 vaccine on 06/23/2019 and 07/21/2019.  Symptomatically, she is fatigued.  She has a chronic cough and shortness of breath.  She has a chronic headache she relates to allergies.  Exam reveals upper airway sounds and wheezing.  Plan: 1.   Labs today:  CBC with diff, CMP, CEA, LDH. 2.   Right lung mass  Review chest CT with patient.  Discuss concern for stage III lung cancer.  Discuss imaging studies (PET scan) to assess for metastatic disease.  Discuss obtaining tissue for diagnosis (via bronchoscopy or CT guided if another lesion amendable to biopsy).   Contact Dr Lanney Gins and Dr  Raul Del- done.   Patient will be seen by Dr Raul Del on 10/25/2019 at 3:00 pm.  Discuss head MRI- patient declines.  Discuss plan for treatment based on imaging studies and biopsy results.   Foundation One to be sent if metastatic disease. 3.   Normocytic anemia  Etiology likely secondary to poor nutrition.  Labs today to include ferritin, iron studies, B12, folate. 4.   Weight loss  Discuss importance of caloric intake.  Discuss  consultation with nutrition.   Patient declines. 5.   Contact Toni Arthurs, NP regarding respiratory status and albuterol- done. 6.   Consult pulmonary medicine (Dr Lanney Gins or Dr Raul Del)  7.   PET scan- ASAP. 8.   RTC in 10 days for MD assessment and review of imaging and pathology.  I discussed the assessment and treatment plan with the patient.  The patient was provided an opportunity to ask questions and all were answered.  The patient agreed with the plan and demonstrated an understanding of the instructions.  The patient was advised to call back if the symptoms worsen or if the condition fails to improve as anticipated.  I provided 39 minutes of face-to-face time during this this encounter and > 50% was spent counseling as documented under my assessment and plan. An additional 25+ minutes were spent reviewing her chart (Epic and Care Everywhere) including notes, labs, and imaging studies and contacting Dr Raul Del, Dr Lanney Gins, and Toni Arthurs, NP.    Afrah Burlison C. Mike Gip, MD, PhD    10/21/2019, 11:29 AM  I, Mirian Mo Tufford, am acting as Education administrator for [employer]. Mike Gip, MD, PhD.  I, Desteny Freeman C. Mike Gip, MD, have reviewed the above documentation for accuracy and completeness, and I agree with the above.

## 2019-10-22 ENCOUNTER — Other Ambulatory Visit: Payer: Self-pay

## 2019-10-22 ENCOUNTER — Inpatient Hospital Stay: Payer: Medicare Other | Attending: Hematology and Oncology | Admitting: Hematology and Oncology

## 2019-10-22 ENCOUNTER — Telehealth: Payer: Self-pay

## 2019-10-22 ENCOUNTER — Encounter: Payer: Self-pay | Admitting: Hematology and Oncology

## 2019-10-22 ENCOUNTER — Inpatient Hospital Stay: Payer: Medicare Other

## 2019-10-22 VITALS — BP 99/65 | HR 88 | Temp 98.0°F | Wt 92.4 lb

## 2019-10-22 DIAGNOSIS — Z808 Family history of malignant neoplasm of other organs or systems: Secondary | ICD-10-CM | POA: Insufficient documentation

## 2019-10-22 DIAGNOSIS — D649 Anemia, unspecified: Secondary | ICD-10-CM

## 2019-10-22 DIAGNOSIS — Z801 Family history of malignant neoplasm of trachea, bronchus and lung: Secondary | ICD-10-CM | POA: Diagnosis not present

## 2019-10-22 DIAGNOSIS — R7989 Other specified abnormal findings of blood chemistry: Secondary | ICD-10-CM | POA: Diagnosis not present

## 2019-10-22 DIAGNOSIS — Z800 Family history of malignant neoplasm of digestive organs: Secondary | ICD-10-CM | POA: Diagnosis not present

## 2019-10-22 DIAGNOSIS — Z8673 Personal history of transient ischemic attack (TIA), and cerebral infarction without residual deficits: Secondary | ICD-10-CM | POA: Diagnosis not present

## 2019-10-22 DIAGNOSIS — F17210 Nicotine dependence, cigarettes, uncomplicated: Secondary | ICD-10-CM | POA: Diagnosis not present

## 2019-10-22 DIAGNOSIS — R918 Other nonspecific abnormal finding of lung field: Secondary | ICD-10-CM | POA: Insufficient documentation

## 2019-10-22 DIAGNOSIS — R590 Localized enlarged lymph nodes: Secondary | ICD-10-CM | POA: Diagnosis not present

## 2019-10-22 DIAGNOSIS — Z7901 Long term (current) use of anticoagulants: Secondary | ICD-10-CM | POA: Insufficient documentation

## 2019-10-22 DIAGNOSIS — R634 Abnormal weight loss: Secondary | ICD-10-CM | POA: Diagnosis not present

## 2019-10-22 LAB — COMPREHENSIVE METABOLIC PANEL
ALT: 15 U/L (ref 0–44)
AST: 14 U/L — ABNORMAL LOW (ref 15–41)
Albumin: 3.2 g/dL — ABNORMAL LOW (ref 3.5–5.0)
Alkaline Phosphatase: 79 U/L (ref 38–126)
Anion gap: 10 (ref 5–15)
BUN: 9 mg/dL (ref 8–23)
CO2: 24 mmol/L (ref 22–32)
Calcium: 8.8 mg/dL — ABNORMAL LOW (ref 8.9–10.3)
Chloride: 99 mmol/L (ref 98–111)
Creatinine, Ser: 0.73 mg/dL (ref 0.44–1.00)
GFR calc Af Amer: >60 mL/min (ref >60)
GFR calc non Af Amer: >60 mL/min (ref >60)
Glucose, Bld: 102 mg/dL — ABNORMAL HIGH (ref 70–99)
Potassium: 4 mmol/L (ref 3.5–5.1)
Sodium: 133 mmol/L — ABNORMAL LOW (ref 135–145)
Total Bilirubin: 0.4 mg/dL (ref 0.3–1.2)
Total Protein: 6.9 g/dL (ref 6.5–8.1)

## 2019-10-22 LAB — CBC WITH DIFFERENTIAL/PLATELET
Abs Immature Granulocytes: 0.11 10*3/uL — ABNORMAL HIGH (ref 0.00–0.07)
Basophils Absolute: 0 10*3/uL (ref 0.0–0.1)
Basophils Relative: 0 %
Eosinophils Absolute: 0.1 10*3/uL (ref 0.0–0.5)
Eosinophils Relative: 0 %
HCT: 33.7 % — ABNORMAL LOW (ref 36.0–46.0)
Hemoglobin: 11.1 g/dL — ABNORMAL LOW (ref 12.0–15.0)
Immature Granulocytes: 1 %
Lymphocytes Relative: 11 %
Lymphs Abs: 1.9 10*3/uL (ref 0.7–4.0)
MCH: 27.5 pg (ref 26.0–34.0)
MCHC: 32.9 g/dL (ref 30.0–36.0)
MCV: 83.6 fL (ref 80.0–100.0)
Monocytes Absolute: 1.3 10*3/uL — ABNORMAL HIGH (ref 0.1–1.0)
Monocytes Relative: 7 %
Neutro Abs: 13.6 10*3/uL — ABNORMAL HIGH (ref 1.7–7.7)
Neutrophils Relative %: 81 %
Platelets: 709 10*3/uL — ABNORMAL HIGH (ref 150–400)
RBC: 4.03 MIL/uL (ref 3.87–5.11)
RDW: 15.7 % — ABNORMAL HIGH (ref 11.5–15.5)
WBC: 17 10*3/uL — ABNORMAL HIGH (ref 4.0–10.5)
nRBC: 0 % (ref 0.0–0.2)

## 2019-10-22 LAB — IRON AND TIBC
Iron: 27 ug/dL — ABNORMAL LOW (ref 28–170)
Saturation Ratios: 11 % (ref 10.4–31.8)
TIBC: 241 ug/dL — ABNORMAL LOW (ref 250–450)
UIBC: 214 ug/dL

## 2019-10-22 LAB — FOLATE: Folate: 5.7 ng/mL — ABNORMAL LOW (ref >5.9)

## 2019-10-22 LAB — LACTATE DEHYDROGENASE: LDH: 98 U/L (ref 98–192)

## 2019-10-22 LAB — FERRITIN: Ferritin: 114 ng/mL (ref 11–307)

## 2019-10-22 LAB — VITAMIN B12: Vitamin B-12: 568 pg/mL (ref 180–914)

## 2019-10-22 NOTE — Telephone Encounter (Signed)
Faxed new patient information to Dr Raul Del office The patient is schedule for 7/12 @ 3 pm

## 2019-10-22 NOTE — Progress Notes (Signed)
The patient c/o having right shoulder with SOB x several months ( pain level today 8) which has been constant.

## 2019-10-23 LAB — CEA: CEA: 4.7 ng/mL (ref 0.0–4.7)

## 2019-10-24 DIAGNOSIS — R634 Abnormal weight loss: Secondary | ICD-10-CM | POA: Insufficient documentation

## 2019-10-24 DIAGNOSIS — D649 Anemia, unspecified: Secondary | ICD-10-CM | POA: Insufficient documentation

## 2019-10-25 ENCOUNTER — Telehealth: Payer: Self-pay

## 2019-10-25 ENCOUNTER — Other Ambulatory Visit: Payer: Self-pay | Admitting: Pulmonary Disease

## 2019-10-25 DIAGNOSIS — C3491 Malignant neoplasm of unspecified part of right bronchus or lung: Secondary | ICD-10-CM

## 2019-10-25 NOTE — Telephone Encounter (Signed)
spoke with the patient to inform her that she has appointment with dr Raul Del today at 3pm. The patient was understanding and agreeable.

## 2019-10-27 ENCOUNTER — Telehealth: Payer: Self-pay | Admitting: Hematology and Oncology

## 2019-10-27 ENCOUNTER — Other Ambulatory Visit: Payer: Self-pay

## 2019-10-27 ENCOUNTER — Ambulatory Visit
Admission: RE | Admit: 2019-10-27 | Discharge: 2019-10-27 | Disposition: A | Discharge status: Home / Self Care | Payer: Medicare Other | Source: Ambulatory Visit | Attending: Hematology and Oncology | Admitting: Hematology and Oncology

## 2019-10-27 DIAGNOSIS — R918 Other nonspecific abnormal finding of lung field: Secondary | ICD-10-CM | POA: Diagnosis present

## 2019-10-27 DIAGNOSIS — R590 Localized enlarged lymph nodes: Secondary | ICD-10-CM | POA: Diagnosis not present

## 2019-10-27 LAB — GLUCOSE, CAPILLARY: Glucose-Capillary: 102 mg/dL — ABNORMAL HIGH (ref 70–99)

## 2019-10-27 IMAGING — CT NM PET TUM IMG INITIAL (PI) SKULL BASE T - THIGH
10 series · 24 of 25 positions shown · non-contrast
Comparison: Chest CT [DATE]

CLINICAL DATA: Initial treatment strategy for right lung mass,
suspected bronchogenic carcinoma.

EXAM:
NUCLEAR MEDICINE PET SKULL BASE TO THIGH
TECHNIQUE: 5.55 mCi F-18 FDG was injected intravenously. Full-ring PET imaging
was performed from the skull base to thigh after the radiotracer. CT
data was obtained and used for attenuation correction and anatomic
localization.
Fasting blood glucose: 102 mg/dl

[Series 3: ct wb 5.0 b30f · axial · 5.0mm · 0.98mm/px · z∈[-900,-150]mm · 3 of 251 slices shown]
[im 1/251]
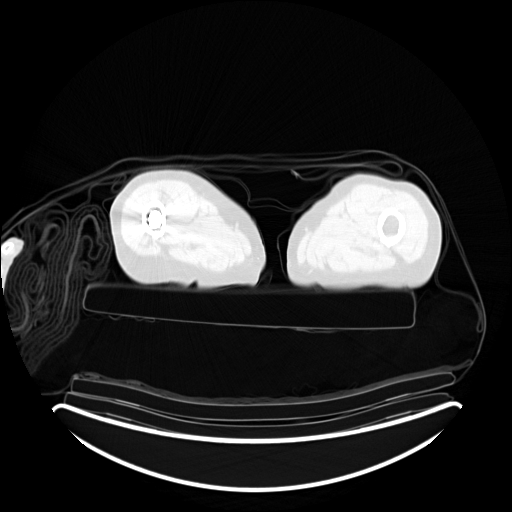
[im 126/251]
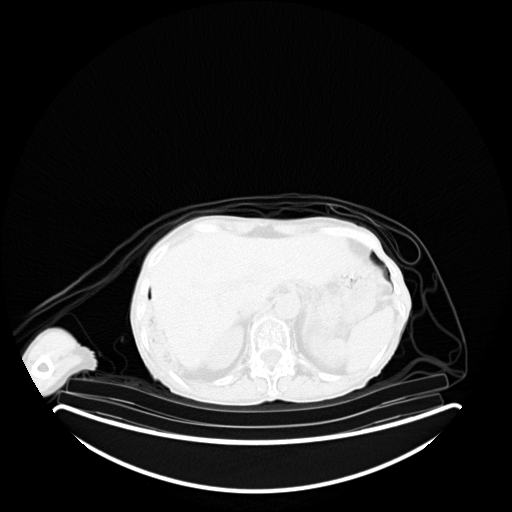
[im 251/251  brain]
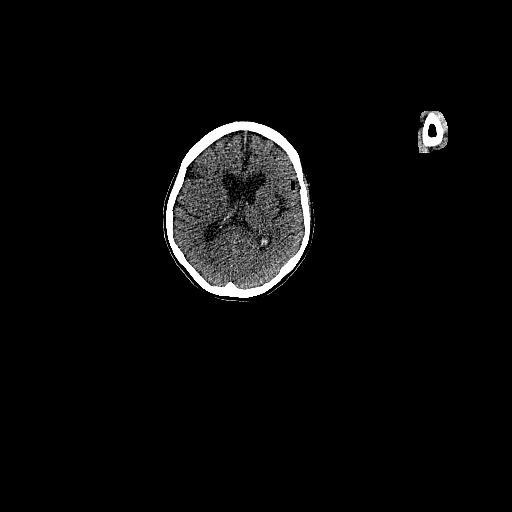

[Series 5: pet wb uncorrected (nac) · axial · 5.0mm · 4.07mm/px · z∈[-900,-150]mm · 3 of 251 slices shown]
[im 1/251]
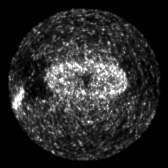
[im 126/251]
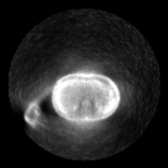
[im 251/251]
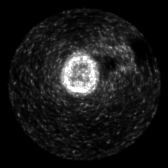

[Series 6: pet wb (ac) · axial · 5.0mm · 2.72mm/px · z∈[-900,-150]mm · 4 of 251 slices shown]
[im 1/251]
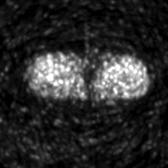
[im 84/251]
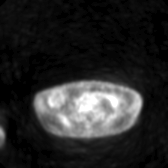
[im 167/251]
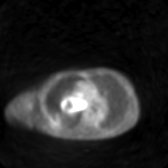
[im 251/251]
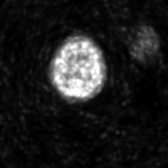

[Series 603: pet_ct axial fused · 2 of 221 slices shown]
[im 1/221]
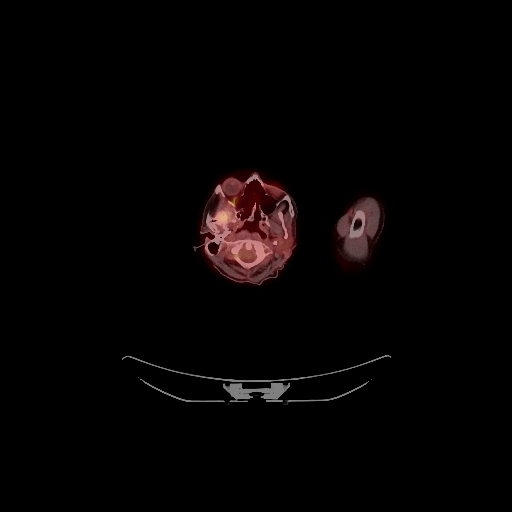
[im 111/221]
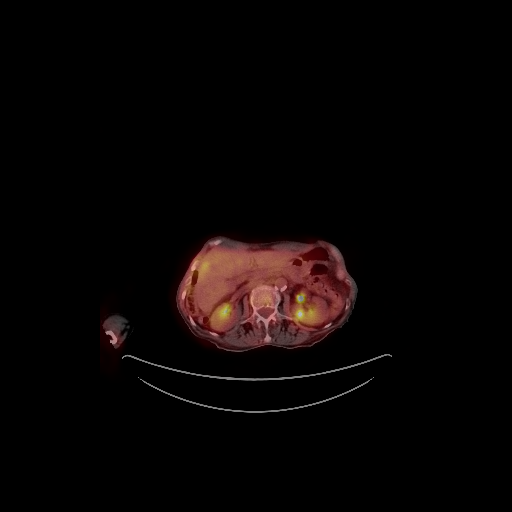

[Series 604: pet_ct coronal fused · 1 of 86 slices shown]
[im 1/86]
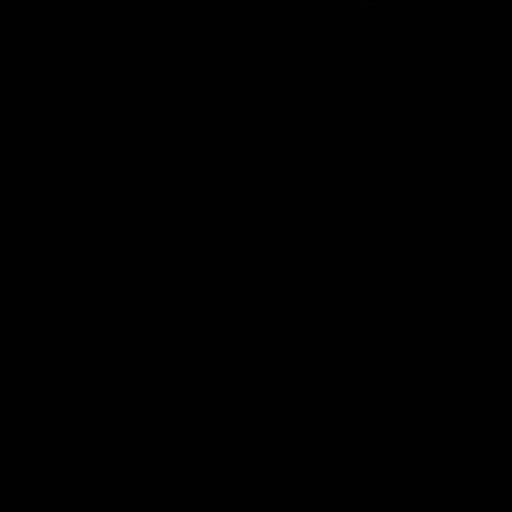

[Series 605: pet_ct sagittal fused · 2 of 152 slices shown]
[im 1/152]
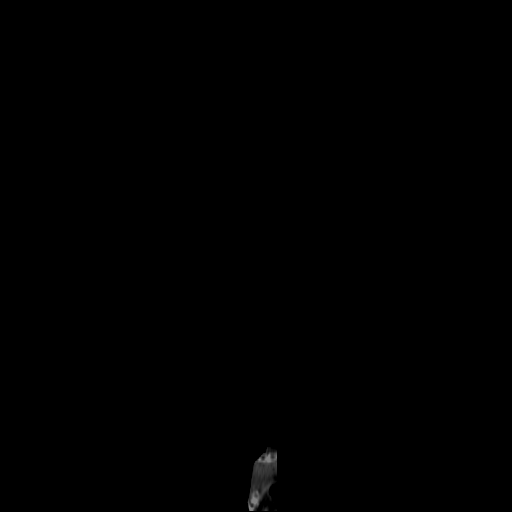
[im 152/152]
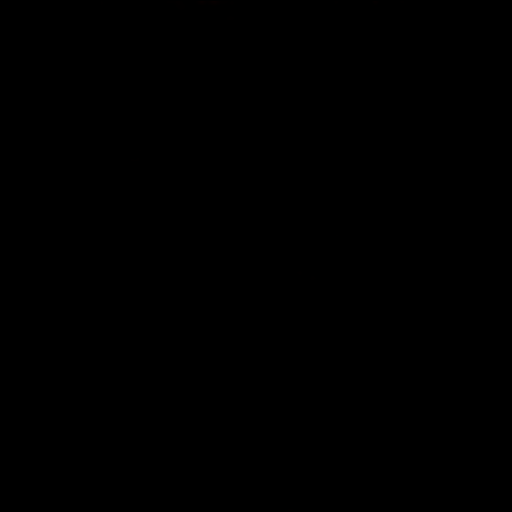

[Series 606: pet axial · 4 of 244 slices shown]
[im 1/244]
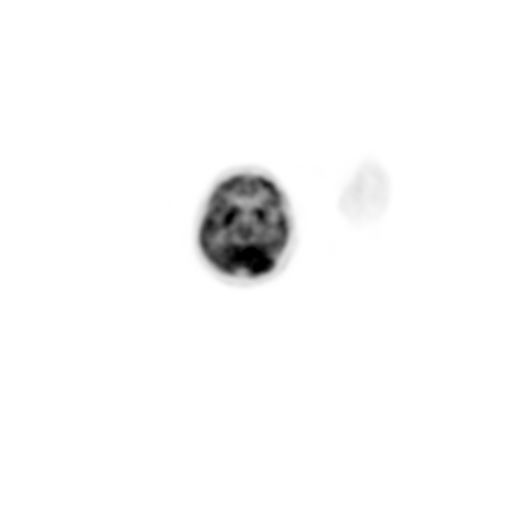
[im 82/244]
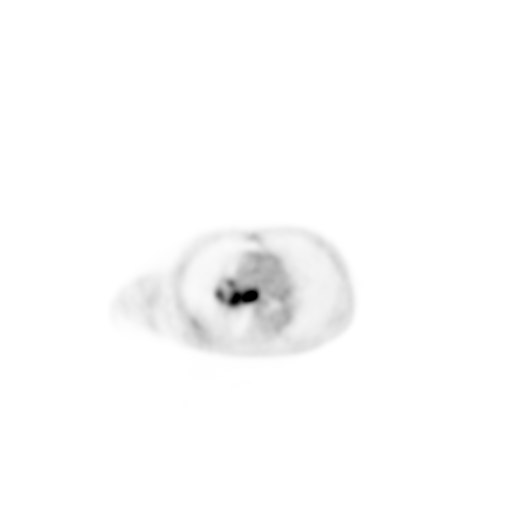
[im 163/244]
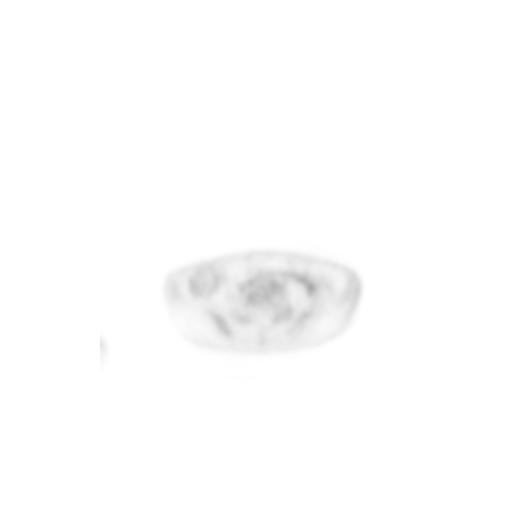
[im 244/244]
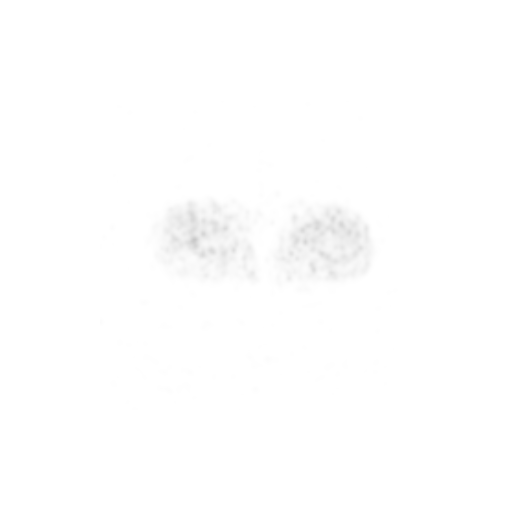

[Series 607: pet coronal · 2 of 121 slices shown]
[im 1/121]
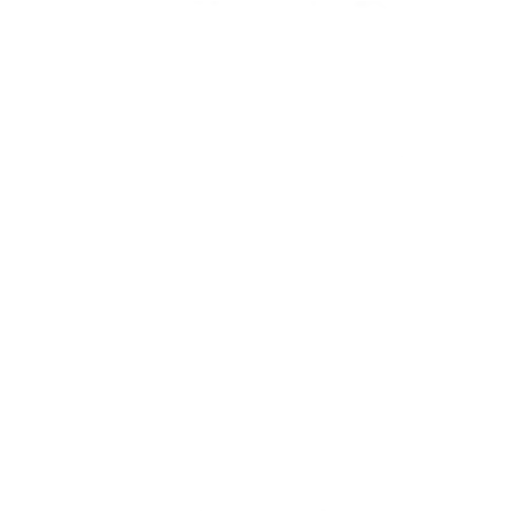
[im 121/121]
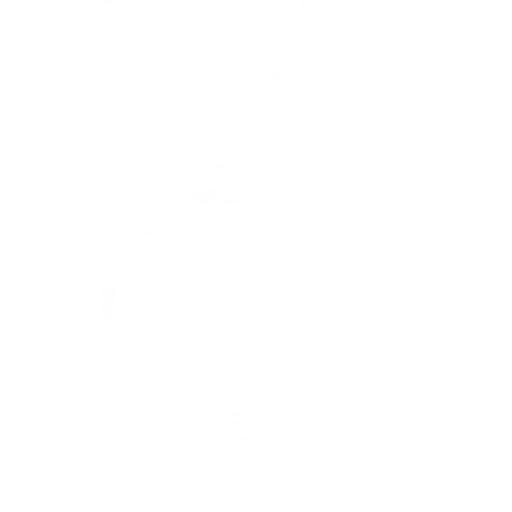

[Series 608: pet sagittal · 2 of 152 slices shown]
[im 1/152]
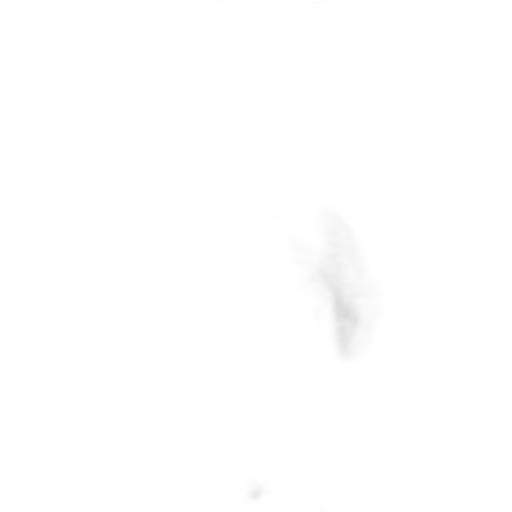
[im 152/152]
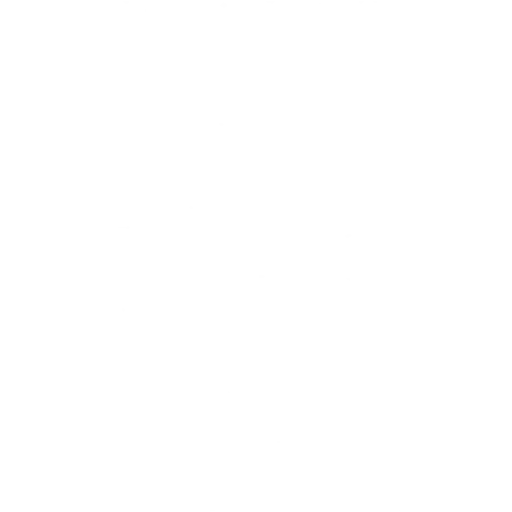

[Series 1082: results mm oncology reading · 1.0mm · 0.88mm/px · 1 of 5 slices shown]
[im 1/5]
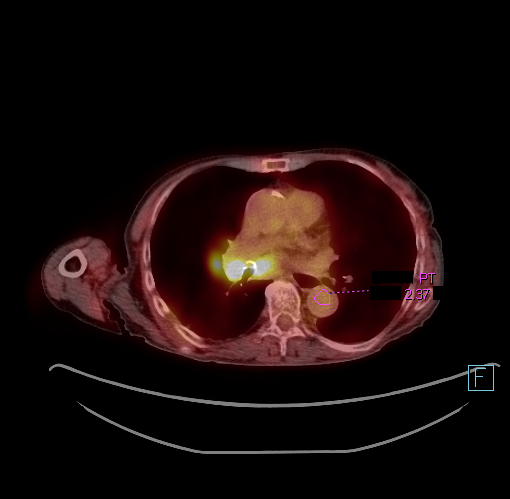

[24 of 25 positions shown; findings below may reference images not displayed]

FINDINGS: Mediastinal blood pool activity: SUV max

NECK:

No hypermetabolic cervical lymph nodes are identified.There are no
lesions of the pharyngeal mucosal space.

Incidental CT findings: none

CHEST:

The previously demonstrated confluent mediastinal and right hilar
adenopathy is hypermetabolic with central necrosis. A right
paratracheal component measuring up to 4.4 x 3.9 cm on image 76/3
has an SUV max of 11.8. Contiguous subcarinal component has an SUV
max of 13.3. There is a separate high right paratracheal node
measuring 6 mm on image 60/3 which has an SUV max of 4.3. There are
small hypermetabolic nodes within the AP window, but no
contralateral hilar adenopathy. Contiguous right upper lobe mass
measuring 4.2 x 3.6 cm on image 72/3 also demonstrates central
necrosis and peripheral hypermetabolic activity. No separate
suspicious pulmonary nodules are identified.

Incidental CT findings: Underlying centrilobular emphysema with
postobstructive pneumonitis in the right upper and lower lobes. The
right mainstem, right upper and middle lobe bronchi are occluded.
There is atherosclerosis of the aorta, great vessels and coronary
arteries.

ABDOMEN/PELVIS:

There is ill-defined mildly increased metabolic activity
peripherally in the inferior aspect of the right hepatic lobe. This
is near the costal margin and has an SUV max of 4.2. There is no
clear corresponding lesion on the CT images. The significance of
this finding is uncertain, although metastatic disease is not
strongly suspected. There is no hypermetabolic activity within the
adrenal glands, spleen or pancreas. There is no hypermetabolic nodal
activity.

Incidental CT findings: Aortic and branch vessel atherosclerosis.
Possible pelvic floor laxity.

SKELETON:

There is no hypermetabolic activity to suggest osseous metastatic
disease. Grossly stable compression fractures at T10, T11 and T12
post spinal augmentation at T12. Low level metabolic activity at T12
appears treatment related.

Incidental CT findings: Previous right proximal femoral ORIF.
IMPRESSION: 1. The previously demonstrated right upper lobe lung mass and the
confluent right hilar and mediastinal adenopathy are hypermetabolic
consistent with bronchogenic carcinoma. Central necrosis suggests
squamous cell carcinoma.
2. No definite distant metastases. Peripheral metabolic activity in
the inferior aspect of the right hepatic lobe is relatively linear
in orientation and without clear corresponding finding on the CT
images.
3. Stable compression deformities at T10, T11 and T12.

## 2019-10-27 MED ORDER — FLUDEOXYGLUCOSE F - 18 (FDG) INJECTION
5.2000 | Freq: Once | INTRAVENOUS | Status: AC | PRN
  Administered 2019-10-27: 5.5 via INTRAVENOUS

## 2019-10-27 NOTE — Telephone Encounter (Signed)
Anne Hardy will not be having her Broncoscopy until next Friday. Benjamine Mola (caregiver) left a VM message. I am moving her f/u out 10 days from that date.

## 2019-10-28 ENCOUNTER — Telehealth: Payer: Self-pay | Admitting: *Deleted

## 2019-10-28 NOTE — Telephone Encounter (Signed)
Patient sister Anne Hardy called repotting that patient is in severe pain causing shortness of breath. She cannot get into the pain clinic until 11/16/19 and sister is asking if we caould help her with perhaps something long acting instead of just the Norco every 3 hours. Please advise

## 2019-10-28 NOTE — Telephone Encounter (Signed)
Called and spoke to patient. Pain is in her neck and has been chronic. She has been seen by physical medicine and received injections previously. Awaiting appt with pain management in august. I reviewed PET which didn't show obvious bone mets or correlation between pain and apparent disease. Therefore, I recommend she contact her PCP and care team who has been managing this for her for further interventions. Patient agreeable with plan.

## 2019-10-31 NOTE — Progress Notes (Signed)
[facility]  [address] [place], [place] [address] Phone: [phone]  Fax: [phone]   Telemedicine Office Visit:  11/01/2019  Referring physician: Toni Arthurs, NP  I connected with Anne Hardy on 11/01/2019 at 1:07 PM by videoconferencing and verified that I was speaking with the correct person using 2 identifiers.  The patient was at home.  I discussed the limitations, risk, security and privacy concerns of performing an evaluation and management service by videoconferencing and the availability of in person appointments.  I also discussed with the patient that there may be a patient responsible charge related to this service.  The patient expressed understanding and agreed to proceed.   Chief Complaint: Anne Hardy is a 69 y.o. female with a right lung mass and mediastinal adenopathy who is seen today for review of imaging and discussion regarding direction of therapy.   HPI:   The patient was last seen in the medical oncology clinic on 10/22/2019 for an initial consult.  Chest CT on 10/07/2019 was felt c/w stage III lung cancer.  At that time, she was fatigued. She had a chronic cough and shortness of breath. She had a chronic headache she relates to allergies. Exam revealed upper airway sounds and wheezing. I encouraged her to have a nutrition consult but patient declined. I educated her on the importance of caloric intake.  PETscan was scheduled.  Dr Lanney Gins and Dr Raul Del were contacted for potential bronchoscopy.  Work up included hematocrit 33.7, hemoglobin 11.1, platelets 709,000, WBC 17,000 (ANC 13,600). Ferritin was 114 with an iron saturation of 11% and a TIBC of 241. Sodium 133. Calcium was 8.8. Albumin was 3.2.  LDH was 98. CEA 4.7. B12 was 568 with folate 5.7.   PET scan on 10/27/2019 revealed previously demonstrated right upper lobe lung mass and the confluent right hilar and mediastinal adenopathy were hypermetabolic c/w  bronchogenic carcinoma. Central necrosis suggested squamous cell carcinoma. There were no definite distant metastases. Peripheral metabolic activity in the inferior aspect of the right hepatic lobe is relatively linear in orientation and without clear corresponding finding on the CT images. There were stable compression deformities at T10, T11 and T12.  During the interim, she has not feel any better. Her symptoms are unchanged. She continues to lose weight. She weighs 88 lbs today. Patient declined nutrition consult. Yesterday she ate a bowl of cereal, half a sandwich, and a small portion size dinner. Sometimes she will eat a PB&J sandwich. Her sister is making smoothies high in protein. She is lactose intolerant so she can not drink ensure/boost.   She has mild headaches that are related to allergies. Her breathing is better. Anne Hardy notes that she is getting some oxygen today. She will have a bronchoscopy on 11/05/2019 with Dr. Lanney Gins.   Past Medical History:  Diagnosis Date  . Arthritis   . Cervical disc disease   . COPD (chronic obstructive pulmonary disease) ([place])   . Dyspnea   . GERD (gastroesophageal reflux disease)   . Headache    severe headaches  . Stroke [facility]) 06/2018   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-no residual effedcts-this happened in [place]-Pt just moved to [place] 2 weeks ago (October 2020)    Past Surgical History:  Procedure Laterality Date  . COLONOSCOPY WITH ESOPHAGOGASTRODUODENOSCOPY (EGD)    . femur fx repair    . FRACTURE SURGERY  2018   femur fx  . KYPHOPLASTY N/A 01/28/2019   Procedure: T12 KYPHOPLASTY;  Surgeon: Hessie Knows, MD;  Location: [facility] ORS;  Service: Orthopedics;  Laterality: N/A;  . OOPHORECTOMY Left     History reviewed. No pertinent family history.  Social History:  reports that she has been smoking cigarettes. She has smoked for the past 50.00 years. She has never used smokeless tobacco. She reports current alcohol use. She  reports that she does not use drugs. She has smoked for the past 50+ years. She previously smoked 3/4 ppd.  She currently smokes 5-6 cigarettes/day.  She drinks one drink per night (Kahlua and cream). The patient grew up in [place] and then moved to [place], [place] for 40 years. She moved to [place] in 12/2018 to live with her sister who also acts as her caregiver. 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 The patient is accompanied by her caregiver/sister, Anne Hardy today.  Participants in the patient's visit and their role in the encounter included the patient, Anne Hardy and Vito Berger, CMA, today.  The intake visit was provided by Vito Berger, CMA.  Allergies:  Allergies  Allergen Reactions  . Lactose Intolerance (Gi)     Current Medications: Current Outpatient Medications  Medication Sig Dispense Refill  . acetaminophen (TYLENOL) 500 MG tablet Take 1,000 mg by mouth 3 (three) times daily as needed (pain.).    Marland Kitchen acetaminophen-codeine (TYLENOL #3) 300-30 MG tablet Take 1 tablet by mouth 2 (two) times daily as needed for moderate pain.    Marland Kitchen albuterol (VENTOLIN HFA) 108 (90 Base) MCG/ACT inhaler Inhale 1-2 puffs into the lungs every 4 (four) hours as needed for wheezing or shortness of breath.    Marland Kitchen alendronate (FOSAMAX) 70 MG tablet Take 70 mg by mouth every Tuesday.     Marland Kitchen BREO ELLIPTA 100-25 MCG/INH AEPB Inhale 1 puff into the lungs daily as needed (asthma).     . cyclobenzaprine (FLEXERIL) 5 MG tablet Take 1 tablet (5 mg total) by mouth 2 (two) times daily as needed for muscle spasms. (Patient taking differently: Take 5 mg by mouth at bedtime. And prn) 30 tablet 0  . famotidine (PEPCID) 40 MG tablet Take 40 mg by mouth every evening.    . gabapentin (NEURONTIN) 100 MG capsule Take 100 mg by mouth 3 (three) times daily.     Marland Kitchen HYDROcodone-acetaminophen (NORCO/VICODIN) 5-325 MG tablet Take 1 tablet by mouth every 6 (six) hours as needed (pain).  15 tablet 0  . ipratropium-albuterol (DUONEB) 0.5-2.5 (3) MG/3ML SOLN Take 3 mLs by nebulization in the morning, at noon, and at bedtime.    . Multiple Vitamin (MULTIVITAMIN WITH MINERALS) TABS tablet Take 1 tablet by mouth daily.    . Probiotic Product (PROBIOTIC DAILY PO) Take 1 capsule by mouth daily.    . traZODone (DESYREL) 50 MG tablet Take 50 mg by mouth at bedtime.    . Ascorbic Acid (VITAMIN C) 1000 MG tablet Take 1,000 mg by mouth daily. (Patient not taking: Reported on 11/01/2019)    . B Complex-C (B-COMPLEX WITH VITAMIN C) tablet Take 1 tablet by mouth daily. (Patient not taking: Reported on 11/01/2019)    . Calcium Carb-Cholecalciferol (CALCIUM 500 + D3 PO) Take 1 tablet by mouth daily. (Patient not taking: Reported on 11/01/2019)     No current facility-administered medications for this visit.    Review of Systems  Constitutional: Positive for malaise/fatigue and weight loss (weighs 88 lbs). Negative for chills, diaphoresis and fever.       Does  not feel any better.  HENT: Negative.  Negative for congestion, ear discharge, ear pain, hearing loss, nosebleeds, sinus pain, sore throat and tinnitus.   Eyes: Negative.  Negative for blurred vision.  Respiratory: Positive for cough, sputum production (occasional mucus) and shortness of breath (x 2-3 nights, better). Negative for hemoptysis.   Cardiovascular: Negative.  Negative for chest pain, palpitations and leg swelling.  Gastrointestinal: Positive for abdominal pain. Negative for blood in stool, constipation, diarrhea, heartburn, melena, nausea and vomiting.       Decreased appetite. Lactose intolerant. Drinking protein smoothies.  Genitourinary: Negative.  Negative for dysuria, frequency, hematuria and urgency.  Musculoskeletal: Positive for joint pain (right shoulder) and neck pain (pinched nerves at C5-C6). Negative for back pain and myalgias.  Skin: Negative.  Negative for itching and rash.  Neurological: Positive for weakness  (worsening) and headaches (mild, felt due to allergies). Negative for dizziness, tingling and sensory change.  Endo/Heme/Allergies: Negative.  Does not bruise/bleed easily.  Psychiatric/Behavioral: Negative.  Negative for depression and memory loss. The patient is not nervous/anxious and does not have insomnia.   All other systems reviewed and are negative.  Performance status (ECOG): 2  Physical Exam Nursing note reviewed.  Constitutional:      General: She is not in acute distress.    Appearance: She is not diaphoretic.  HENT:     Head:     Comments: Short gray hair. Eyes:     General: No scleral icterus.    Extraocular Movements: Extraocular movements intact.     Conjunctiva/sclera: Conjunctivae normal.     Comments: Blue eyes.  Abdominal:     Palpations: There is no hepatomegaly or splenomegaly.  Neurological:     Mental Status: She is alert and oriented to person, place, and time. Mental status is at baseline.  Psychiatric:        Mood and Affect: Mood normal.        Behavior: Behavior normal.        Thought Content: Thought content normal.        Judgment: Judgment normal.    Hospital Outpatient Visit on 11/01/2019  Component Date Value Ref Range Status  . Prothrombin Time 11/01/2019 13.2  11.4 - 15.2 seconds Final  . INR 11/01/2019 1.0  0.8 - 1.2 Final   Comment: (NOTE) INR goal varies based on device and disease states. Performed at [facility], [address], [place], [place] [address]   . aPTT 11/01/2019 35  24 - 36 seconds Final   Performed at [facility], [address] [place], [place] [address]  . WBC 11/01/2019 20.8* 4.0 - 10.5 K/uL Final  . RBC 11/01/2019 4.50  3.87 - 5.11 MIL/uL Final  . Hemoglobin 11/01/2019 12.0  12.0 - 15.0 g/dL Final  . HCT 11/01/2019 37.3  36 - 46 % Final  . MCV 11/01/2019 82.9  80.0 - 100.0 fL Final  . MCH 11/01/2019 26.7  26.0 - 34.0 pg Final  . MCHC 11/01/2019 32.2  30.0 - 36.0 g/dL Final  . RDW  11/01/2019 15.9* 11.5 - 15.5 % Final  . Platelets 11/01/2019 731* 150 - 400 K/uL Final  . nRBC 11/01/2019 0.0  0.0 - 0.2 % Final   Performed at [facility] [address] [place], [place] [address]  . Neutrophils Relative % 11/01/2019 85  % Final  . Neutro Abs 11/01/2019 17.6* 1.7 - 7.7 K/uL Final  . Lymphocytes Relative 11/01/2019 8  % Final  . Lymphs  Abs 11/01/2019 1.6  0.7 - 4.0 K/uL Final  . Monocytes Relative 11/01/2019 6  % Final  . Monocytes Absolute 11/01/2019 1.3* 0 - 1 K/uL Final  . Eosinophils Relative 11/01/2019 0  % Final  . Eosinophils Absolute 11/01/2019 0.1  0 - 0 K/uL Final  . Basophils Relative 11/01/2019 0  % Final  . Basophils Absolute 11/01/2019 0.1  0 - 0 K/uL Final  . Immature Granulocytes 11/01/2019 1  % Final  . Abs Immature Granulocytes 11/01/2019 0.19* 0.00 - 0.07 K/uL Final   Performed at [facility], [address] [place], [place] [address]    Assessment:  Anne Hardy is a 69 y.o. female with a right lung mass and mediastinal adenopathy.  She has a > 35 pack year smoking history.  She presented with cough, shortness of breath, and weight loss.  Chest CT with contrast on 10/07/2019 revealed a rounded 4.0 x 3.9 cm suprahilar mass of the posterior right upper lobe. There was associated soft tissue and or mucous obstruction/impaction of the right mainstem bronchus and the right upper lobe bronchi. There was bulky pretracheal, right hilar, and subcarinal lymphadenopathy. The largest pretracheal node or conglomerate node measured 4.1 x 3.3 cm.  Findings were consistent with primary lung malignancy with nodal metastatic disease. There was emphysema, trace bilateral pleural effusions, and trace pericardial effusion.  PET scan on 10/27/2019 revealed previously demonstrated right upper lobe lung mass and the confluent right hilar and mediastinal adenopathy were hypermetabolic c/w bronchogenic carcinoma. Central necrosis suggested squamous cell  carcinoma. There were no definite distant metastases. Peripheral metabolic activity in the inferior aspect of the right hepatic lobe is relatively linear in orientation and without clear corresponding finding on the CT images. There were stable compression deformities at T10, T11 and T12.  She has a history of a CVA on 06/2018. She is on Eliquis.  The patient received the Moderna COVID-19 vaccine on 06/23/2019 and 07/21/2019.  Symptomatically, she feels the same.  She has lost weight.  She has mild headaches.  Plan: 1.   Review PET scan imaging. 2.   Right lung mass  Discuss concern for stage III lung cancer.  Review PET scan with patient.  Images personally reviewed.  Agree with radiology interpretation   She has a hypermetabolic RUL lung mass with confluent hilar and mediastinal adenopathy.   She appears to have no evidence of metastatic disease.  Discuss plan for bronchoscopy on 11/05/2019.  Anticipate concurrent chemotherapy and radiation followed by durvalumab if pathology c/w non-small cell lung cancer.  Discuss head MRI- patient agrees.  Present at tumor board on 11/11/2019. 3.   Normocytic anemia  Hematocrit 33.7.  Hemoglobin 11.1 and MCV 83.6 on 10/22/2019.   Ferritin 114 with an iron saturation of 11% and a TIBC of 241 on 10/22/2019.   B12 568 and folate 5.7 (low) on 10/22/2019.  Hematocrit 37.3.  Hemoglobin 12.0 and MCV 82.9 on 11/01/2019. 4.   Weight loss  Patient notes ongoing weight loss.  Discuss importance of caloric intake.  Patient denies referral to dietitian. 5.   Head MRI on 11/09/2019. 6.   Tumor board on 07/29/20201. 7.   RTC on 11/10/2019 via telemedicine for MD assessment, review of pathology from bronchoscopy and review of head MRI.  I discussed the assessment and treatment plan with the patient.  The patient was provided an opportunity to ask questions and all were answered.  The patient agreed with the plan and demonstrated an understanding of the  instructions.  The patient was advised to call back if the symptoms worsen or if the condition fails to improve as anticipated.  I provided 17 minutes (1:07 PM - 1:24 PM) of face-to-face video visit time during this this encounter and > 50% was spent counseling as documented under my assessment and plan.  I provided these services from the [facility] office.   An additional 5+ minutes were spent reviewing her chart ([place]) including notes, labs, and imaging studies.    Henrietta Cieslewicz C. Mike Gip, MD, PhD    10/21/2019, 11:29 AM  I, Selena Batten, am acting as scribe for [employer]. Mike Gip, MD, PhD.  I, Margarite Vessel C. Mike Gip, MD, have reviewed the above documentation for accuracy and completeness, and I agree with the above.

## 2019-11-01 ENCOUNTER — Encounter: Payer: Self-pay | Admitting: Hematology and Oncology

## 2019-11-01 ENCOUNTER — Other Ambulatory Visit: Payer: Self-pay

## 2019-11-01 ENCOUNTER — Encounter
Admission: RE | Admit: 2019-11-01 | Discharge: 2019-11-01 | Disposition: A | Discharge status: Home / Self Care | Payer: Medicare Other | Source: Ambulatory Visit | Attending: Pulmonary Disease | Admitting: Pulmonary Disease

## 2019-11-01 ENCOUNTER — Ambulatory Visit: Payer: Medicare Other

## 2019-11-01 ENCOUNTER — Encounter: Payer: Self-pay | Admitting: Urgent Care

## 2019-11-01 ENCOUNTER — Inpatient Hospital Stay (HOSPITAL_BASED_OUTPATIENT_CLINIC_OR_DEPARTMENT_OTHER): Payer: Medicare Other | Admitting: Hematology and Oncology

## 2019-11-01 DIAGNOSIS — R918 Other nonspecific abnormal finding of lung field: Secondary | ICD-10-CM | POA: Diagnosis not present

## 2019-11-01 DIAGNOSIS — Z8673 Personal history of transient ischemic attack (TIA), and cerebral infarction without residual deficits: Secondary | ICD-10-CM | POA: Diagnosis not present

## 2019-11-01 DIAGNOSIS — Z85118 Personal history of other malignant neoplasm of bronchus and lung: Secondary | ICD-10-CM | POA: Diagnosis not present

## 2019-11-01 DIAGNOSIS — E538 Deficiency of other specified B group vitamins: Secondary | ICD-10-CM

## 2019-11-01 DIAGNOSIS — Z01818 Encounter for other preprocedural examination: Secondary | ICD-10-CM | POA: Insufficient documentation

## 2019-11-01 DIAGNOSIS — R634 Abnormal weight loss: Secondary | ICD-10-CM

## 2019-11-01 HISTORY — DX: Dyspnea, unspecified: R06.00

## 2019-11-01 HISTORY — DX: Chronic obstructive pulmonary disease, unspecified: J44.9

## 2019-11-01 HISTORY — DX: Cervical disc disorder, unspecified, unspecified cervical region: M50.90

## 2019-11-01 LAB — CBC
HCT: 37.3 % (ref 36.0–46.0)
Hemoglobin: 12 g/dL (ref 12.0–15.0)
MCH: 26.7 pg (ref 26.0–34.0)
MCHC: 32.2 g/dL (ref 30.0–36.0)
MCV: 82.9 fL (ref 80.0–100.0)
Platelets: 731 10*3/uL — ABNORMAL HIGH (ref 150–400)
RBC: 4.5 MIL/uL (ref 3.87–5.11)
RDW: 15.9 % — ABNORMAL HIGH (ref 11.5–15.5)
WBC: 20.8 10*3/uL — ABNORMAL HIGH (ref 4.0–10.5)
nRBC: 0 % (ref 0.0–0.2)

## 2019-11-01 LAB — DIFFERENTIAL
Abs Immature Granulocytes: 0.19 10*3/uL — ABNORMAL HIGH (ref 0.00–0.07)
Basophils Absolute: 0.1 10*3/uL (ref 0.0–0.1)
Basophils Relative: 0 %
Eosinophils Absolute: 0.1 10*3/uL (ref 0.0–0.5)
Eosinophils Relative: 0 %
Immature Granulocytes: 1 %
Lymphocytes Relative: 8 %
Lymphs Abs: 1.6 10*3/uL (ref 0.7–4.0)
Monocytes Absolute: 1.3 10*3/uL — ABNORMAL HIGH (ref 0.1–1.0)
Monocytes Relative: 6 %
Neutro Abs: 17.6 10*3/uL — ABNORMAL HIGH (ref 1.7–7.7)
Neutrophils Relative %: 85 %

## 2019-11-01 LAB — PROTIME-INR
INR: 1 (ref 0.8–1.2)
Prothrombin Time: 13.2 seconds (ref 11.4–15.2)

## 2019-11-01 LAB — APTT: aPTT: 35 seconds (ref 24–36)

## 2019-11-01 NOTE — Progress Notes (Signed)
°  [facility] Perioperative Services: Pre-Admission/Anesthesia Testing  Abnormal Lab Notification    Date: 11/01/19  Name: Anne Hardy MRN:   [id number]  Re: Abnormal labs noted during PAT appointment   Provider Notified: Ottie Glazier, MD and Nolon Stalls, MD Notification mode: Routed via [facility]   ABNORMAL LAB VALUE(S):  WBC 20.8 (ANC 17.6 K/uL)  Monocytes 1.3 K/uL  Platelets 731 K/uL  Note: Patient scheduled for ENB/EBUS on 11/05/2019 with Dr. Lanney Gins.   Honor Loh, MSN, APRN, FNP-C, CEN [facility]  Peri-operative Services Nurse Practitioner Phone: [phone] 11/01/19 3:11 PM

## 2019-11-01 NOTE — Progress Notes (Signed)
No new changes noted today 

## 2019-11-01 NOTE — Patient Instructions (Signed)
Your procedure is scheduled on: 11/05/19 Report to [place]. To find out your arrival time please call [phone] between 1PM - 3PM on 11/04/19.  Remember: Instructions that are not followed completely may result in serious medical risk, up to and including death, or upon the discretion of your surgeon and anesthesiologist your surgery may need to be rescheduled.     _X__ 1. Do not eat food after midnight the night before your procedure.                 No gum chewing or hard candies. You may drink clear liquids up to 2 hours                 before you are scheduled to arrive for your surgery- DO not drink clear                 liquids within 2 hours of the start of your surgery.                 Clear Liquids include:  water, apple juice without pulp, clear carbohydrate                 drink such as Clearfast or Gatorade, Black Coffee or Tea (Do not add                 anything to coffee or tea). Diabetics water only  __X__2.  On the morning of surgery brush your teeth with toothpaste and water, you                 may rinse your mouth with mouthwash if you wish.  Do not swallow any              toothpaste of mouthwash.     _X__ 3.  No Alcohol for 24 hours before or after surgery.   _X__ 4.  Do Not Smoke or use e-cigarettes For 24 Hours Prior to Your Surgery.                 Do not use any chewable tobacco products for at least 6 hours prior to                 surgery.  ____  5.  Bring all medications with you on the day of surgery if instructed.   __X__  6.  Notify your doctor if there is any change in your medical condition      (cold, fever, infections).     Do not wear jewelry, make-up, hairpins, clips or nail polish. Do not wear lotions, powders, or perfumes.  Do not shave 48 hours prior to surgery. Men may shave face and neck. Do not bring valuables to the hospital.    [facility] is not responsible for any belongings or  valuables.  Contacts, dentures/partials or body piercings may not be worn into surgery. Bring a case for your contacts, glasses or hearing aids, a denture cup will be supplied. Leave your suitcase in the car. After surgery it may be brought to your room. For patients admitted to the hospital, discharge time is determined by your treatment team.   Patients discharged the day of surgery will not be allowed to drive home.   Please read over the following fact sheets that you were given:   MRSA Information  __X__ Take these medicines the morning of surgery with A SIP OF WATER:  1. famotidine (PEPCID) 40 MG tablet  2. gabapentin (NEURONTIN) 100 MG capsule  3. HYDROcodone-acetaminophen (NORCO/VICODIN) 5-325 MG tablet if needed  4.  5.  6.  ____ Fleet Enema (as directed)   __X__ Use CHG Soap/SAGE wipes as directed  __X__ Use inhalers on the day of surgery BREO ELLIPTA  AND ipratropium-albuterol (DUONEB)  ____ Stop metformin/Janumet/Farxiga 2 days prior to surgery    ____ Take 1/2 of usual insulin dose the night before surgery. No insulin the morning          of surgery.   ____ Stop Blood Thinners Coumadin/Plavix/Xarelto/Pleta/Pradaxa/Eliquis/Effient/Aspirin  on   Or contact your Surgeon, Cardiologist or Medical Doctor regarding  ability to stop your blood thinners  __X__ Stop Anti-inflammatories 7 days before surgery such as Advil, Ibuprofen, Motrin,  BC or Goodies Powder, Naprosyn, Naproxen, Aleve, Aspirin    __X__ Stop all herbal supplements, fish oil or vitamin E until after surgery.    ____ Bring C-Pap to the hospital.

## 2019-11-02 ENCOUNTER — Other Ambulatory Visit
Admission: RE | Admit: 2019-11-02 | Discharge: 2019-11-02 | Disposition: A | Discharge status: Home / Self Care | Payer: Medicare Other | Source: Ambulatory Visit | Attending: Pulmonary Disease | Admitting: Pulmonary Disease

## 2019-11-02 ENCOUNTER — Other Ambulatory Visit: Payer: Medicare Other

## 2019-11-02 ENCOUNTER — Ambulatory Visit
Admission: RE | Admit: 2019-11-02 | Discharge: 2019-11-02 | Disposition: A | Discharge status: Home / Self Care | Payer: Medicare Other | Source: Ambulatory Visit | Attending: Pulmonary Disease | Admitting: Pulmonary Disease

## 2019-11-02 DIAGNOSIS — R590 Localized enlarged lymph nodes: Secondary | ICD-10-CM | POA: Insufficient documentation

## 2019-11-02 DIAGNOSIS — Z20822 Contact with and (suspected) exposure to covid-19: Secondary | ICD-10-CM | POA: Diagnosis not present

## 2019-11-02 DIAGNOSIS — C3491 Malignant neoplasm of unspecified part of right bronchus or lung: Secondary | ICD-10-CM | POA: Diagnosis not present

## 2019-11-02 DIAGNOSIS — J189 Pneumonia, unspecified organism: Secondary | ICD-10-CM | POA: Diagnosis not present

## 2019-11-02 DIAGNOSIS — Z01812 Encounter for preprocedural laboratory examination: Secondary | ICD-10-CM | POA: Diagnosis present

## 2019-11-02 IMAGING — CT CT CHEST W/O CM
2 of 4 series · 15 of 36 positions shown, 18 images · non-contrast
Comparison: CT [DATE], PET-CT [DATE]

CLINICAL DATA: Pulmonary mass, mediastinal adenopathy, pre biopsy
planning

EXAM:
CT CHEST WITHOUT CONTRAST
TECHNIQUE: Multidetector CT imaging of the chest was performed following the
standard protocol without IV contrast.

[Series 5: coronal · coronal · 0.53mm/px · 3 of 109 slices shown]
[im 22/109  lung]
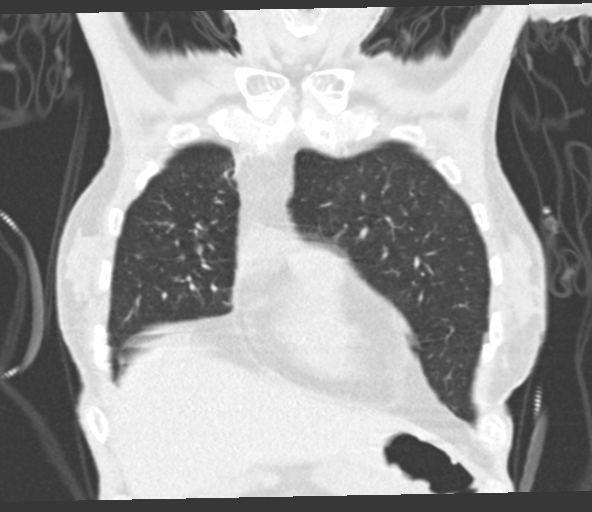
[im 44/109  lung]
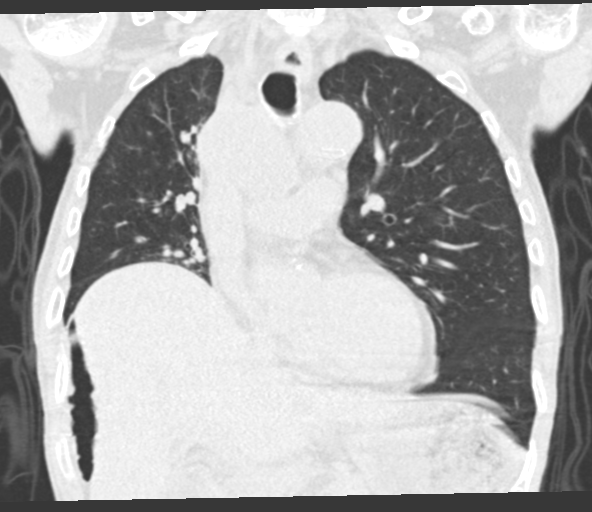
[im 65/109  lung]
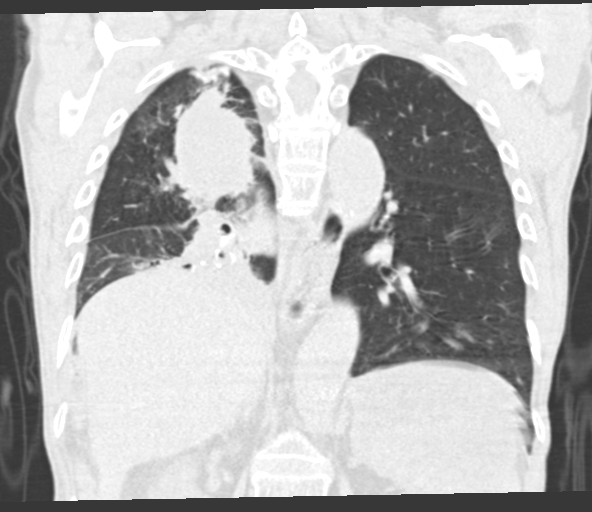

[Series 7: super d · axial · 0.55mm/px · z∈[-270,-35]mm · 12 of 321 slices shown, 15 images]
[im 14/321  mediastinal]
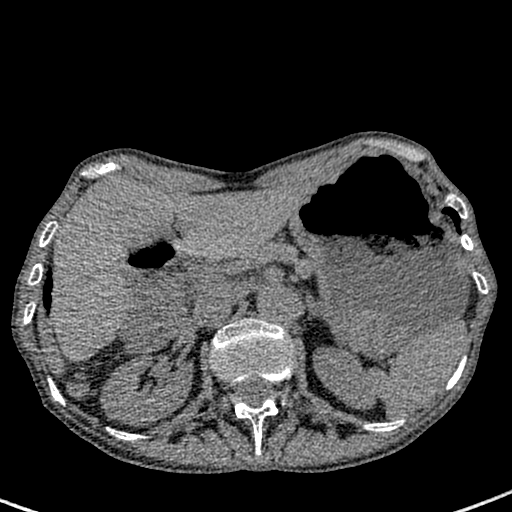
[im 14/321  lung]
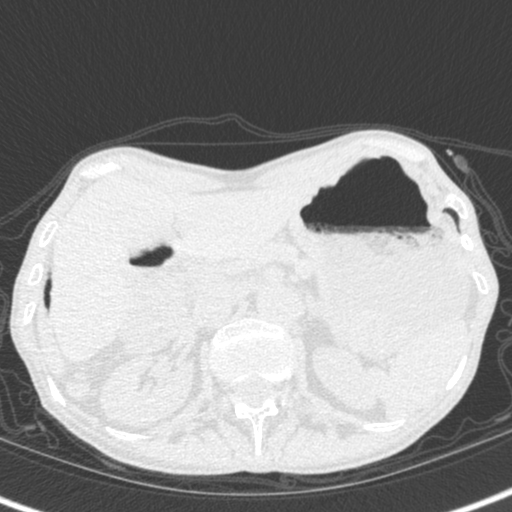
[im 42/321  lung]
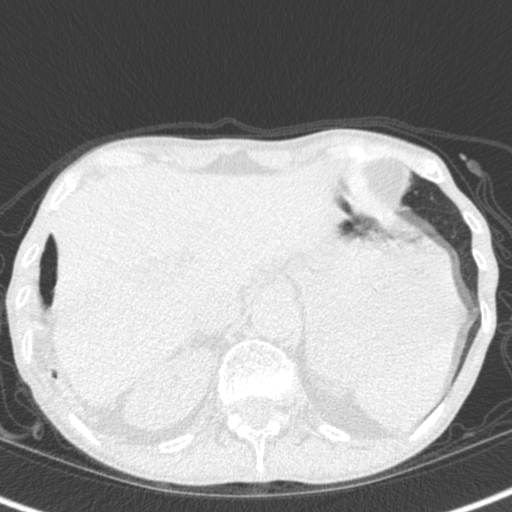
[im 70/321  lung]
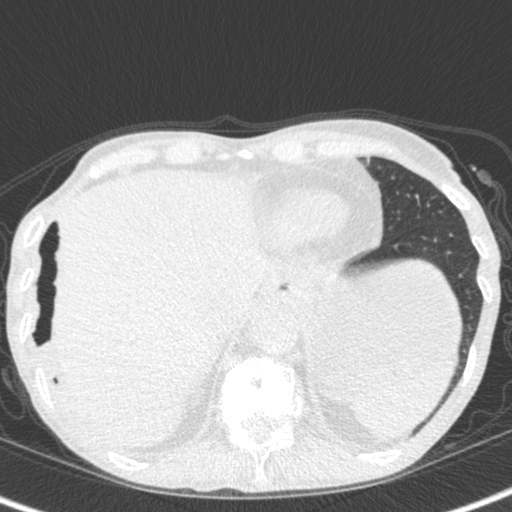
[im 98/321  lung]
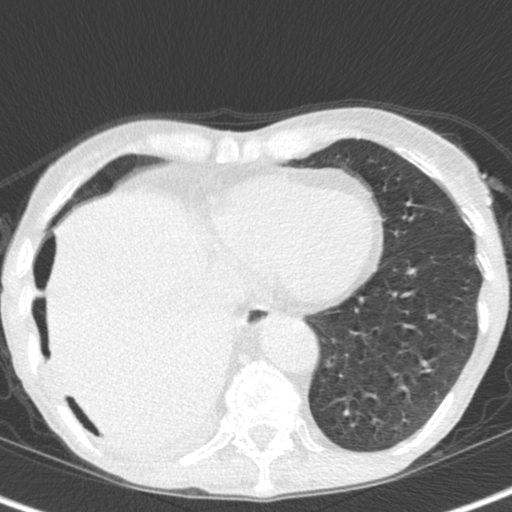
[im 126/321  mediastinal]
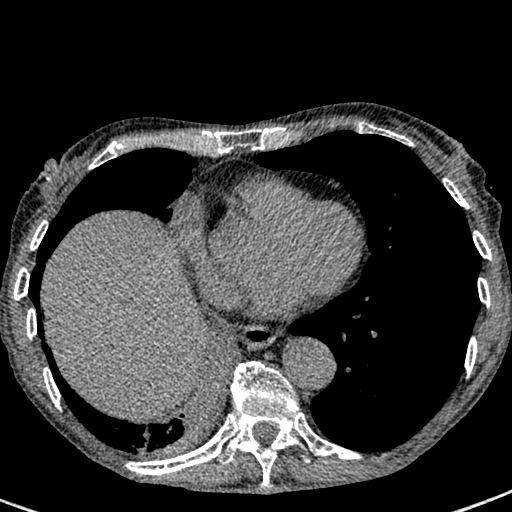
[im 126/321  lung]
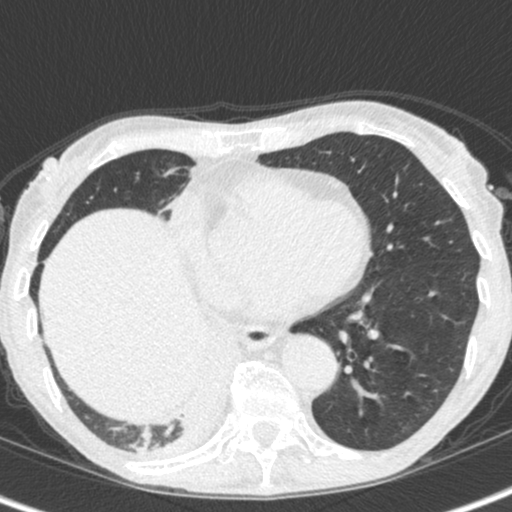
[im 154/321  lung]
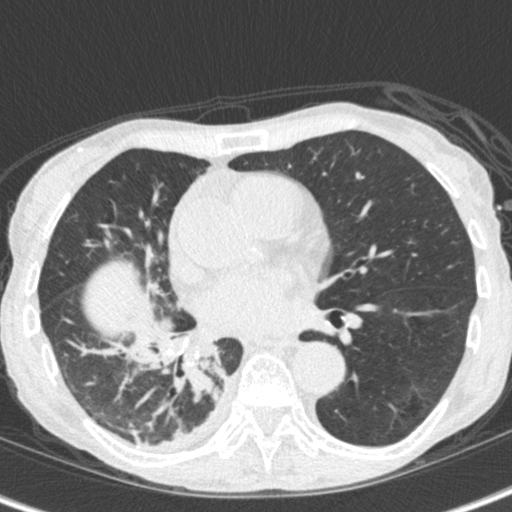
[im 167/321  lung]
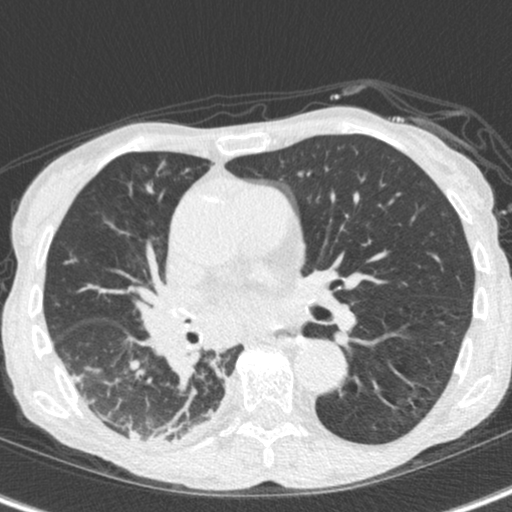
[im 195/321  lung]
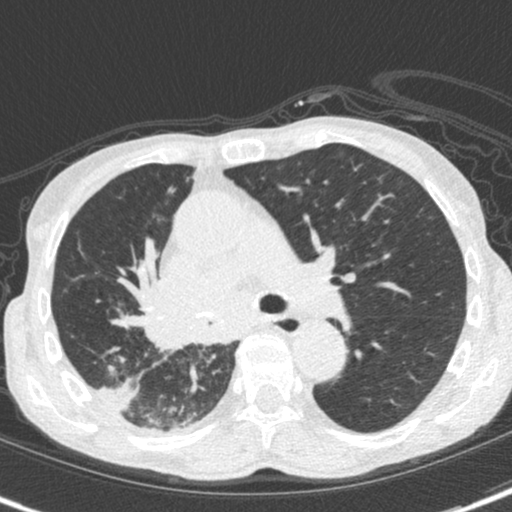
[im 223/321  mediastinal]
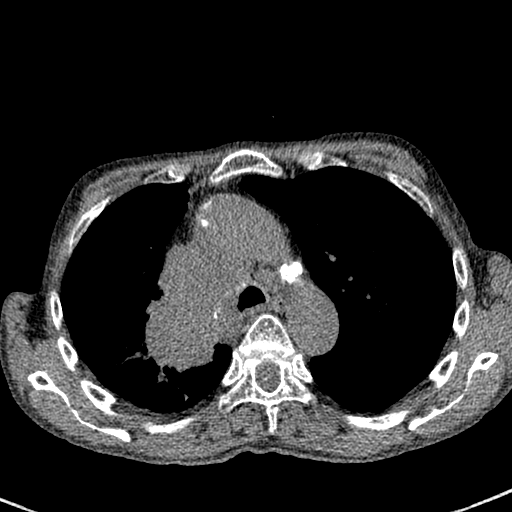
[im 223/321  lung]
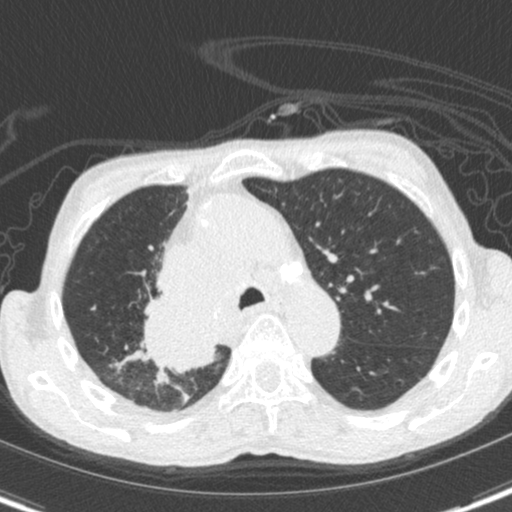
[im 251/321  lung]
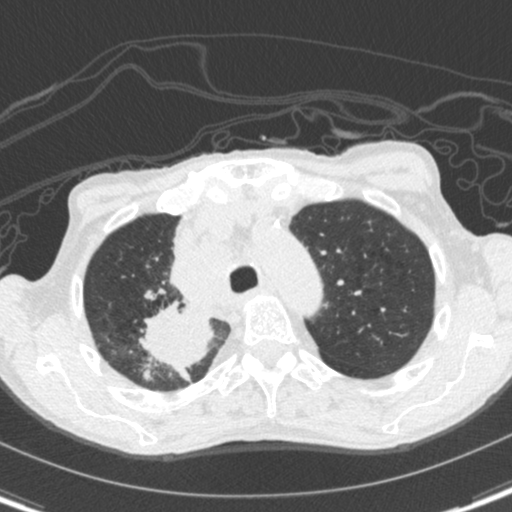
[im 279/321  lung]
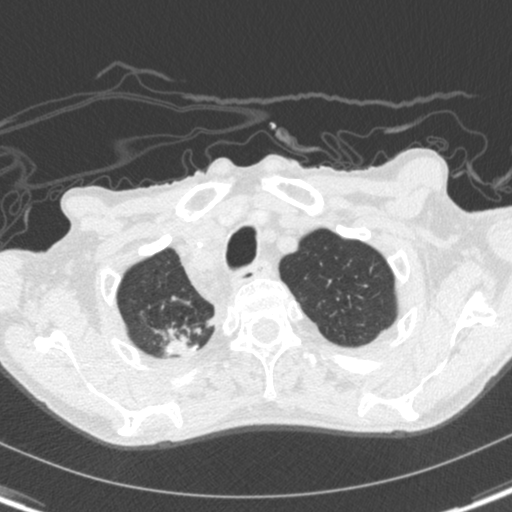
[im 307/321  lung]
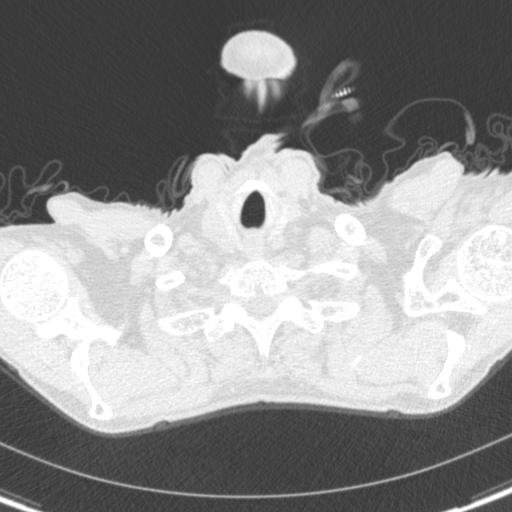

[15 of 36 positions shown; findings below may reference images not displayed]

FINDINGS: Cardiovascular: Mild coronary artery calcification. Global cardiac
size within normal limits. No pericardial effusion. Mild
atherosclerotic calcification within the thoracic aorta without
aneurysm.

Mediastinum/Nodes: Confluency right paratracheal and right hilar
adenopathy with the suprahilar mass is again identified. Distinct
measurement of the right hilar mass in paratracheal adenopathy is no
longer possible as these appears subtly confluency measuring 7.7 x
4.5 cm at axial image # [DATE]. The confluency adenopathy abuts the
ascending aorta and compresses the superior vena cava which is
difficult to differentiate on this noncontrast examination. There is
increasing soft tissue obliteration of the right mainstem bronchus
as well as complete opacification of the right upper lobe pulmonary
bronchus likely related to invasive disease with endobronchial
extension. There is airway impaction within the segmental bronchi of
the right lower lobe and right middle lobe which may represent
mucous plugging given its relatively rapid development since prior
examination. Pathologic aortopulmonary lymphadenopathy is again
identified with the lymph node measuring 13 mm in short axis
diameter at axial image # 18.

Lungs/Pleura: Progressive postobstructive pneumonitis noted within
the right upper lobe. There is increasing right-sided volume loss
and right basilar atelectasis. Left lung is clear. Trace right
pleural effusion has developed. No pneumothorax.

Upper Abdomen: Unremarkable

Musculoskeletal: Diffuse body wall wasting. T12 vertebroplasty has
been performed. No lytic or blastic bone lesions are seen. Bland
appearing T10 and T11 compression fractures with moderate to severe
loss of height are stable since prior examination.
IMPRESSION: Progressive confluence of right hilar and right paratracheal
adenopathy with the suprahilar mass, now inseparable. Increasing
soft tissue within the right mainstem bronchus, now nearly
completely obliterated, likely representing endobronchial extension
of disease. Increasing mucoid impaction within the right middle and
lower lobe with increasing right-sided volume loss and
postobstructive pneumonitis, particularly within the right upper
lobe.

## 2019-11-03 ENCOUNTER — Ambulatory Visit: Payer: Medicare Other | Admitting: Hematology and Oncology

## 2019-11-03 ENCOUNTER — Other Ambulatory Visit: Payer: Medicare Other

## 2019-11-03 LAB — SARS CORONAVIRUS 2 (TAT 6-24 HRS): SARS Coronavirus 2: NEGATIVE

## 2019-11-04 ENCOUNTER — Other Ambulatory Visit: Payer: Medicare Other

## 2019-11-05 ENCOUNTER — Ambulatory Visit: Payer: Medicare Other

## 2019-11-05 ENCOUNTER — Encounter
Admission: RE | Disposition: A | Discharge status: Home / Self Care | Payer: Self-pay | Source: Ambulatory Visit | Attending: Pulmonary Disease

## 2019-11-05 ENCOUNTER — Other Ambulatory Visit: Payer: Self-pay

## 2019-11-05 ENCOUNTER — Ambulatory Visit
Admission: RE | Admit: 2019-11-05 | Discharge: 2019-11-05 | Disposition: A | Discharge status: Home / Self Care | Payer: Medicare Other | Source: Ambulatory Visit | Attending: Pulmonary Disease | Admitting: Pulmonary Disease

## 2019-11-05 ENCOUNTER — Encounter: Payer: Self-pay | Admitting: *Deleted

## 2019-11-05 ENCOUNTER — Ambulatory Visit: Payer: Medicare Other | Admitting: Urgent Care

## 2019-11-05 DIAGNOSIS — Z8673 Personal history of transient ischemic attack (TIA), and cerebral infarction without residual deficits: Secondary | ICD-10-CM | POA: Insufficient documentation

## 2019-11-05 DIAGNOSIS — Z9981 Dependence on supplemental oxygen: Secondary | ICD-10-CM | POA: Insufficient documentation

## 2019-11-05 DIAGNOSIS — F17210 Nicotine dependence, cigarettes, uncomplicated: Secondary | ICD-10-CM | POA: Insufficient documentation

## 2019-11-05 DIAGNOSIS — C771 Secondary and unspecified malignant neoplasm of intrathoracic lymph nodes: Secondary | ICD-10-CM | POA: Diagnosis not present

## 2019-11-05 DIAGNOSIS — R918 Other nonspecific abnormal finding of lung field: Secondary | ICD-10-CM | POA: Diagnosis present

## 2019-11-05 DIAGNOSIS — C3401 Malignant neoplasm of right main bronchus: Secondary | ICD-10-CM | POA: Diagnosis not present

## 2019-11-05 DIAGNOSIS — J449 Chronic obstructive pulmonary disease, unspecified: Secondary | ICD-10-CM | POA: Insufficient documentation

## 2019-11-05 DIAGNOSIS — R9389 Abnormal findings on diagnostic imaging of other specified body structures: Secondary | ICD-10-CM

## 2019-11-05 HISTORY — PX: VIDEO BRONCHOSCOPY WITH ENDOBRONCHIAL NAVIGATION: SHX6175

## 2019-11-05 HISTORY — PX: VIDEO BRONCHOSCOPY WITH ENDOBRONCHIAL ULTRASOUND: SHX6177

## 2019-11-05 IMAGING — DX DG CHEST 1V PORT
1 series · 1 of 1 positions shown · non-contrast
Comparison: CT [DATE].

CLINICAL DATA: Status post bronchoscopy.

EXAM:
PORTABLE CHEST 1 VIEW

[chest ap]
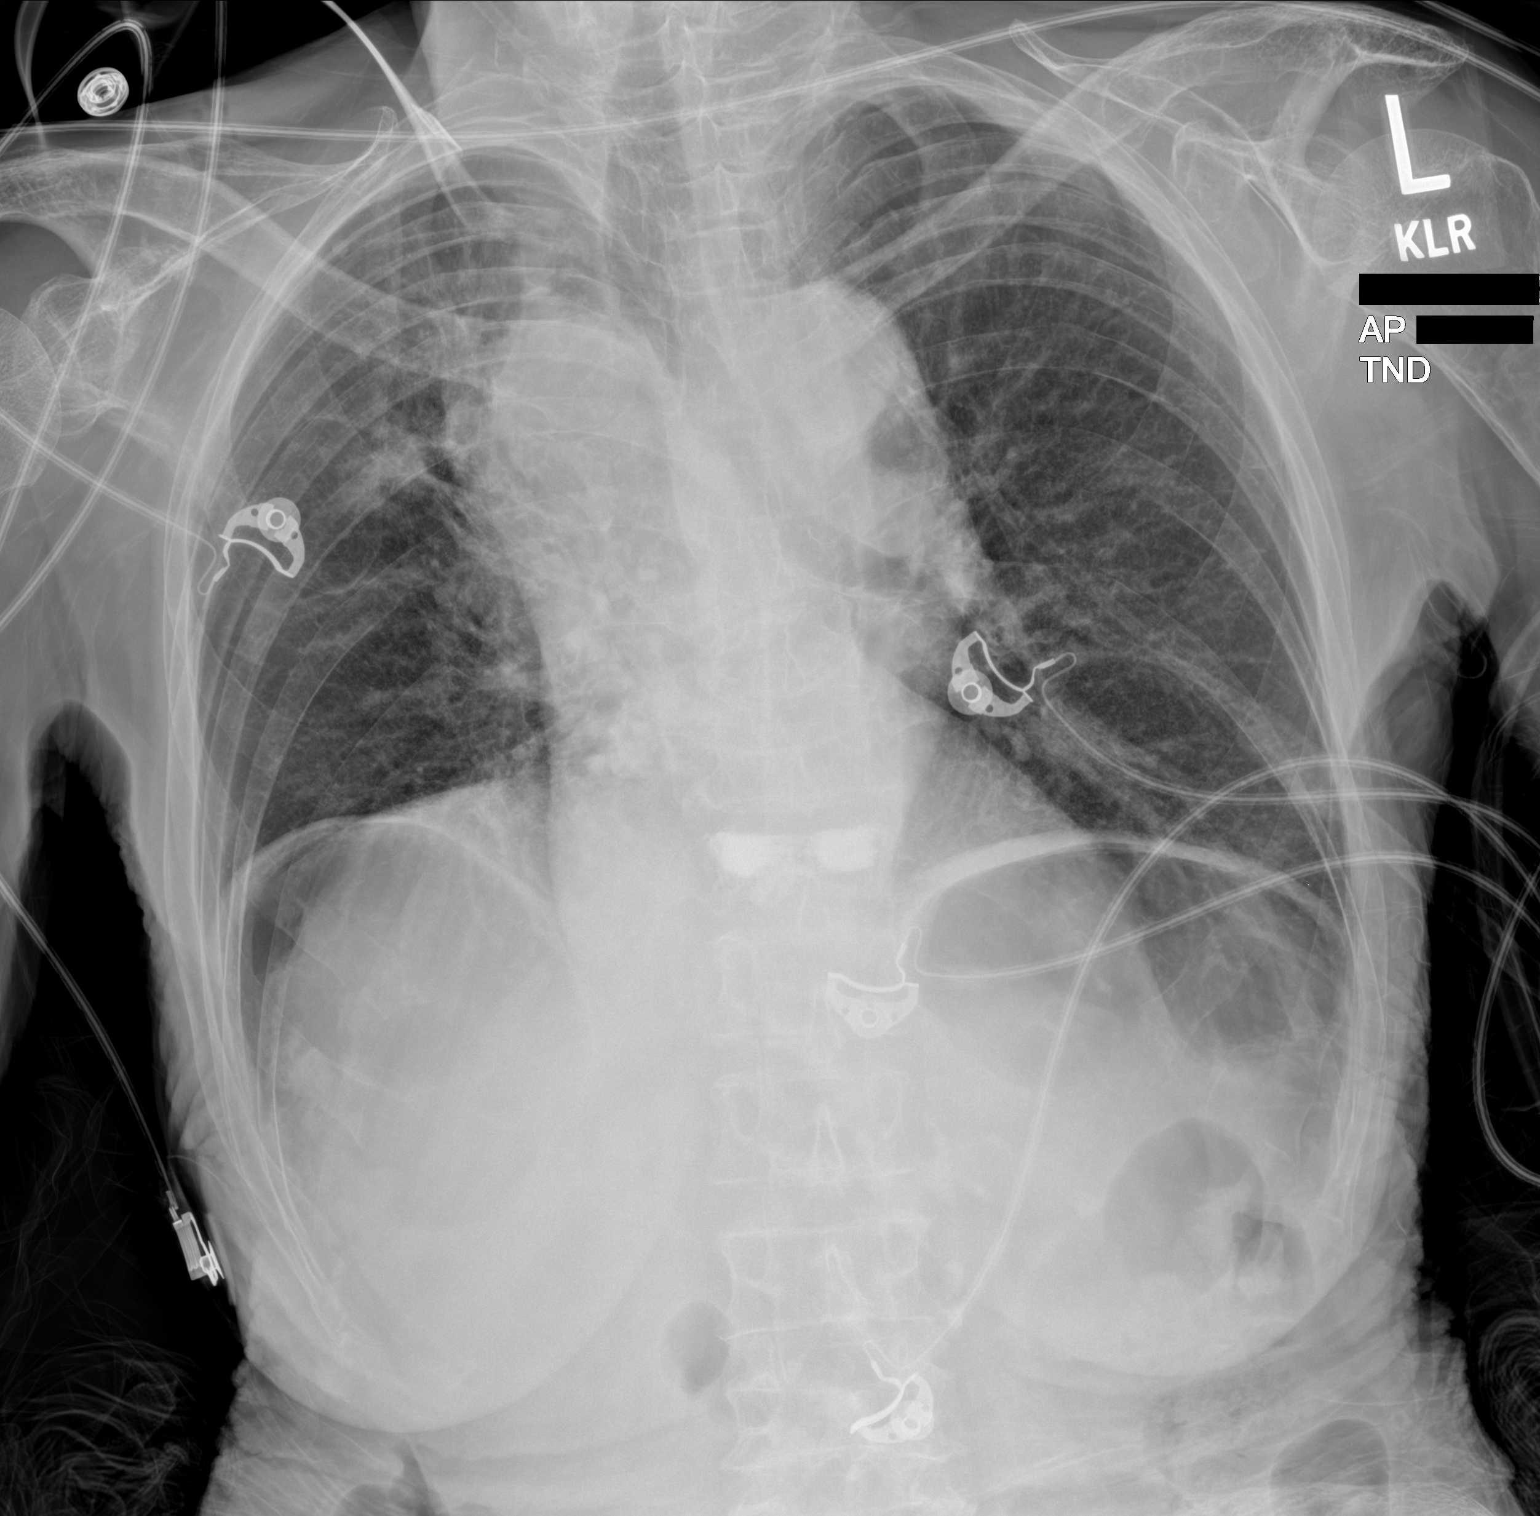

[1 of 1 positions shown; findings below may reference images not displayed]

FINDINGS: Right suprahilar mass with associated adjacent
atelectasis/infiltrate again noted. Reference is made to CT report
of [DATE]. No pleural effusion. No evidence of pneumothorax post
bronchoscopy. Heart size stable. Interposition of colon under the
right hemidiaphragm. This appears more prominent than on prior CT.
To completely exclude free intraperitoneal air abdominal series
suggested. Moderate gastric distention. This could also be further
evaluated with abdominal series. Prior lower thoracic
vertebroplasty. Multiple thoracic spine compression fractures again
noted.
IMPRESSION: 1. Right suprahilar mass with associated adjacent
atelectasis/infiltrate again noted. Reference is made to prior CT
report [DATE]. No evidence of pneumothorax post bronchoscopy.

2. Interposition of the colon under the right hemidiaphragm again
noted. This is appears more prominent than on prior CT. To exclude
free intraperitoneal air abdominal series suggested. Moderate
gastric distention. This can also be further evaluated with
abdominal series.

## 2019-11-05 SURGERY — BRONCHOSCOPY, WITH EBUS
Anesthesia: General

## 2019-11-05 MED ORDER — FENTANYL CITRATE (PF) 100 MCG/2ML IJ SOLN: 25.0000 ug | INTRAMUSCULAR | Status: DC | PRN

## 2019-11-05 MED ORDER — SODIUM CHLORIDE (PF) 0.9 % IJ SOLN
INTRAMUSCULAR | Status: AC
  Filled 2019-11-05: qty 10

## 2019-11-05 MED ORDER — PHENYLEPHRINE HCL (PRESSORS) 10 MG/ML IV SOLN
INTRAVENOUS | Status: DC | PRN
  Administered 2019-11-05 (×2): 100 ug via INTRAVENOUS

## 2019-11-05 MED ORDER — LIDOCAINE HCL (PF) 1 % IJ SOLN
30.0000 mL | Freq: Once | INTRAMUSCULAR | Status: DC
  Filled 2019-11-05: qty 30

## 2019-11-05 MED ORDER — AMOXICILLIN-POT CLAVULANATE 875-125 MG PO TABS
1.0000 | ORAL_TABLET | Freq: Two times a day (BID) | ORAL | Status: DC
  Filled 2019-11-05: qty 1

## 2019-11-05 MED ORDER — ORAL CARE MOUTH RINSE: 15.0000 mL | Freq: Once | OROMUCOSAL | Status: AC

## 2019-11-05 MED ORDER — ROCURONIUM BROMIDE 10 MG/ML (PF) SYRINGE
PREFILLED_SYRINGE | INTRAVENOUS | Status: AC
  Filled 2019-11-05: qty 10

## 2019-11-05 MED ORDER — ONDANSETRON HCL 4 MG/2ML IJ SOLN: 4.0000 mg | Freq: Once | INTRAMUSCULAR | Status: DC | PRN

## 2019-11-05 MED ORDER — ONDANSETRON HCL 4 MG/2ML IJ SOLN
INTRAMUSCULAR | Status: DC | PRN
  Administered 2019-11-05: 4 mg via INTRAVENOUS

## 2019-11-05 MED ORDER — CHLORHEXIDINE GLUCONATE 0.12 % MT SOLN
15.0000 mL | Freq: Once | OROMUCOSAL | Status: AC
  Administered 2019-11-05: 15 mL via OROMUCOSAL

## 2019-11-05 MED ORDER — KETAMINE HCL 10 MG/ML IJ SOLN
INTRAMUSCULAR | Status: DC | PRN
Start: 2019-11-05 — End: 2019-11-05
  Administered 2019-11-05: 30 mg via INTRAVENOUS

## 2019-11-05 MED ORDER — BUTAMBEN-TETRACAINE-BENZOCAINE 2-2-14 % EX AERO
1.0000 | INHALATION_SPRAY | Freq: Once | CUTANEOUS | Status: DC
  Filled 2019-11-05: qty 20

## 2019-11-05 MED ORDER — FENTANYL CITRATE (PF) 100 MCG/2ML IJ SOLN
INTRAMUSCULAR | Status: DC | PRN
  Administered 2019-11-05: 50 ug via INTRAVENOUS
  Administered 2019-11-05 (×2): 25 ug via INTRAVENOUS

## 2019-11-05 MED ORDER — SUGAMMADEX SODIUM 200 MG/2ML IV SOLN
INTRAVENOUS | Status: DC | PRN
  Administered 2019-11-05: 200 mg via INTRAVENOUS

## 2019-11-05 MED ORDER — LIDOCAINE HCL URETHRAL/MUCOSAL 2 % EX GEL
1.0000 "application " | Freq: Once | CUTANEOUS | Status: DC
  Filled 2019-11-05: qty 5

## 2019-11-05 MED ORDER — ONDANSETRON HCL 4 MG/2ML IJ SOLN
INTRAMUSCULAR | Status: AC
  Filled 2019-11-05: qty 2

## 2019-11-05 MED ORDER — PROPOFOL 10 MG/ML IV BOLUS
INTRAVENOUS | Status: DC | PRN
  Administered 2019-11-05: 70 mg via INTRAVENOUS

## 2019-11-05 MED ORDER — SUCCINYLCHOLINE CHLORIDE 20 MG/ML IJ SOLN
INTRAMUSCULAR | Status: DC | PRN
  Administered 2019-11-05: 60 mg via INTRAVENOUS

## 2019-11-05 MED ORDER — DEXMEDETOMIDINE HCL IN NACL 80 MCG/20ML IV SOLN
INTRAVENOUS | Status: AC
  Filled 2019-11-05: qty 20

## 2019-11-05 MED ORDER — CHLORHEXIDINE GLUCONATE 0.12 % MT SOLN
OROMUCOSAL | Status: AC
  Filled 2019-11-05: qty 15

## 2019-11-05 MED ORDER — DEXAMETHASONE SODIUM PHOSPHATE 10 MG/ML IJ SOLN
INTRAMUSCULAR | Status: AC
  Filled 2019-11-05: qty 1

## 2019-11-05 MED ORDER — GLYCOPYRROLATE 0.2 MG/ML IJ SOLN
INTRAMUSCULAR | Status: DC | PRN
  Administered 2019-11-05 (×2): .1 mg via INTRAVENOUS

## 2019-11-05 MED ORDER — LACTATED RINGERS IV SOLN: INTRAVENOUS | Status: DC

## 2019-11-05 MED ORDER — DEXMEDETOMIDINE HCL 200 MCG/2ML IV SOLN
INTRAVENOUS | Status: DC | PRN
  Administered 2019-11-05: 8 ug via INTRAVENOUS
  Administered 2019-11-05: 12 ug via INTRAVENOUS

## 2019-11-05 MED ORDER — GLYCOPYRROLATE 0.2 MG/ML IJ SOLN
INTRAMUSCULAR | Status: AC
  Filled 2019-11-05: qty 1

## 2019-11-05 MED ORDER — PHENYLEPHRINE HCL (PRESSORS) 10 MG/ML IV SOLN
INTRAVENOUS | Status: AC
  Filled 2019-11-05: qty 1

## 2019-11-05 MED ORDER — ALBUTEROL SULFATE HFA 108 (90 BASE) MCG/ACT IN AERS
INHALATION_SPRAY | RESPIRATORY_TRACT | Status: DC | PRN
  Administered 2019-11-05: 2 via RESPIRATORY_TRACT

## 2019-11-05 MED ORDER — LIDOCAINE HCL (CARDIAC) PF 100 MG/5ML IV SOSY
PREFILLED_SYRINGE | INTRAVENOUS | Status: DC | PRN
  Administered 2019-11-05: 40 mg via INTRAVENOUS

## 2019-11-05 MED ORDER — SUCCINYLCHOLINE CHLORIDE 200 MG/10ML IV SOSY
PREFILLED_SYRINGE | INTRAVENOUS | Status: AC
  Filled 2019-11-05: qty 10

## 2019-11-05 MED ORDER — PROPOFOL 10 MG/ML IV BOLUS
INTRAVENOUS | Status: AC
  Filled 2019-11-05: qty 20

## 2019-11-05 MED ORDER — KETAMINE HCL 50 MG/ML IJ SOLN
INTRAMUSCULAR | Status: AC
  Filled 2019-11-05: qty 10

## 2019-11-05 MED ORDER — PHENYLEPHRINE HCL 0.25 % NA SOLN
1.0000 | Freq: Four times a day (QID) | NASAL | Status: DC | PRN
  Filled 2019-11-05: qty 15

## 2019-11-05 MED ORDER — ALBUTEROL SULFATE HFA 108 (90 BASE) MCG/ACT IN AERS
INHALATION_SPRAY | RESPIRATORY_TRACT | Status: AC
  Filled 2019-11-05: qty 6.7

## 2019-11-05 MED ORDER — ROCURONIUM BROMIDE 100 MG/10ML IV SOLN
INTRAVENOUS | Status: DC | PRN
  Administered 2019-11-05: 30 mg via INTRAVENOUS
  Administered 2019-11-05: 10 mg via INTRAVENOUS

## 2019-11-05 MED ORDER — FENTANYL CITRATE (PF) 100 MCG/2ML IJ SOLN
INTRAMUSCULAR | Status: AC
  Filled 2019-11-05: qty 2

## 2019-11-05 MED ORDER — DEXAMETHASONE SODIUM PHOSPHATE 10 MG/ML IJ SOLN
INTRAMUSCULAR | Status: DC | PRN
  Administered 2019-11-05: 5 mg via INTRAVENOUS

## 2019-11-05 NOTE — H&P (Signed)
Pulmonary Medicine          Date: 11/05/2019,   MRN# [id number] Anne Hardy 02/09/1951     AdmissionWeight: (!) 40.8 kg                 CurrentWeight: (!) 40.8 kg      CHIEF COMPLAINT:   Right lung mass with hilar adenopathy   HISTORY OF PRESENT ILLNESS   This is a pleasant 69 yo F with PMH as below. She has hx of lifelong smoking. She has hx of COPD. Patient was seen by oncology and referred for tissue diagnosis of right lung mass. We discussed procedure in detail and patient is agreeable.   Complications include: bleeding, cough, infection, pneumothorax, pneumomediastinum, possible need for chest tube and intubation with mechanical ventilation and rarely death.  Patient relates understanding above and all additional questions have been answered. Patient understands and wishes to proceed, all questions were answered.   Patien  PAST MEDICAL HISTORY   Past Medical History:  Diagnosis Date  . Arthritis   . Cervical disc disease   . COPD (chronic obstructive pulmonary disease) ([place])   . Dyspnea   . GERD (gastroesophageal reflux disease)   . Headache    severe headaches  . Stroke [facility]) 06/2018   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-no residual effedcts-this happened in [place]-Pt just moved to [place] 2 weeks ago (October 2020)     SURGICAL HISTORY   Past Surgical History:  Procedure Laterality Date  . COLONOSCOPY WITH ESOPHAGOGASTRODUODENOSCOPY (EGD)    . femur fx repair    . FRACTURE SURGERY  2018   femur fx  . KYPHOPLASTY N/A 01/28/2019   Procedure: T12 KYPHOPLASTY;  Surgeon: Hessie Knows, MD;  Location: [facility] ORS;  Service: Orthopedics;  Laterality: N/A;  . OOPHORECTOMY Left      FAMILY HISTORY   History reviewed. No pertinent family history.   SOCIAL HISTORY   Social History   Tobacco Use  . Smoking status: Current Every Day Smoker    Years: 50.00    Types: Cigarettes  . Smokeless tobacco: Never Used  . Tobacco comment:  6-8 cig per day  Vaping Use  . Vaping Use: Never used  Substance Use Topics  . Alcohol use: Yes    Comment:  1 drink daily  . Drug use: Never     MEDICATIONS    Home Medication:    Current Medication:  Current Facility-Administered Medications:  .  chlorhexidine (PERIDEX) 0.12 % solution, , , ,  .  lactated ringers infusion, , Intravenous, Continuous, [place], Amy, MD    ALLERGIES   Lactose intolerance (gi)     REVIEW OF SYSTEMS    Review of Systems:  Gen:  Denies  fever, sweats, chills weigh loss  HEENT: Denies blurred vision, double vision, ear pain, eye pain, hearing loss, nose bleeds, sore throat Cardiac:  No dizziness, chest pain or heaviness, chest tightness,edema Resp:   Denies cough or sputum porduction, shortness of breath,wheezing, hemoptysis,  Gi: Denies swallowing difficulty, stomach pain, nausea or vomiting, diarrhea, constipation, bowel incontinence Gu:  Denies bladder incontinence, burning urine Ext:   Denies Joint pain, stiffness or swelling Skin: Denies  skin rash, easy bruising or bleeding or hives Endoc:  Denies polyuria, polydipsia , polyphagia or weight change Psych:   Denies depression, insomnia or hallucinations   Other:  All other systems negative   VS: BP 102/81   Pulse (!) 120   Temp 98.6  F (37 C) (Tympanic)   Resp (!) 26   Ht 5\' 3"  (1.6 m)   Wt (!) 40.8 kg   SpO2 (!) 88%   BMI 15.93 kg/m      PHYSICAL EXAM    GENERAL:NAD, no fevers, chills, no weakness no fatigue HEAD: Normocephalic, atraumatic.  EYES: Pupils equal, round, reactive to light. Extraocular muscles intact. No scleral icterus.  MOUTH: Moist mucosal membrane. Dentition intact. No abscess noted.  EAR, NOSE, THROAT: Clear without exudates. No external lesions.  NECK: Supple. No thyromegaly. No nodules. No JVD.  PULMONARY: bilateral ronchi CARDIOVASCULAR: S1 and S2. Regular rate and rhythm. No murmurs, rubs, or gallops. No edema. Pedal pulses 2+ bilaterally.   GASTROINTESTINAL: Soft, nontender, nondistended. No masses. Positive bowel sounds. No hepatosplenomegaly.  MUSCULOSKELETAL: No swelling, clubbing, or edema. Range of motion full in all extremities.  NEUROLOGIC: Cranial nerves II through XII are intact. No gross focal neurological deficits. Sensation intact. Reflexes intact.  SKIN: No ulceration, lesions, rashes, or cyanosis. Skin warm and dry. Turgor intact.  PSYCHIATRIC: Mood, affect within normal limits. The patient is awake, alert and oriented x 3. Insight, judgment intact.       IMAGING    CT CHEST WO CONTRAST  Result Date: 11/02/2019 CLINICAL DATA:  Pulmonary mass, mediastinal adenopathy, pre biopsy planning EXAM: CT CHEST WITHOUT CONTRAST TECHNIQUE: Multidetector CT imaging of the chest was performed following the standard protocol without IV contrast. COMPARISON:  CT 10/07/2019, PET-CT 10/27/2019 FINDINGS: Cardiovascular: Mild coronary artery calcification. Global cardiac size within normal limits. No pericardial effusion. Mild atherosclerotic calcification within the thoracic aorta without aneurysm. Mediastinum/Nodes: Confluency right paratracheal and right hilar adenopathy with the suprahilar mass is again identified. Distinct measurement of the right hilar mass in paratracheal adenopathy is no longer possible as these appears subtly confluency measuring 7.7 x 4.5 cm at axial image # 16/2. The confluency adenopathy abuts the ascending aorta and compresses the superior vena cava which is difficult to differentiate on this noncontrast examination. There is increasing soft tissue obliteration of the right mainstem bronchus as well as complete opacification of the right upper lobe pulmonary bronchus likely related to invasive disease with endobronchial extension. There is airway impaction within the segmental bronchi of the right lower lobe and right middle lobe which may represent mucous plugging given its relatively rapid development since  prior examination. Pathologic aortopulmonary lymphadenopathy is again identified with the lymph node measuring 13 mm in short axis diameter at axial image # 18. Lungs/Pleura: Progressive postobstructive pneumonitis noted within the right upper lobe. There is increasing right-sided volume loss and right basilar atelectasis. Left lung is clear. Trace right pleural effusion has developed. No pneumothorax. Upper Abdomen: Unremarkable Musculoskeletal: Diffuse body wall wasting. T12 vertebroplasty has been performed. No lytic or blastic bone lesions are seen. Bland appearing T10 and T11 compression fractures with moderate to severe loss of height are stable since prior examination. IMPRESSION: Progressive confluence of right hilar and right paratracheal adenopathy with the suprahilar mass, now inseparable. Increasing soft tissue within the right mainstem bronchus, now nearly completely obliterated, likely representing endobronchial extension of disease. Increasing mucoid impaction within the right middle and lower lobe with increasing right-sided volume loss and postobstructive pneumonitis, particularly within the right upper lobe. Electronically Signed   By: Fidela Salisbury MD   On: 11/02/2019 23:09   CT CHEST W CONTRAST  Result Date: 10/08/2019 CLINICAL DATA:  Lung mass, cough, congestion EXAM: CT CHEST WITH CONTRAST TECHNIQUE: Multidetector CT imaging of the chest was  performed during intravenous contrast administration. CONTRAST:  87mL OMNIPAQUE IOHEXOL 300 MG/ML  SOLN COMPARISON:  MR thoracic spine, 01/21/2019 FINDINGS: Cardiovascular: No significant vascular findings. Normal heart size. Trace pericardial effusion. Mediastinum/Nodes: There is bulky, matted pretracheal, right hilar, and subcarinal lymphadenopathy, largest pretracheal node or conglomerate measuring at least 4.1 x 3.3 cm (series 2, image 37). Thyroid gland, trachea, and esophagus demonstrate no significant findings. Lungs/Pleura: Rounded suprahilar  mass of the posterior right upper lobe measures 4.0 x 3.9 cm (series 2, image 39). There is associated soft tissue and or mucous obstruction/impaction of the right mainstem bronchus (series 2, image 45) and the right upper lobe bronchi (series 2, image 51), with postobstructive nodularity distally. Mild centrilobular emphysema. Trace bilateral pleural effusions. Upper Abdomen: No acute abnormality. Musculoskeletal: Osteopenia. Wedge deformities of T10, T11, and T12 with kyphoplasty of T12. IMPRESSION: 1. Rounded suprahilar mass of the posterior right upper lobe measures 4.0 x 3.9 cm. There is associated soft tissue and or mucous obstruction/impaction of the right mainstem bronchus and the right upper lobe bronchi. 2. Bulky pretracheal, right hilar, and subcarinal lymphadenopathy. 3. Findings are consistent with primary lung malignancy and nodal metastatic disease. 4. Emphysema (ICD10-J43.9). 5. Trace bilateral pleural effusions and trace pericardial effusion. These results will be called to the ordering clinician or representative by the Radiologist Assistant, and communication documented in the PACS or [employer]. Electronically Signed   By: Eddie Candle M.D.   On: 10/08/2019 21:16   NM PET Image Initial (PI) Skull Base To Thigh  Result Date: 10/27/2019 CLINICAL DATA:  Initial treatment strategy for right lung mass, suspected bronchogenic carcinoma. EXAM: NUCLEAR MEDICINE PET SKULL BASE TO THIGH TECHNIQUE: 5.55 mCi F-18 FDG was injected intravenously. Full-ring PET imaging was performed from the skull base to thigh after the radiotracer. CT data was obtained and used for attenuation correction and anatomic localization. Fasting blood glucose: 102 mg/dl COMPARISON:  Chest CT 10/07/2019 FINDINGS: Mediastinal blood pool activity: SUV max 2.4 NECK: No hypermetabolic cervical lymph nodes are identified.There are no lesions of the pharyngeal mucosal space. Incidental CT findings: none CHEST: The previously  demonstrated confluent mediastinal and right hilar adenopathy is hypermetabolic with central necrosis. A right paratracheal component measuring up to 4.4 x 3.9 cm on image 76/3 has an SUV max of 11.8. Contiguous subcarinal component has an SUV max of 13.3. There is a separate high right paratracheal node measuring 6 mm on image 60/3 which has an SUV max of 4.3. There are small hypermetabolic nodes within the AP window, but no contralateral hilar adenopathy. Contiguous right upper lobe mass measuring 4.2 x 3.6 cm on image 72/3 also demonstrates central necrosis and peripheral hypermetabolic activity. No separate suspicious pulmonary nodules are identified. Incidental CT findings: Underlying centrilobular emphysema with postobstructive pneumonitis in the right upper and lower lobes. The right mainstem, right upper and middle lobe bronchi are occluded. There is atherosclerosis of the aorta, great vessels and coronary arteries. ABDOMEN/PELVIS: There is ill-defined mildly increased metabolic activity peripherally in the inferior aspect of the right hepatic lobe. This is near the costal margin and has an SUV max of 4.2. There is no clear corresponding lesion on the CT images. The significance of this finding is uncertain, although metastatic disease is not strongly suspected. There is no hypermetabolic activity within the adrenal glands, spleen or pancreas. There is no hypermetabolic nodal activity. Incidental CT findings: Aortic and branch vessel atherosclerosis. Possible pelvic floor laxity. SKELETON: There is no hypermetabolic activity to suggest osseous metastatic  disease. Grossly stable compression fractures at T10, T11 and T12 post spinal augmentation at T12. Low level metabolic activity at H88 appears treatment related. Incidental CT findings: Previous right proximal femoral ORIF. IMPRESSION: 1. The previously demonstrated right upper lobe lung mass and the confluent right hilar and mediastinal adenopathy are  hypermetabolic consistent with bronchogenic carcinoma. Central necrosis suggests squamous cell carcinoma. 2. No definite distant metastases. Peripheral metabolic activity in the inferior aspect of the right hepatic lobe is relatively linear in orientation and without clear corresponding finding on the CT images. 3. Stable compression deformities at T10, T11 and T12. Electronically Signed   By: Richardean Sale M.D.   On: 10/27/2019 13:26      ASSESSMENT/PLAN   Right lung mass with hilar adenopathy  plan for bronchoscopy with biopsy   Complications include: bleeding, cough, infection, pneumothorax, pneumomediastinum, possible need for chest tube and intubation with mechanical ventilation and rarely death.  Patient relates understanding above and all additional questions have been answered.   Patient is agreeable plan for ENB/EBUS     Thank you for allowing me to participate in the care of this patient.   Patient/Family are satisfied with care plan and all questions have been answered.  This document was prepared using Dragon voice recognition software and may include unintentional dictation errors.     Ottie Glazier, M.D.  Division of [place]

## 2019-11-05 NOTE — Procedures (Signed)
ELECTROMAGNETIC NAVIGATIONAL BRONCHOSCOPY PROCEDURE NOTE  FIBEROPTIC BRONCHOSCOPY WITH BRONCHOALVEOLAR LAVAGE PROCEDURE NOTE  ENDOBRONCHIAL ULTRASOUND PROCEDURE NOTE    Flexible bronchoscopy was performed  by : Lanney Gins MD  assistance by : 1)Repiratory therapist  and 2)[facility] staff and 3) Anesthesia team and 4) Flouroscopy team and 5) [employer] supporting staff   Indication for the procedure was :  Pre-procedural H&P. The following assessment was performed on the day of the procedure prior to initiating sedation History:  Chest pain n Dyspnea y Hemoptysis n Cough y Fever n Other pertinent items n  Examination Vital signs -reviewed as per nursing documentation today Cardiac    Murmurs: n  Rubs : n  Gallop: n Lungs Wheezing: n Rales : n Rhonchi :y  Other pertinent findings: SOB/hypoxemia due to chronic lung disease   Pre-procedural assessment for Procedural Sedation included: Depth of sedation: As per anesthesia team  ASA Classification:  2 Mallampati airway assessment: 3    Medication list reviewed: y  The patient's interval history was taken and revealed: no new complaints The pre- procedure physical examination revealed: No new findings Refer to prior clinic note for details.  Informed Consent: Informed consent was obtained from:  patient after explanation of procedure and risks, benefits, as well as alternative procedures available.  Explanation of level of sedation and possible transfusion was also provided.    Procedural Preparation: Time out was performed and patient was identified by name and birthdate and procedure to be performed and side for sampling, if any, was specified. Pt was intubated by anesthesia.  The patient was appropriately draped.   Fiberoptic bronchoscopy with airway inspection and BAL Procedure findings:  Bronchoscope was inserted via ETT  without difficulty.  Posterior oropharynx, epiglottis, arytenoids, false cords and  vocal cords were not visualized as these were bypassed by endotracheal tube. The distal trachea was normal in circumference and appearance without mucosal, cartilaginous or branching abnormalities.  The main carina was mildly splayed . All right and left lobar airways were visualized to the Subsegmental level.  Sub- sub segmental carinae were identified in all the distal airways.   Secretions were visible in the following airways and appeared to be clear.  The mucosa was : friable at right mainstem bronchus  Airways were notable for:        exophytic lesions : right mainstem       extrinsic compression in the following distributions: n.       Friable mucosa: y       Neurosurgeon /pigmentation: n     Post procedure Diagnosis:   Lung cancer     Electromagnetic Navigational Bronchoscopy Procedure Findings:  After appropriate CT-guided planning ENB scope was advanced via endotracheal tube and LG was advanced for registration.  Post appropriate planning and registration peripheral navigation was used to visualize target lesion.    After appropriate planning and registration target lesion was found with initial Cytobrush x2-[facility] staff reporting lesional cancer cells.  After this target lesion was biopsied with surgical forceps x4-[facility] staff reporting atypical lesional cancer cells  Post procedure diagnosis: Lung cancer      Endobronchial ultrasound assisted hilar and mediastinal lymph node biopsies procedure findings: The fiberoptic bronchoscope was removed and the EBUS scope was introduced. Examination began to evaluate for pathologically enlarged lymph nodes starting on the left side progressing to the right side.  All lymph node biopsies performed with 21 needle. Lymph node biopsies were sent in cytolite for all stations.  Starting on the  left side initially lymph node station 4L was measured to be 1.2 cm this was biopsied 4 times with atypical cells reported by  [facility] staff.  Next station 10 L was visualized to be under ultrasound approximately 2.7 cm this was biopsied 4 times with atypical cells and good lymphoid shedding reported by [facility] staff.  Next station 7 was evaluated and was found to be a approximately 2.4 cm under ultrasound measurement with 3 good specimens obtained with lymphoid shedding and atypical cells found.   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   Total dosage of Lidocaine was zero mg Total fluoroscopy time was .02  minutes   Estimated Blood loss: 1cc.  Complications included:  none immediate  Preliminary CXR findings :  None   Disposition: home   Follow up with Dr. AlLanney Ginsn 5 days for result discussion.     FuOttie GlazierD  [facility]

## 2019-11-05 NOTE — Anesthesia Procedure Notes (Addendum)
Procedure Name: Intubation Date/Time: 11/05/2019 7:50 AM Performed by: Lia Foyer, CRNA Pre-anesthesia Checklist: Patient identified, Emergency Drugs available, Suction available and Patient being monitored Patient Re-evaluated:Patient Re-evaluated prior to induction Oxygen Delivery Method: Circle system utilized Preoxygenation: Pre-oxygenation with 100% oxygen Induction Type: IV induction Ventilation: Mask ventilation without difficulty Laryngoscope Size: McGraph and 3 Grade View: Grade I Tube type: Oral Tube size: 8.5 mm Number of attempts: 1 Airway Equipment and Method: Stylet,  Oral airway and Video-laryngoscopy Placement Confirmation: ETT inserted through vocal cords under direct vision,  positive ETCO2 and breath sounds checked- equal and bilateral Secured at: 20 cm Tube secured with: Tape Dental Injury: Teeth and Oropharynx as per pre-operative assessment

## 2019-11-05 NOTE — OR Nursing (Signed)
Pt. exhibits difficulty breathing with an elevated respiratory rate. Dr. Marica Otter warden called and assessed pt. At [facility]. Pt. Also complained of abdominal pain. Pt. O2 sat went from 88% to 96%. O2 decreased to standard 2 LPM.

## 2019-11-05 NOTE — Transfer of Care (Signed)
Immediate Anesthesia Transfer of Care Note  Patient: Anne Hardy  Procedure(s) Performed: VIDEO BRONCHOSCOPY WITH ENDOBRONCHIAL ULTRASOUND (N/A ) VIDEO BRONCHOSCOPY WITH ENDOBRONCHIAL NAVIGATION (N/A )  Patient Location: PACU  Anesthesia Type:General  Level of Consciousness: drowsy  Airway & Oxygen Therapy: Patient Spontanous Breathing and Patient connected to face mask oxygen  Post-op Assessment: Report given to RN and Post -op Vital signs reviewed and stable  Post vital signs: Reviewed and stable  Last Vitals:  Vitals Value Taken Time  BP 112/77 11/05/19 0921  Temp    Pulse 95 11/05/19 0924  Resp 19 11/05/19 0924  SpO2 93 % 11/05/19 0924  Vitals shown include unvalidated device data.  Last Pain:  Vitals:   11/05/19 0630  TempSrc: Tympanic  PainSc: 8          Complications: No complications documented.

## 2019-11-05 NOTE — Anesthesia Postprocedure Evaluation (Signed)
Anesthesia Post Note  Patient: [name]  Procedure(s) Performed: VIDEO BRONCHOSCOPY WITH ENDOBRONCHIAL ULTRASOUND (N/A ) VIDEO BRONCHOSCOPY WITH ENDOBRONCHIAL NAVIGATION (N/A )  Patient location during evaluation: PACU Anesthesia Type: General Level of consciousness: awake and alert and oriented Pain management: pain level controlled Vital Signs Assessment: post-procedure vital signs reviewed and stable Respiratory status: spontaneous breathing, nonlabored ventilation and respiratory function stable Cardiovascular status: blood pressure returned to baseline and stable Postop Assessment: no signs of nausea or vomiting Anesthetic complications: no   No complications documented.   Last Vitals:  Vitals:   11/05/19 0951 11/05/19 0959  BP: 96/65 103/71  Pulse: 93 91  Resp: (!) 24 (!) 24  Temp: 36.6 C 36.4 C  SpO2: 100% 92%    Last Pain:  Vitals:   11/05/19 0959  TempSrc: Temporal  PainSc: 0-No pain                 Danyale Ridinger

## 2019-11-05 NOTE — Discharge Instructions (Signed)
PER DR. ALESKEROV -  PLEASE PICK UP YOUR ANTIBIOTIC AT YOUR PHARMACY , IT HAS BEEN SENT IN PRIOR TO SURGERY. YOU MAY CONTINUE USING HOME OXYGEN EXPECT COUGHING AND INCREASE FLUIDS  REGULAR DIET TONIGHT IF TAKING ORAL FLUIDS FINE.    AMBULATORY SURGERY  DISCHARGE INSTRUCTIONS   1) The drugs that you were given will stay in your system until tomorrow so for the next 24 hours you should not:  A) Drive an automobile B) Make any legal decisions C) Drink any alcoholic beverage   2) You may resume regular meals tomorrow.  Today it is better to start with liquids and gradually work up to solid foods.  You may eat anything you prefer, but it is better to start with liquids, then soup and crackers, and gradually work up to solid foods.   3) Please notify your doctor immediately if you have any unusual bleeding, trouble breathing, redness and pain at the surgery site, drainage, fever, or pain not relieved by medication.    4) Additional Instructions:        Please contact your physician with any problems or Same Day Surgery at [phone], Monday through Friday 6 am to 4 pm, or  at [facility] number at [phone].

## 2019-11-05 NOTE — Anesthesia Preprocedure Evaluation (Addendum)
Anesthesia Evaluation  Patient identified by MRN, date of birth, ID band Patient awake    Reviewed: Allergy & Precautions, NPO status , Patient's Chart, lab work & pertinent test results  History of Anesthesia Complications Negative for: history of anesthetic complications  Airway Mallampati: II  TM Distance: >3 FB Neck ROM: Full    Dental  (+) Poor Dentition, Missing, Chipped   Pulmonary shortness of breath, COPD,  oxygen dependent, Current Smoker and Patient abstained from smoking.,    breath sounds clear to auscultation- rhonchi (-) wheezing      Cardiovascular Exercise Tolerance: Poor (-) hypertension(-) CAD, (-) Past MI, (-) Cardiac Stents and (-) CABG  Rhythm:Regular Rate:Normal - Systolic murmurs and - Diastolic murmurs    Neuro/Psych  Headaches, CVA, No Residual Symptoms negative psych ROS   GI/Hepatic Neg liver ROS, GERD  ,  Endo/Other  negative endocrine ROSneg diabetes  Renal/GU Renal InsufficiencyRenal disease     Musculoskeletal  (+) Arthritis ,   Abdominal (+) - obese,   Peds  Hematology  (+) anemia ,   Anesthesia Other Findings Past Medical History: No date: Arthritis No date: Cervical disc disease No date: COPD (chronic obstructive pulmonary disease) (HCC) No date: Dyspnea No date: GERD (gastroesophageal reflux disease) No date: Headache     Comment:  severe headaches 06/2018: Stroke [facility])     Comment:  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-no               residual effedcts-this happened in [place]-Pt just moved to              [place] 2 weeks ago (October 2020)   Reproductive/Obstetrics                             Anesthesia Physical Anesthesia Plan  ASA: IV  Anesthesia Plan: General   Post-op Pain Management:    Induction: Intravenous  PONV Risk Score and Plan: 1 and Ondansetron and Dexamethasone  Airway Management Planned: Oral ETT  Additional  Equipment:   Intra-op Plan:   Post-operative Plan: Extubation in OR and Possible Post-op intubation/ventilation  Informed Consent: I have reviewed the patients History and Physical, chart, labs and discussed the procedure including the risks, benefits and alternatives for the proposed anesthesia with the patient or authorized representative who has indicated his/her understanding and acceptance.     Dental advisory given  Plan Discussed with: CRNA and Anesthesiologist  Anesthesia Plan Comments:        Anesthesia Quick Evaluation

## 2019-11-07 ENCOUNTER — Encounter: Payer: Self-pay | Admitting: Pulmonary Disease

## 2019-11-08 DIAGNOSIS — E538 Deficiency of other specified B group vitamins: Secondary | ICD-10-CM | POA: Insufficient documentation

## 2019-11-09 ENCOUNTER — Other Ambulatory Visit: Payer: Self-pay

## 2019-11-09 ENCOUNTER — Emergency Department: Payer: Medicare Other

## 2019-11-09 ENCOUNTER — Inpatient Hospital Stay
Admission: EM | Admit: 2019-11-09 | Discharge: 2019-11-12 | DRG: 871 | Disposition: A | Discharge status: Home Health | Payer: Medicare Other | Attending: Internal Medicine | Admitting: Internal Medicine

## 2019-11-09 ENCOUNTER — Ambulatory Visit: Admission: RE | Admit: 2019-11-09 | Payer: Medicare Other | Source: Ambulatory Visit

## 2019-11-09 ENCOUNTER — Inpatient Hospital Stay: Payer: Medicare Other

## 2019-11-09 ENCOUNTER — Encounter: Payer: Self-pay | Admitting: Emergency Medicine

## 2019-11-09 DIAGNOSIS — Z515 Encounter for palliative care: Secondary | ICD-10-CM

## 2019-11-09 DIAGNOSIS — Z20822 Contact with and (suspected) exposure to covid-19: Secondary | ICD-10-CM | POA: Diagnosis present

## 2019-11-09 DIAGNOSIS — J439 Emphysema, unspecified: Secondary | ICD-10-CM | POA: Diagnosis present

## 2019-11-09 DIAGNOSIS — D72829 Elevated white blood cell count, unspecified: Secondary | ICD-10-CM

## 2019-11-09 DIAGNOSIS — C779 Secondary and unspecified malignant neoplasm of lymph node, unspecified: Secondary | ICD-10-CM | POA: Diagnosis present

## 2019-11-09 DIAGNOSIS — E871 Hypo-osmolality and hyponatremia: Secondary | ICD-10-CM | POA: Diagnosis present

## 2019-11-09 DIAGNOSIS — J9611 Chronic respiratory failure with hypoxia: Secondary | ICD-10-CM | POA: Diagnosis present

## 2019-11-09 DIAGNOSIS — T360X5A Adverse effect of penicillins, initial encounter: Secondary | ICD-10-CM | POA: Diagnosis not present

## 2019-11-09 DIAGNOSIS — E876 Hypokalemia: Secondary | ICD-10-CM | POA: Diagnosis present

## 2019-11-09 DIAGNOSIS — J690 Pneumonitis due to inhalation of food and vomit: Secondary | ICD-10-CM | POA: Diagnosis present

## 2019-11-09 DIAGNOSIS — Z7983 Long term (current) use of bisphosphonates: Secondary | ICD-10-CM

## 2019-11-09 DIAGNOSIS — R21 Rash and other nonspecific skin eruption: Secondary | ICD-10-CM | POA: Diagnosis not present

## 2019-11-09 DIAGNOSIS — J189 Pneumonia, unspecified organism: Secondary | ICD-10-CM | POA: Diagnosis not present

## 2019-11-09 DIAGNOSIS — R531 Weakness: Secondary | ICD-10-CM

## 2019-11-09 DIAGNOSIS — E43 Unspecified severe protein-calorie malnutrition: Secondary | ICD-10-CM

## 2019-11-09 DIAGNOSIS — R64 Cachexia: Secondary | ICD-10-CM | POA: Diagnosis present

## 2019-11-09 DIAGNOSIS — Z880 Allergy status to penicillin: Secondary | ICD-10-CM | POA: Diagnosis not present

## 2019-11-09 DIAGNOSIS — F17210 Nicotine dependence, cigarettes, uncomplicated: Secondary | ICD-10-CM | POA: Diagnosis present

## 2019-11-09 DIAGNOSIS — A419 Sepsis, unspecified organism: Secondary | ICD-10-CM | POA: Diagnosis present

## 2019-11-09 DIAGNOSIS — C3401 Malignant neoplasm of right main bronchus: Secondary | ICD-10-CM | POA: Diagnosis present

## 2019-11-09 DIAGNOSIS — Z8701 Personal history of pneumonia (recurrent): Secondary | ICD-10-CM | POA: Diagnosis not present

## 2019-11-09 DIAGNOSIS — M1990 Unspecified osteoarthritis, unspecified site: Secondary | ICD-10-CM | POA: Diagnosis present

## 2019-11-09 DIAGNOSIS — K529 Noninfective gastroenteritis and colitis, unspecified: Secondary | ICD-10-CM | POA: Diagnosis present

## 2019-11-09 DIAGNOSIS — Z79899 Other long term (current) drug therapy: Secondary | ICD-10-CM

## 2019-11-09 DIAGNOSIS — Z66 Do not resuscitate: Secondary | ICD-10-CM | POA: Diagnosis present

## 2019-11-09 DIAGNOSIS — Z681 Body mass index (BMI) 19 or less, adult: Secondary | ICD-10-CM | POA: Diagnosis not present

## 2019-11-09 DIAGNOSIS — Z7951 Long term (current) use of inhaled steroids: Secondary | ICD-10-CM

## 2019-11-09 DIAGNOSIS — Z8673 Personal history of transient ischemic attack (TIA), and cerebral infarction without residual deficits: Secondary | ICD-10-CM

## 2019-11-09 DIAGNOSIS — K219 Gastro-esophageal reflux disease without esophagitis: Secondary | ICD-10-CM | POA: Diagnosis present

## 2019-11-09 DIAGNOSIS — C3411 Malignant neoplasm of upper lobe, right bronchus or lung: Secondary | ICD-10-CM | POA: Diagnosis present

## 2019-11-09 DIAGNOSIS — Z9981 Dependence on supplemental oxygen: Secondary | ICD-10-CM | POA: Diagnosis not present

## 2019-11-09 DIAGNOSIS — C3491 Malignant neoplasm of unspecified part of right bronchus or lung: Secondary | ICD-10-CM

## 2019-11-09 LAB — URINALYSIS, COMPLETE (UACMP) WITH MICROSCOPIC
Bacteria, UA: NONE SEEN
Bilirubin Urine: NEGATIVE
Glucose, UA: NEGATIVE mg/dL
Ketones, ur: NEGATIVE mg/dL
Leukocytes,Ua: NEGATIVE
Nitrite: NEGATIVE
Protein, ur: NEGATIVE mg/dL
Specific Gravity, Urine: 1.024 (ref 1.005–1.030)
pH: 5 (ref 5.0–8.0)

## 2019-11-09 LAB — COMPREHENSIVE METABOLIC PANEL
ALT: 13 U/L (ref 0–44)
AST: 12 U/L — ABNORMAL LOW (ref 15–41)
Albumin: 3 g/dL — ABNORMAL LOW (ref 3.5–5.0)
Alkaline Phosphatase: 100 U/L (ref 38–126)
Anion gap: 11 (ref 5–15)
BUN: 14 mg/dL (ref 8–23)
CO2: 24 mmol/L (ref 22–32)
Calcium: 8.8 mg/dL — ABNORMAL LOW (ref 8.9–10.3)
Chloride: 96 mmol/L — ABNORMAL LOW (ref 98–111)
Creatinine, Ser: 0.77 mg/dL (ref 0.44–1.00)
GFR calc Af Amer: >60 mL/min (ref >60)
GFR calc non Af Amer: >60 mL/min (ref >60)
Glucose, Bld: 170 mg/dL — ABNORMAL HIGH (ref 70–99)
Potassium: 3.5 mmol/L (ref 3.5–5.1)
Sodium: 131 mmol/L — ABNORMAL LOW (ref 135–145)
Total Bilirubin: 0.9 mg/dL (ref 0.3–1.2)
Total Protein: 6.5 g/dL (ref 6.5–8.1)

## 2019-11-09 LAB — CBC WITH DIFFERENTIAL/PLATELET
Abs Immature Granulocytes: 0.24 10*3/uL — ABNORMAL HIGH (ref 0.00–0.07)
Basophils Absolute: 0.1 10*3/uL (ref 0.0–0.1)
Basophils Relative: 0 %
Eosinophils Absolute: 0 10*3/uL (ref 0.0–0.5)
Eosinophils Relative: 0 %
HCT: 40.4 % (ref 36.0–46.0)
Hemoglobin: 12.7 g/dL (ref 12.0–15.0)
Immature Granulocytes: 1 %
Lymphocytes Relative: 2 %
Lymphs Abs: 0.5 10*3/uL — ABNORMAL LOW (ref 0.7–4.0)
MCH: 27.1 pg (ref 26.0–34.0)
MCHC: 31.4 g/dL (ref 30.0–36.0)
MCV: 86.1 fL (ref 80.0–100.0)
Monocytes Absolute: 0.9 10*3/uL (ref 0.1–1.0)
Monocytes Relative: 3 %
Neutro Abs: 29.4 10*3/uL — ABNORMAL HIGH (ref 1.7–7.7)
Neutrophils Relative %: 94 %
Platelets: 545 10*3/uL — ABNORMAL HIGH (ref 150–400)
RBC: 4.69 MIL/uL (ref 3.87–5.11)
RDW: 15.7 % — ABNORMAL HIGH (ref 11.5–15.5)
WBC: 31.1 10*3/uL — ABNORMAL HIGH (ref 4.0–10.5)
nRBC: 0 % (ref 0.0–0.2)

## 2019-11-09 LAB — TROPONIN I (HIGH SENSITIVITY)
Troponin I (High Sensitivity): 8 ng/L (ref <18)
Troponin I (High Sensitivity): 10 ng/L (ref <18)

## 2019-11-09 LAB — PROCALCITONIN: Procalcitonin: 1.57 ng/mL

## 2019-11-09 LAB — SARS CORONAVIRUS 2 BY RT PCR (HOSPITAL ORDER, PERFORMED IN ~~LOC~~ HOSPITAL LAB): SARS Coronavirus 2: NEGATIVE

## 2019-11-09 LAB — LACTIC ACID, PLASMA: Lactic Acid, Venous: 1.9 mmol/L (ref 0.5–1.9)

## 2019-11-09 IMAGING — DX DG CHEST 1V PORT
1 series · 1 of 1 positions shown · non-contrast
Comparison: [DATE]

CLINICAL DATA: Weakness

EXAM:
PORTABLE CHEST 1 VIEW

[chest ap]
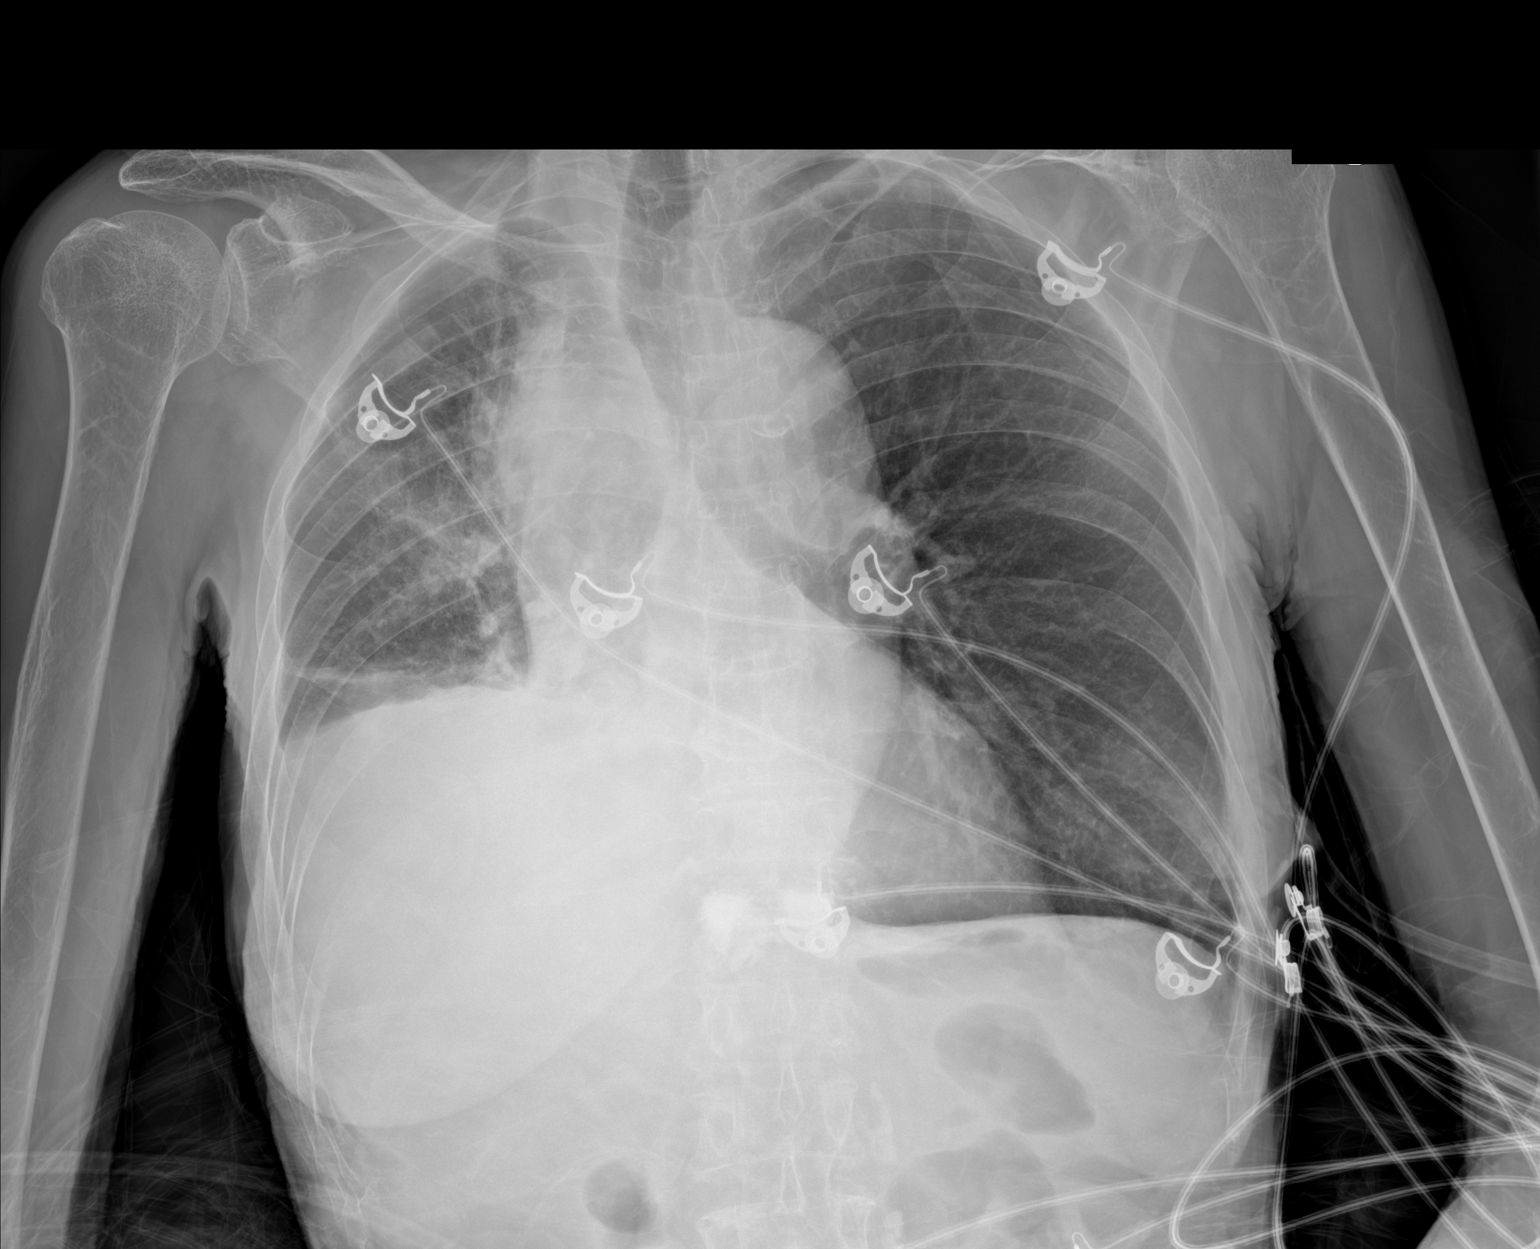

[1 of 1 positions shown; findings below may reference images not displayed]

FINDINGS: The heart size and mediastinal contours are unchanged. There appears
to be a right suprahilar ill-defined mass/airspace opacity as on the
prior exam. There is elevation of the right hemidiaphragm with
subsegmental atelectasis. There is a small right pleural effusion
present. The left lung is clear. No acute osseous abnormality.
IMPRESSION: Right suprahilar mass with surrounding airspace opacities, not
significantly changed since prior exam.

Probable small right pleural effusion.

## 2019-11-09 IMAGING — MR MR HEAD WO/W CM
17 series · 46 of 48 positions shown · IV contrast (4ml Gadavist)
Comparison: PET-CT [DATE], brain MRI [DATE].

CLINICAL DATA: 69-year-old female with recently diagnosed lung
cancer. Staging.

EXAM:
MRI HEAD WITHOUT AND WITH CONTRAST
TECHNIQUE: Multiplanar, multiecho pulse sequences of the brain and surrounding
structures were obtained without and with intravenous contrast.
CONTRAST:  4mL GADAVIST GADOBUTROL 1 MMOL/ML IV SOLN

[Series 5: ax dwi_tracew · axial · 3.0mm · 0.60mm/px · z∈[-124,+28]mm · 4 of 48 slices shown]
[im 1/48]
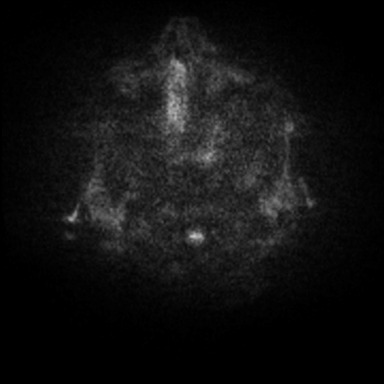
[im 16/48]
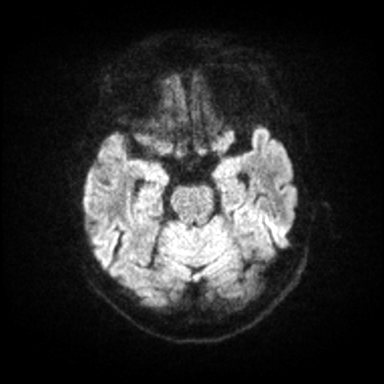
[im 32/48]
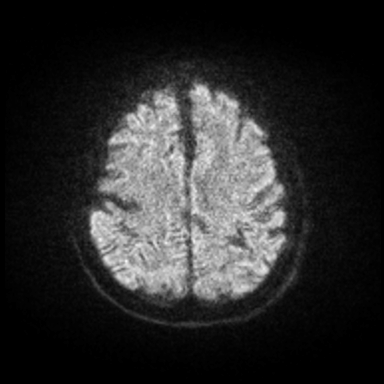
[im 48/48]
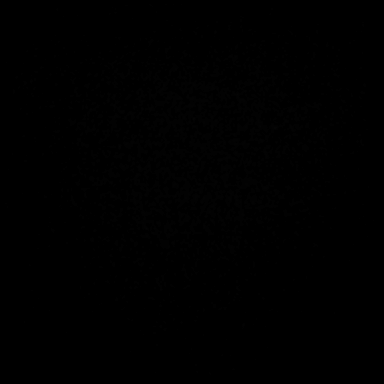

[Series 6: ax dwi_adc · axial · 3.0mm · 0.60mm/px · z∈[-124,+25]mm · 3 of 47 slices shown]
[im 1/47]
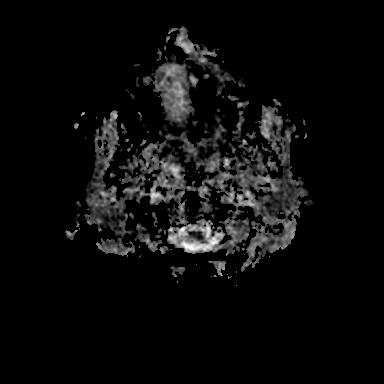
[im 24/47]
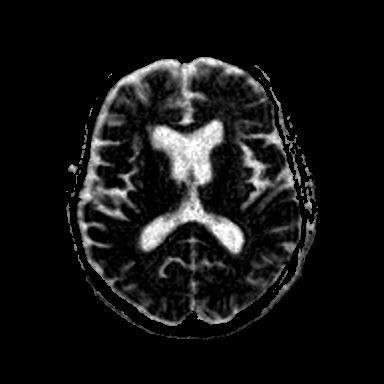
[im 47/47]
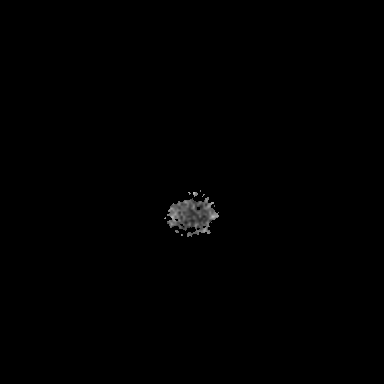

[Series 7: cor dwi_tracew · coronal · 5.0mm · 0.60mm/px · 2 of 40 slices shown]
[im 1/40]
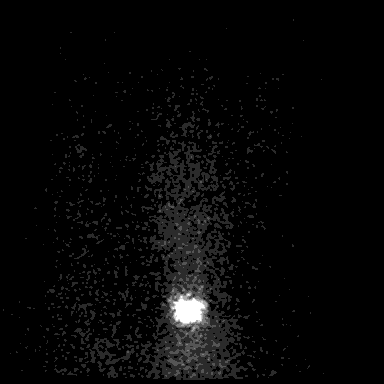
[im 40/40]
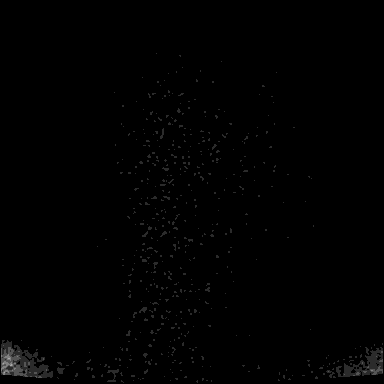

[Series 8: cor dwi_adc · coronal · 5.0mm · 0.60mm/px · 2 of 37 slices shown]
[im 1/37]
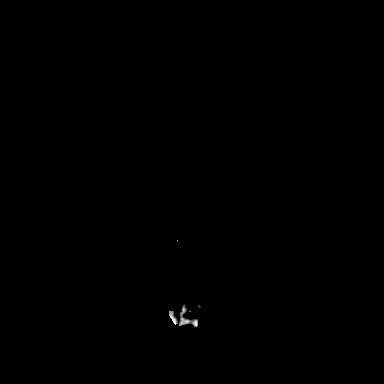
[im 37/37]
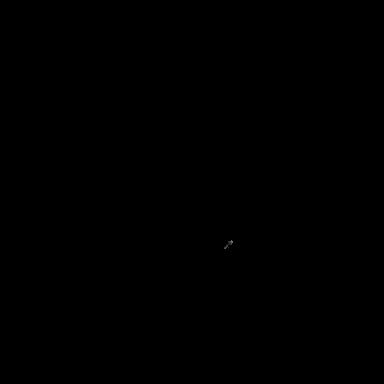

[Series 9: T1 · sagittal · 5.0mm · 0.62mm/px · 1 of 24 slices shown (1 of 4)]
[im 1/24]
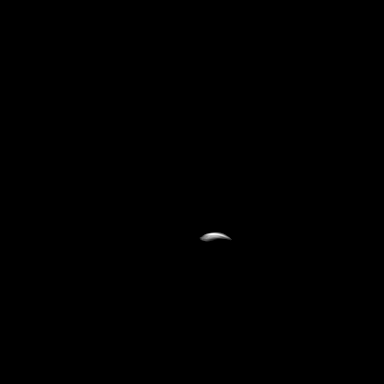

[Series 10: T2 · axial · 5.0mm · 0.53mm/px · 1 of 25 slices shown]
[im 1/25]
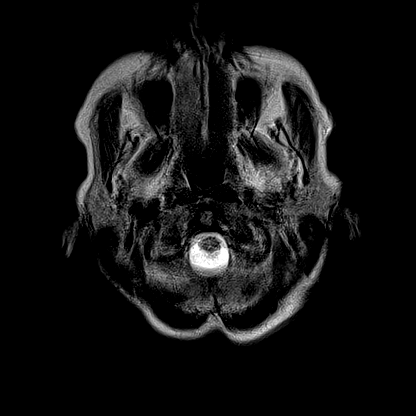

[Series 12: pha_images · axial · 3.0mm · 0.90mm/px · z∈[-134,+37]mm · 3 of 53 slices shown]
[im 1/53]
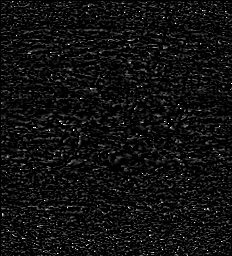
[im 27/53]
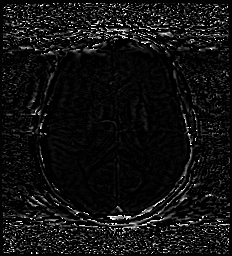
[im 53/53]
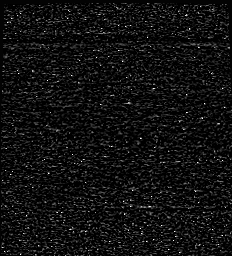

[Series 13: swi_images · axial · 3.0mm · 0.90mm/px · z∈[-134,-49]mm · 2 of 60 slices shown]
[im 1/60]
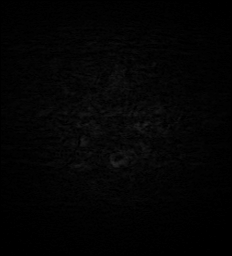
[im 30/60]
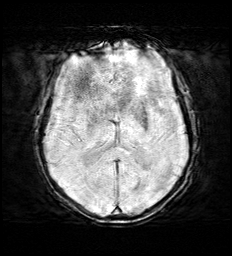

[Series 15: FLAIR · axial · 3.0mm · 0.53mm/px · z∈[-127,+33]mm · 3 of 55 slices shown (1 of 2)]
[im 1/55]
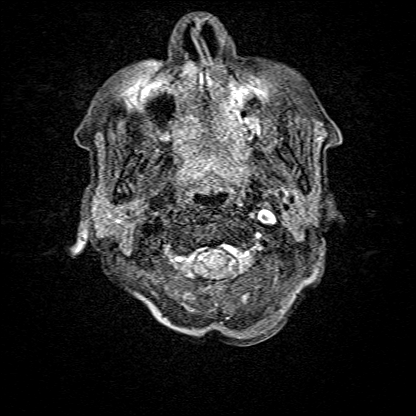
[im 28/55]
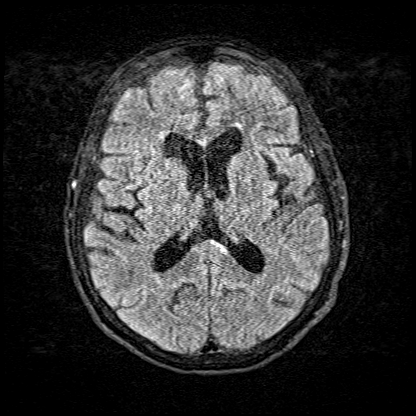
[im 55/55]
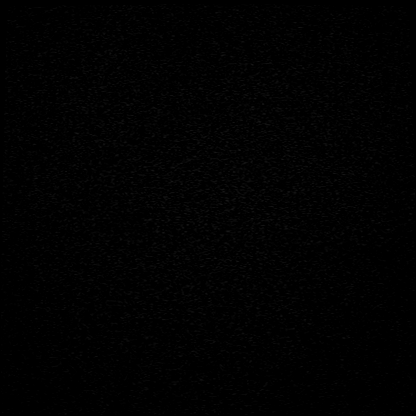

[Series 16: T1 · axial · 1.0mm · 0.98mm/px · z∈[-136,+36]mm · 8 of 176 slices shown (2 of 4)]
[im 1/176]
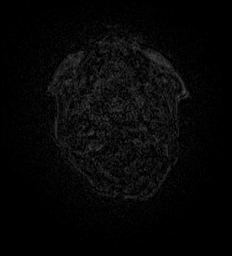
[im 22/176]
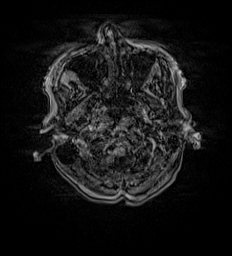
[im 44/176]
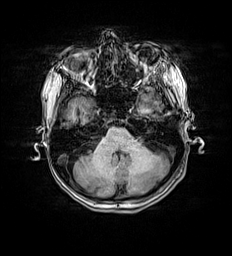
[im 66/176]
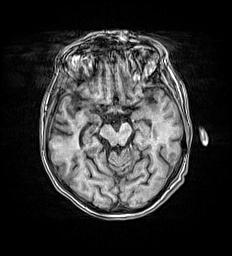
[im 110/176]
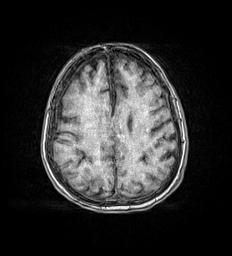
[im 132/176]
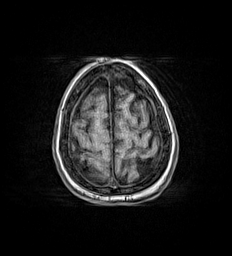
[im 154/176]
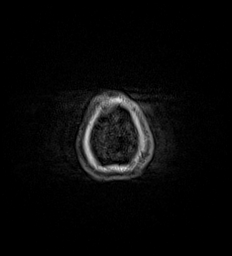
[im 176/176]
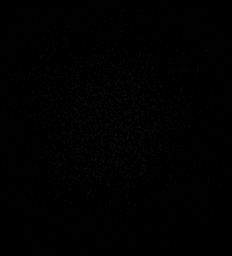

[Series 17: FLAIR · axial · 5.0mm · 1.20mm/px · 1 of 27 slices shown (2 of 2)]
[im 1/27]
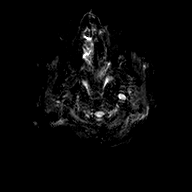

[Series 18: T1 · sagittal · 5.0mm · 0.94mm/px · 1 of 23 slices shown (3 of 4)]
[im 1/23]
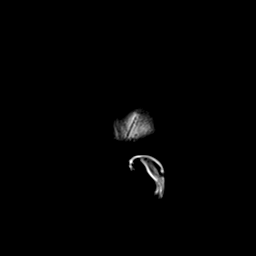

[Series 19: T2 post-contrast · coronal · 5.0mm · 0.57mm/px · 2 of 29 slices shown]
[im 1/29]
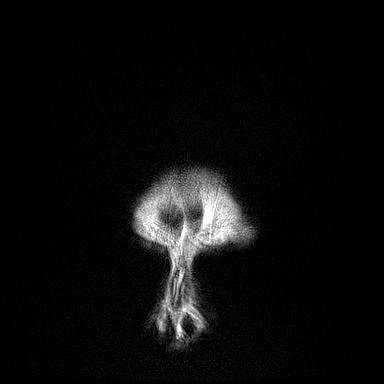
[im 29/29]
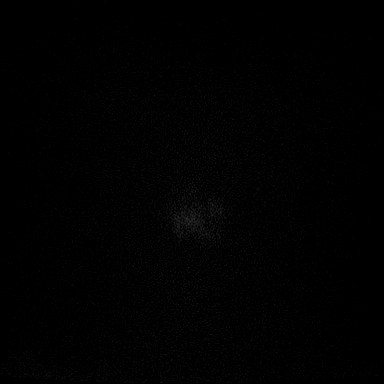

[Series 20: T1 post-contrast · axial · 1.0mm · 0.98mm/px · z∈[-136,+36]mm · 9 of 176 slices shown (1 of 3)]
[im 1/176]
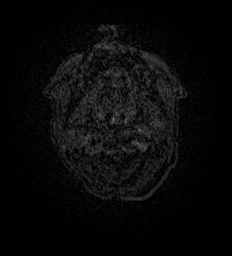
[im 22/176]
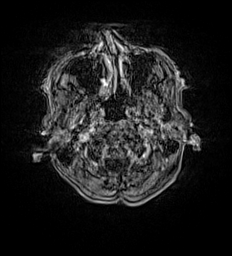
[im 44/176]
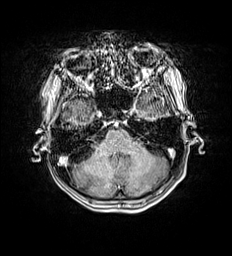
[im 66/176]
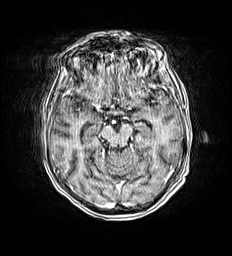
[im 88/176]
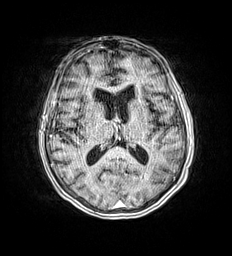
[im 110/176]
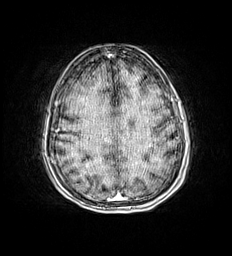
[im 132/176]
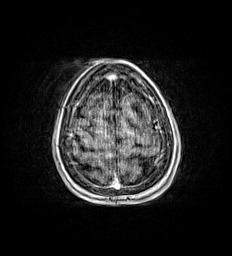
[im 154/176]
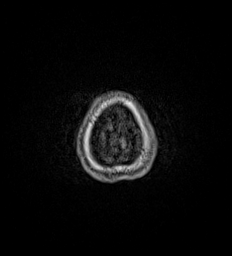
[im 176/176]
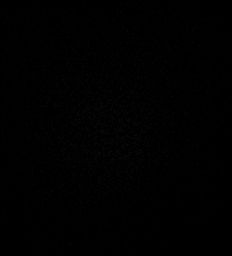

[Series 21: T1 post-contrast · coronal · 5.0mm · 0.57mm/px · 2 of 29 slices shown (2 of 3)]
[im 1/29]
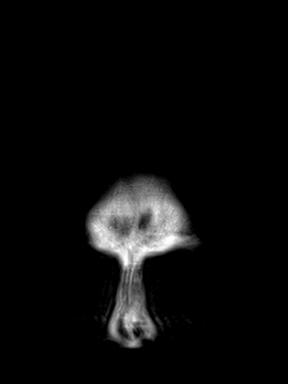
[im 29/29]
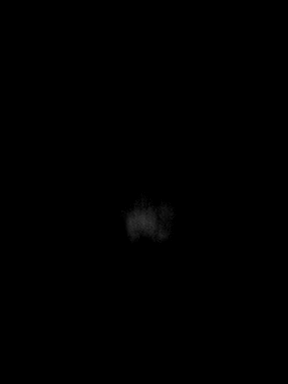

[Series 22: T1 post-contrast · sagittal · 5.0mm · 0.62mm/px · 1 of 25 slices shown (3 of 3)]
[im 1/25]
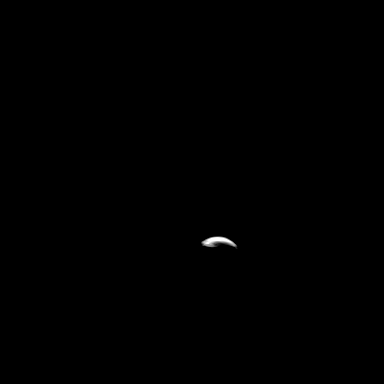

[Series 23: T1 · axial · 5.0mm · 0.90mm/px · 1 of 27 slices shown (4 of 4)]
[im 1/27]
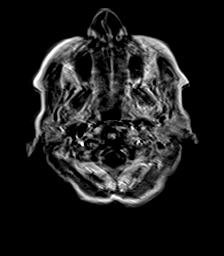

[46 of 48 positions shown; findings below may reference images not displayed]

FINDINGS: Brain: Study is intermittently degraded by motion artifact despite
repeated imaging attempts.

Postcontrast enhancement of the brain appears stable since [REDACTED] and
within normal limits. No abnormal intracranial enhancement or dural
thickening identified. No midline shift, mass effect, or evidence of
intracranial mass lesion.

No restricted diffusion to suggest acute infarction. No
ventriculomegaly, extra-axial collection or acute intracranial
hemorrhage. Cervicomedullary junction and pituitary are within
normal limits.

Gray and white matter signal appears stable since [REDACTED], with a
small area of post ischemic appearing encephalomalacia in the left
centrum semiovale and corona radiata.

There is chronic superficial siderosis over both superior
hemispheres greater on the right, better demonstrated on [REDACTED] SWI
due to motion artifact today. There is also some left occipital lobe
involvement.

Vascular: Major intracranial vascular flow voids are stable. The
major dural venous sinuses are enhancing and appear to be patent.

Skull and upper cervical spine: Stable, negative.

Sinuses/Orbits: Stable, negative.

Other: Mastoids remain clear. Grossly normal visible internal
auditory structures. Scalp and face soft tissues appear negative.
IMPRESSION: 1. Intermittently motion degraded exam today but overall stable MRI
appearance of the brain since [REDACTED]. No metastatic disease or acute
intracranial abnormality identified.

2. Chronic Superficial Siderosis, more pronounced in the right
hemisphere. Chronic small vessel ischemia suspected in the left
hemisphere white matter.

## 2019-11-09 IMAGING — CT CT ABD-PELV W/ CM
2 of 5 series · 15 of 46 positions shown, 17 images · IV contrast (APPLIED)
Comparison: [DATE] PET-CT

CLINICAL DATA: History of lung carcinoma with abdominal pain,
initial encounter

EXAM:
CT ABDOMEN AND PELVIS WITH CONTRAST
TECHNIQUE: Multidetector CT imaging of the abdomen and pelvis was performed
using the standard protocol following bolus administration of
intravenous contrast.
CONTRAST:  75mL OMNIPAQUE IOHEXOL 300 MG/ML  SOLN

[Series 2: routine abd/pel with · axial · 0.61mm/px · z∈[-1161,-811]mm · 12 of 80 slices shown, 14 images]
[im 5/80  soft-tissue]
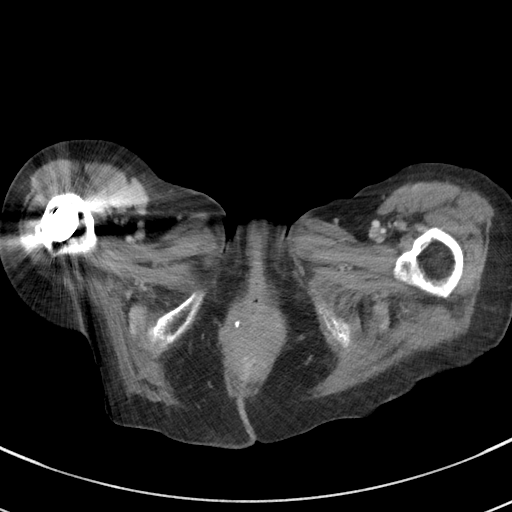
[im 5/80  bone]
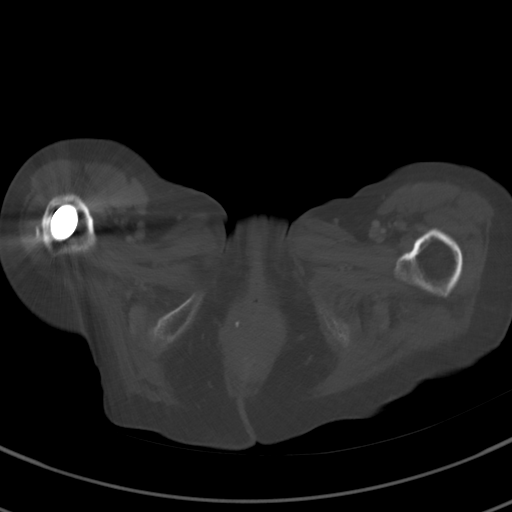
[im 14/80  soft-tissue]
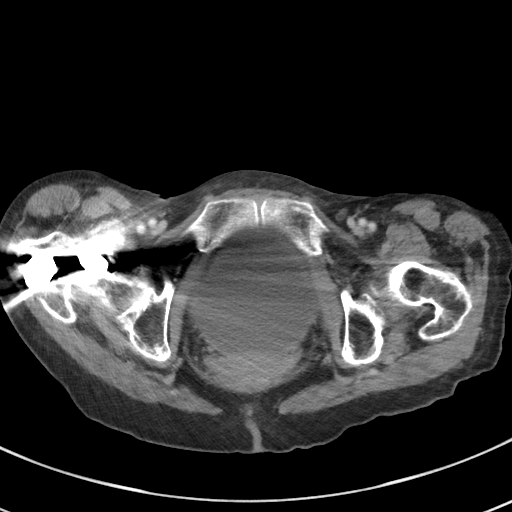
[im 19/80  soft-tissue]
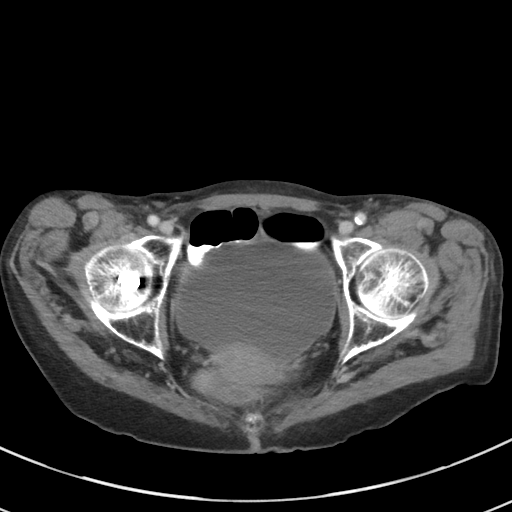
[im 24/80  soft-tissue]
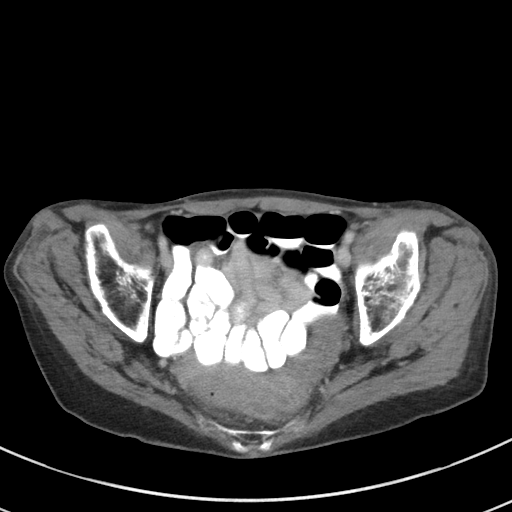
[im 33/80  soft-tissue]
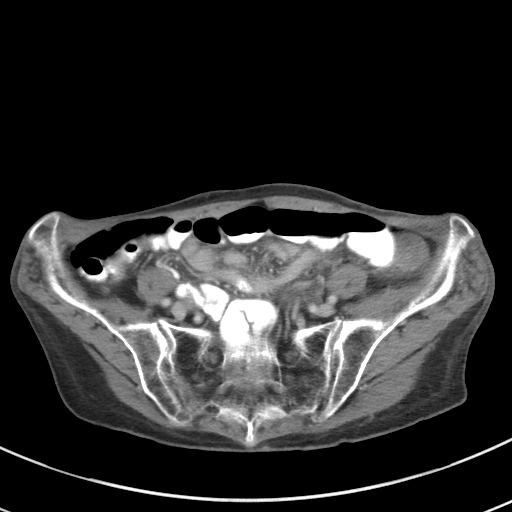
[im 38/80  soft-tissue]
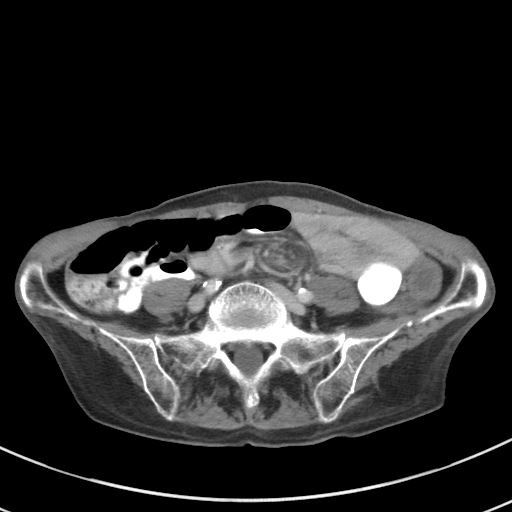
[im 42/80  soft-tissue]
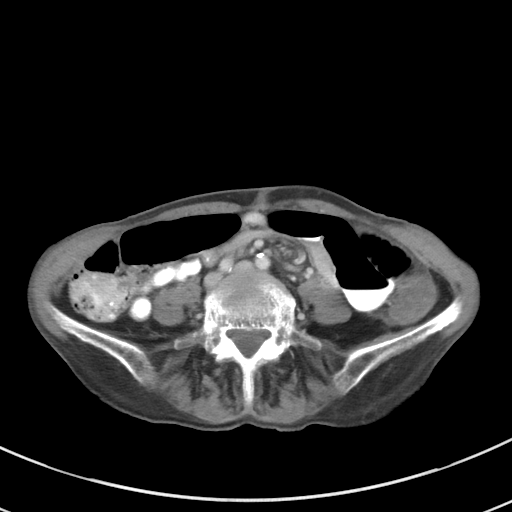
[im 52/80  soft-tissue]
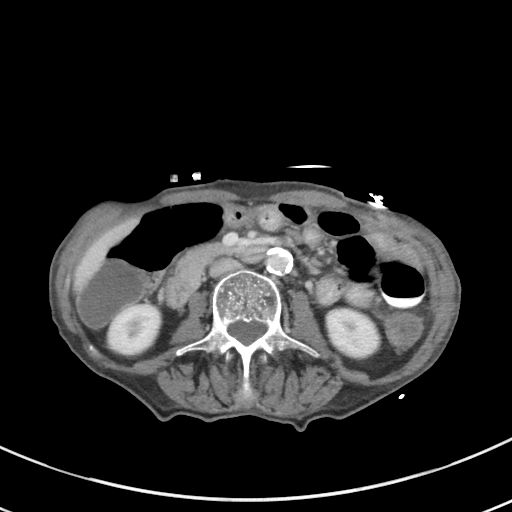
[im 56/80  soft-tissue]
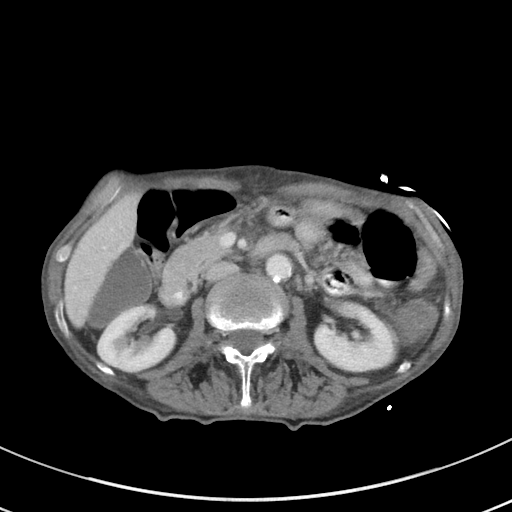
[im 56/80  bone]
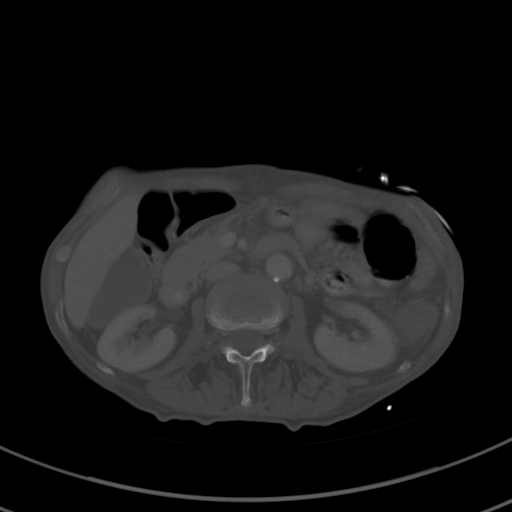
[im 61/80  soft-tissue]
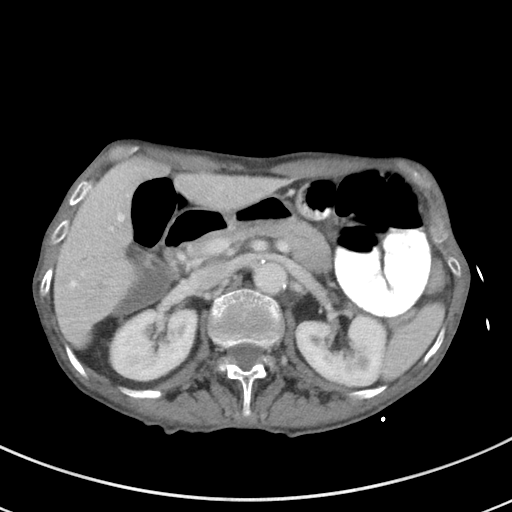
[im 70/80  soft-tissue]
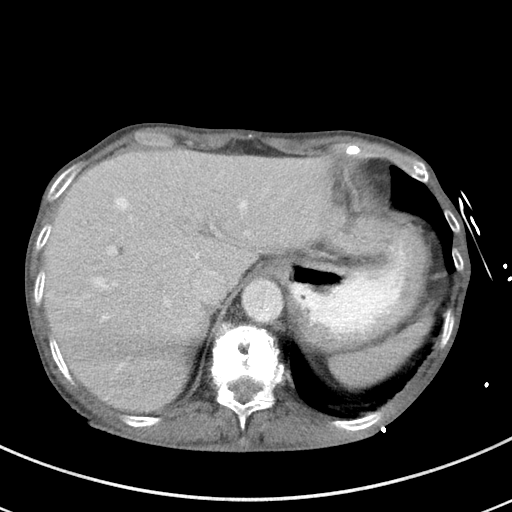
[im 75/80  soft-tissue]
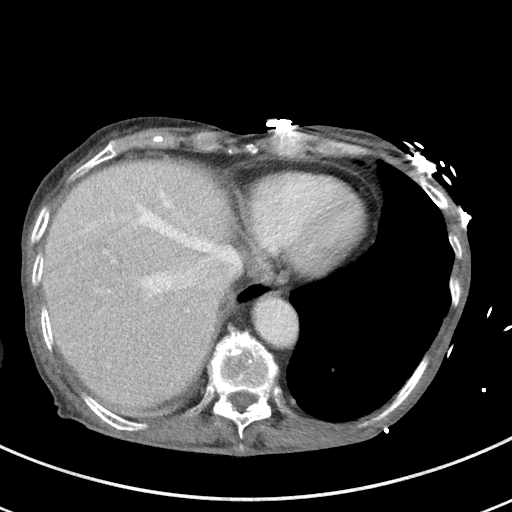

[Series 5: coronal st · coronal · 0.59mm/px · 3 of 71 slices shown]
[im 24/71  soft-tissue]
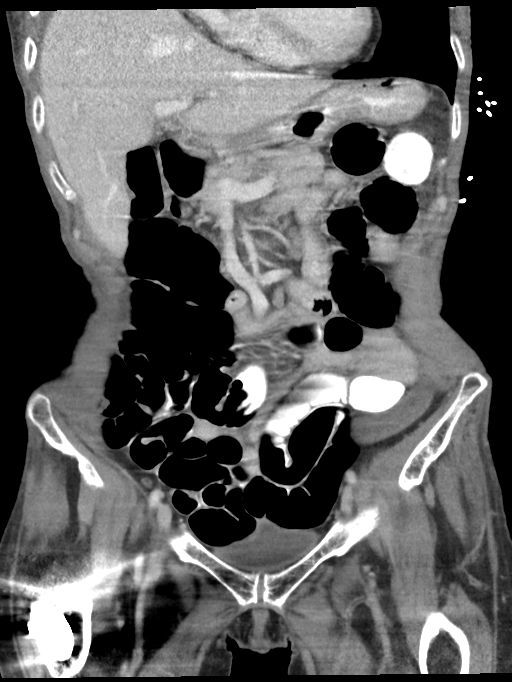
[im 32/71  soft-tissue]
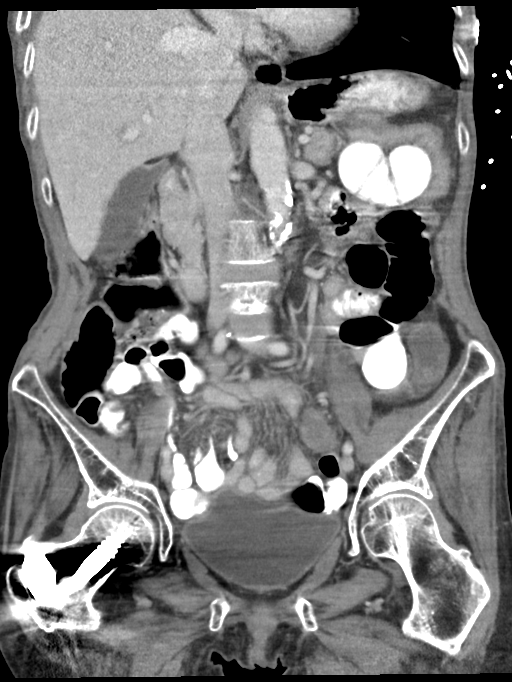
[im 39/71  soft-tissue]
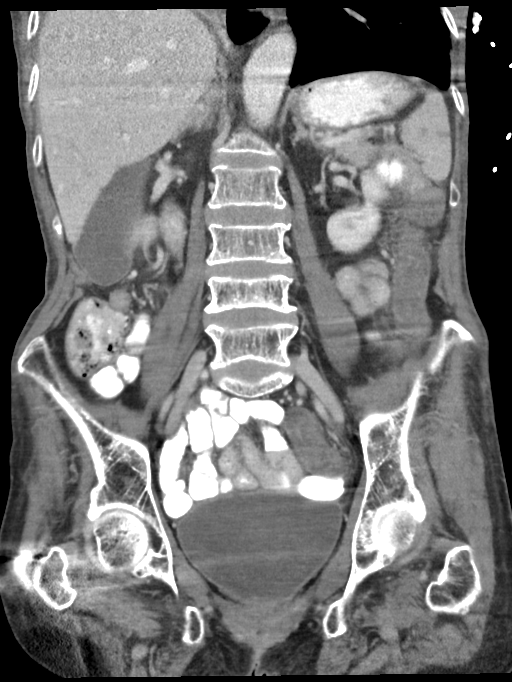

[15 of 46 positions shown; findings below may reference images not displayed]

FINDINGS: Lower chest: Consolidation in the right lower lobe is noted. Small
right pleural effusion is noted. The left lung is clear. On delayed
images there is a focal soft tissue lesion in the right hilum which
corresponds to areas of increased activity on recent PET-CT
consistent with lymphadenopathy.

Hepatobiliary: No focal liver abnormality is seen. No gallstones,
gallbladder wall thickening, or biliary dilatation. No focal mass
lesion is noted to correspond with the area of increased metabolic
activity on recent PET-CT.

Pancreas: Unremarkable. No pancreatic ductal dilatation or
surrounding inflammatory changes.

Spleen: Normal in size without focal abnormality.

Adrenals/Urinary Tract: Adrenal glands are within normal limits.
Kidneys are well visualized bilaterally within normal enhancement
pattern. Delayed images demonstrate normal excretion of contrast
material. The bladder is within normal limits.

Stomach/Bowel: Appendix is not well visualized. The distal colon is
decompressed with mild inflammatory change identified suggestive of
mild colitis. These changes are new from the prior PET-CT. No small
bowel abnormality is noted. The stomach is within normal limits.

Vascular/Lymphatic: Aortic calcifications are seen. No significant
lymphadenopathy is noted.

Reproductive: Small calcified uterine fibroids are seen.

Other: No abdominal wall hernia or abnormality. No abdominopelvic
ascites.

Musculoskeletal: Right hip fixation is noted. No acute bony
abnormality is seen. Changes of prior vertebral augmentation are
noted at T12. T10 compression deformity is noted. Mild L4
compression deformity is noted as well.
IMPRESSION: Right lower lobe consolidation as well as lymphadenopathy in the
right hilar region similar to that seen on prior PET-CT.

Changes of mild colitis in the distal colon.

Chronic compression deformities.

## 2019-11-09 MED ORDER — ONDANSETRON HCL 4 MG/2ML IJ SOLN
4.0000 mg | Freq: Four times a day (QID) | INTRAMUSCULAR | Status: DC | PRN
  Administered 2019-11-09 – 2019-11-10 (×3): 4 mg via INTRAVENOUS
  Filled 2019-11-09 (×3): qty 2

## 2019-11-09 MED ORDER — TRAZODONE HCL 50 MG PO TABS
50.0000 mg | ORAL_TABLET | Freq: Every day | ORAL | Status: DC
  Administered 2019-11-09 – 2019-11-11 (×3): 50 mg via ORAL
  Filled 2019-11-09 (×3): qty 1

## 2019-11-09 MED ORDER — HYDROCODONE-ACETAMINOPHEN 5-325 MG PO TABS
1.0000 | ORAL_TABLET | Freq: Four times a day (QID) | ORAL | Status: DC | PRN
  Administered 2019-11-09 – 2019-11-12 (×9): 1 via ORAL
  Filled 2019-11-09 (×9): qty 1

## 2019-11-09 MED ORDER — GADOBUTROL 1 MMOL/ML IV SOLN
4.0000 mL | Freq: Once | INTRAVENOUS | Status: AC | PRN
  Administered 2019-11-09: 4 mL via INTRAVENOUS

## 2019-11-09 MED ORDER — FAMOTIDINE 20 MG PO TABS
40.0000 mg | ORAL_TABLET | Freq: Every evening | ORAL | Status: DC
  Administered 2019-11-09 – 2019-11-10 (×2): 40 mg via ORAL
  Filled 2019-11-09 (×2): qty 2

## 2019-11-09 MED ORDER — ENOXAPARIN SODIUM 30 MG/0.3ML ~~LOC~~ SOLN
30.0000 mg | SUBCUTANEOUS | Status: DC
  Administered 2019-11-10 – 2019-11-12 (×3): 30 mg via SUBCUTANEOUS
  Filled 2019-11-09 (×4): qty 0.3

## 2019-11-09 MED ORDER — SODIUM CHLORIDE 0.9 % IV BOLUS
1000.0000 mL | Freq: Once | INTRAVENOUS | Status: AC
  Administered 2019-11-09: 1000 mL via INTRAVENOUS

## 2019-11-09 MED ORDER — IOHEXOL 9 MG/ML PO SOLN
500.0000 mL | Freq: Two times a day (BID) | ORAL | Status: DC | PRN
  Administered 2019-11-09: 500 mL via ORAL

## 2019-11-09 MED ORDER — VANCOMYCIN HCL IN DEXTROSE 1-5 GM/200ML-% IV SOLN
1000.0000 mg | Freq: Once | INTRAVENOUS | Status: AC
  Administered 2019-11-09: 1000 mg via INTRAVENOUS
  Filled 2019-11-09: qty 200

## 2019-11-09 MED ORDER — ONDANSETRON HCL 4 MG PO TABS: 4.0000 mg | ORAL_TABLET | Freq: Four times a day (QID) | ORAL | Status: DC | PRN

## 2019-11-09 MED ORDER — SODIUM CHLORIDE 0.9 % IV SOLN
500.0000 mg | INTRAVENOUS | Status: DC
  Administered 2019-11-09 – 2019-11-12 (×4): 500 mg via INTRAVENOUS
  Filled 2019-11-09 (×4): qty 500

## 2019-11-09 MED ORDER — ACETAMINOPHEN 325 MG PO TABS: 650.0000 mg | ORAL_TABLET | Freq: Four times a day (QID) | ORAL | Status: DC | PRN

## 2019-11-09 MED ORDER — METRONIDAZOLE IN NACL 5-0.79 MG/ML-% IV SOLN
500.0000 mg | Freq: Three times a day (TID) | INTRAVENOUS | Status: DC
  Administered 2019-11-09 – 2019-11-10 (×4): 500 mg via INTRAVENOUS
  Filled 2019-11-09 (×5): qty 100

## 2019-11-09 MED ORDER — IOHEXOL 300 MG/ML  SOLN
75.0000 mL | Freq: Once | INTRAMUSCULAR | Status: AC | PRN
  Administered 2019-11-09: 75 mL via INTRAVENOUS

## 2019-11-09 MED ORDER — ONDANSETRON HCL 4 MG/2ML IJ SOLN
INTRAMUSCULAR | Status: AC
  Administered 2019-11-09: 4 mg via INTRAVENOUS
  Filled 2019-11-09: qty 2

## 2019-11-09 MED ORDER — PIPERACILLIN-TAZOBACTAM 3.375 G IVPB 30 MIN
3.3750 g | Freq: Once | INTRAVENOUS | Status: AC
  Administered 2019-11-09: 3.375 g via INTRAVENOUS
  Filled 2019-11-09: qty 50

## 2019-11-09 MED ORDER — IPRATROPIUM-ALBUTEROL 0.5-2.5 (3) MG/3ML IN SOLN
3.0000 mL | Freq: Three times a day (TID) | RESPIRATORY_TRACT | Status: DC
  Administered 2019-11-09 – 2019-11-12 (×4): 3 mL via RESPIRATORY_TRACT
  Filled 2019-11-09 (×8): qty 3

## 2019-11-09 MED ORDER — GABAPENTIN 100 MG PO CAPS
100.0000 mg | ORAL_CAPSULE | Freq: Three times a day (TID) | ORAL | Status: DC
  Administered 2019-11-09 – 2019-11-12 (×10): 100 mg via ORAL
  Filled 2019-11-09 (×12): qty 1

## 2019-11-09 MED ORDER — SODIUM CHLORIDE 0.9 % IV SOLN
2.0000 g | INTRAVENOUS | Status: DC
  Administered 2019-11-09 – 2019-11-10 (×2): 2 g via INTRAVENOUS
  Filled 2019-11-09: qty 2
  Filled 2019-11-09: qty 20

## 2019-11-09 MED ORDER — ONDANSETRON HCL 4 MG/2ML IJ SOLN: 4.0000 mg | Freq: Once | INTRAMUSCULAR | Status: AC

## 2019-11-09 MED ORDER — ACETAMINOPHEN 650 MG RE SUPP: 650.0000 mg | Freq: Four times a day (QID) | RECTAL | Status: DC | PRN

## 2019-11-09 MED ORDER — SODIUM CHLORIDE 0.9 % IV SOLN: INTRAVENOUS | Status: DC

## 2019-11-09 NOTE — Progress Notes (Incomplete)
[facility]  [address], [address] [place], [place] [address] Phone: [phone]  Fax: [phone]   Telemedicine Office Visit:  11/09/2019  Referring physician: Toni Arthurs, NP  I connected with Anne Hardy on 11/10/2019 at 8:39 AM by videoconferencing and verified that I was speaking with the correct person using 2 identifiers.  The patient was at home.  I discussed the limitations, risk, security and privacy concerns of performing an evaluation and management service by videoconferencing and the availability of in person appointments.  I also discussed with the patient that there may be a patient responsible charge related to this service.  The patient expressed understanding and agreed to proceed.  Chief Complaint: Anne Hardy is a 69 y.o. female with a right lung mass and mediastinal adenopathy who is seen today for assessment, review of pathology from bronchoscopy, and review of head MRI.  HPI:   The patient was last seen in the medical oncology clinic on 11/01/2019 via telemedicine. At that time, she felt the same.  She had lost weight.  She had mild headaches. Hematocrit was 37.3, hemoglobin 12.0, platelets 731,000, WBC 20,800 (ANC 17,600). INR was 1.0. aPTT was 35.   Chest CT without contrast on 11/02/2018 revealed progressive confluence of right hilar and right paratracheal adenopathy with the suprahilar mass, now inseparable. There was increasing soft tissue within the right mainstem bronchus, now nearly completely obliterated, likely representing endobronchial extension of disease. There was increasing mucoid impaction within the right middle and lower lobe with increasing right-sided volume loss and postobstructive pneumonitis, particularly within the right upper lobe.  Video bronchoscopy with endobronchial ultrasound on 11/05/2019 by Dr. Lanney Gins revealed exophytic lesion in the right mainstem bronchus with friable mucosa.  Pathology is   Pending.  Head MRI on 11/09/2019 revealed an intermittently motion degraded exam, but overall stable MRI since 06/2019.  There was no metastatic disease or acute intracranial abnormality.  During the interim, ***   Past Medical History:  Diagnosis Date  . Arthritis   . Cervical disc disease   . COPD (chronic obstructive pulmonary disease) ([place])   . Dyspnea   . GERD (gastroesophageal reflux disease)   . Headache    severe headaches  . Stroke [facility]) 06/2018   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-no residual effedcts-this happened in [place]-Pt just moved to [place] 2 weeks ago (October 2020)    Past Surgical History:  Procedure Laterality Date  . COLONOSCOPY WITH ESOPHAGOGASTRODUODENOSCOPY (EGD)    . femur fx repair    . FRACTURE SURGERY  2018   femur fx  . KYPHOPLASTY N/A 01/28/2019   Procedure: T12 KYPHOPLASTY;  Surgeon: Hessie Knows, MD;  Location: [facility] ORS;  Service: Orthopedics;  Laterality: N/A;  . OOPHORECTOMY Left   . VIDEO BRONCHOSCOPY WITH ENDOBRONCHIAL NAVIGATION N/A 11/05/2019   Procedure: VIDEO BRONCHOSCOPY WITH ENDOBRONCHIAL NAVIGATION;  Surgeon: Ottie Glazier, MD;  Location: [facility] ORS;  Service: Thoracic;  Laterality: N/A;  . VIDEO BRONCHOSCOPY WITH ENDOBRONCHIAL ULTRASOUND N/A 11/05/2019   Procedure: VIDEO BRONCHOSCOPY WITH ENDOBRONCHIAL ULTRASOUND;  Surgeon: Ottie Glazier, MD;  Location: [facility] ORS;  Service: Thoracic;  Laterality: N/A;    No family history on file.  Social History:  reports that she has been smoking cigarettes. She has smoked for the past 50.00 years. She has never used smokeless tobacco. She reports current alcohol use. She reports that she does not use drugs. She has smoked for the past 50+ years. She previously smoked 3/4 ppd.  She  currently smokes 5-6 cigarettes/day.  She drinks one drink per night (Kahlua and cream). The patient grew up in [place] and then moved to [place], [place] for 40 years. She moved to [place] in 12/2018 to  live with her sister who also acts as her caregiver. 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 The patient is accompanied by her caregiver/sister, Anne Hardy*** today.  Participants in the patient's visit and their role in the encounter included the patient, Anne Hardy and Vito Berger, CMA, today.  The intake visit was provided by Vito Berger, CMA.  Allergies:  Allergies  Allergen Reactions  . Lactose Intolerance (Gi)     Current Medications: No current facility-administered medications for this visit.   Current Outpatient Medications  Medication Sig Dispense Refill  . acetaminophen (TYLENOL) 500 MG tablet Take 1,000 mg by mouth 3 (three) times daily as needed (pain.).    Marland Kitchen acetaminophen-codeine (TYLENOL #3) 300-30 MG tablet Take 1 tablet by mouth 2 (two) times daily as needed for moderate pain.    Marland Kitchen albuterol (VENTOLIN HFA) 108 (90 Base) MCG/ACT inhaler Inhale 1-2 puffs into the lungs every 4 (four) hours as needed for wheezing or shortness of breath.    Marland Kitchen alendronate (FOSAMAX) 70 MG tablet Take 70 mg by mouth every Tuesday.     Marland Kitchen amoxicillin-clavulanate (AUGMENTIN) 875-125 MG tablet Take 1 tablet by mouth every 12 (twelve) hours.    . Ascorbic Acid (VITAMIN C) 1000 MG tablet Take 1,000 mg by mouth daily.     . B Complex-C (B-COMPLEX WITH VITAMIN C) tablet Take 1 tablet by mouth daily.     Marland Kitchen BREO ELLIPTA 100-25 MCG/INH AEPB Inhale 1 puff into the lungs daily as needed (asthma).     . Calcium Carb-Cholecalciferol (CALCIUM 500 + D3 PO) Take 1 tablet by mouth daily.     . cyclobenzaprine (FLEXERIL) 5 MG tablet Take 1 tablet (5 mg total) by mouth 2 (two) times daily as needed for muscle spasms. 30 tablet 0  . cyclobenzaprine (FLEXERIL) 5 MG tablet Take 5 mg by mouth at bedtime.    . famotidine (PEPCID) 40 MG tablet Take 40 mg by mouth every evening.    . gabapentin (NEURONTIN) 100 MG capsule Take 100 mg by mouth 3 (three) times daily.     Marland Kitchen  HYDROcodone-acetaminophen (NORCO/VICODIN) 5-325 MG tablet Take 1 tablet by mouth every 6 (six) hours as needed (pain). 15 tablet 0  . ipratropium-albuterol (DUONEB) 0.5-2.5 (3) MG/3ML SOLN Take 3 mLs by nebulization in the morning, at noon, and at bedtime.    . Multiple Vitamin (MULTIVITAMIN WITH MINERALS) TABS tablet Take 1 tablet by mouth daily.    . Probiotic Product (PROBIOTIC DAILY PO) Take 1 capsule by mouth daily.    . traZODone (DESYREL) 50 MG tablet Take 50 mg by mouth at bedtime.     Facility-Administered Medications Ordered in Other Visits  Medication Dose Route Frequency Provider Last Rate Last Admin  . 0.9 %  sodium chloride infusion   Intravenous Continuous Athena Masse, MD 100 mL/hr at 11/09/19 0640 New Bag at 11/09/19 0640  . acetaminophen (TYLENOL) tablet 650 mg  650 mg Oral Q6H PRN Athena Masse, MD       Or  . acetaminophen (TYLENOL) suppository 650 mg  650 mg Rectal Q6H PRN Athena Masse, MD      . azithromycin (ZITHROMAX) 500 mg in sodium chloride 0.9 % 250 mL IVPB  500 mg Intravenous Q24H Judd Gaudier V, MD      . cefTRIAXone (ROCEPHIN) 2 g in sodium chloride 0.9 % 100 mL IVPB  2 g Intravenous Q24H Athena Masse, MD   Stopped at 11/09/19 [phone]  . enoxaparin (LOVENOX) injection 30 mg  30 mg Subcutaneous Q24H Judd Gaudier V, MD      . iohexol (OMNIPAQUE) 9 MG/ML oral solution 500 mL  500 mL Oral BID PRN Paulette Blanch, MD   500 mL at 11/09/19 0213  . metroNIDAZOLE (FLAGYL) IVPB 500 mg  500 mg Intravenous Q8H Athena Masse, MD   Stopped at 11/09/19 [phone]  . ondansetron (ZOFRAN) tablet 4 mg  4 mg Oral Q6H PRN Athena Masse, MD       Or  . ondansetron [facility]) injection 4 mg  4 mg Intravenous Q6H PRN Athena Masse, MD   4 mg at 11/09/19 0608    Review of Systems  Constitutional: Positive for malaise/fatigue and weight loss (weighs 88 lbs). Negative for chills, diaphoresis and fever.       Does not feel any better.  HENT: Negative.  Negative for congestion, ear  discharge, ear pain, hearing loss, nosebleeds, sinus pain, sore throat and tinnitus.   Eyes: Negative.  Negative for blurred vision.  Respiratory: Positive for cough, sputum production (occasional mucus) and shortness of breath (x 2-3 nights, better). Negative for hemoptysis.   Cardiovascular: Negative.  Negative for chest pain, palpitations and leg swelling.  Gastrointestinal: Positive for abdominal pain. Negative for blood in stool, constipation, diarrhea, heartburn, melena, nausea and vomiting.       Decreased appetite. Lactose intolerant. Drinking protein smoothies.  Genitourinary: Negative.  Negative for dysuria, frequency, hematuria and urgency.  Musculoskeletal: Positive for joint pain (right shoulder) and neck pain (pinched nerves at C5-C6). Negative for back pain and myalgias.  Skin: Negative.  Negative for itching and rash.  Neurological: Positive for weakness (worsening) and headaches (mild, felt due to allergies). Negative for dizziness, tingling and sensory change.  Endo/Heme/Allergies: Negative.  Does not bruise/bleed easily.  Psychiatric/Behavioral: Negative.  Negative for depression and memory loss. The patient is not nervous/anxious and does not have insomnia.   All other systems reviewed and are negative.  Performance status (ECOG): 2***  Vital Signs There were no vitals taken for this visit.  Participants in the patient's visit and their role in the encounter included the patient, ***, and Waymon Budge, RN or Vito Berger, CMA, today.  The intake visit was provided by *** Waymon Budge, RN or Vito Berger, [place].  Physical Exam Nursing note reviewed.  Constitutional:      General: She is not in acute distress.    Appearance: She is not diaphoretic.  HENT:     Head:     Comments: Short gray hair. Eyes:     General: No scleral icterus.    Extraocular Movements: Extraocular movements intact.     Conjunctiva/sclera: Conjunctivae normal.     Comments:  Blue eyes.  Abdominal:     Palpations: There is no hepatomegaly or splenomegaly.  Neurological:     Mental Status: She is alert and oriented to person, place, and time. Mental status is at baseline.  Psychiatric:        Mood and Affect: Mood normal.        Behavior: Behavior normal.        Thought Content: Thought content normal.        Judgment: Judgment normal.  Admission on 11/09/2019  Component Date Value Ref Range Status  . Specimen Description 11/09/2019 BLOOD BLOOD LEFT FOREARM   Final  . Special Requests 11/09/2019 BOTTLES DRAWN AEROBIC AND ANAEROBIC Blood Culture adequate volume   Final  . Culture 11/09/2019    Final                   Value:NO GROWTH < 12 HOURS Performed at [facility], [address], [place], [place] [address]   . Report Status 11/09/2019 PENDING   Incomplete  . Specimen Description 11/09/2019 BLOOD BLOOD RIGHT FOREARM   Final  . Special Requests 11/09/2019 BOTTLES DRAWN AEROBIC AND ANAEROBIC Blood Culture adequate volume   Final  . Culture 11/09/2019    Final                   Value:NO GROWTH < 12 HOURS Performed at [facility], [address] [place], [place] [address]   . Report Status 11/09/2019 PENDING   Incomplete  . Lactic Acid, Venous 11/09/2019 1.9  0.5 - 1.9 mmol/L Final   Performed at [facility], [address] [place], [place] [address]  . WBC 11/09/2019 31.1* 4.0 - 10.5 K/uL Final  . RBC 11/09/2019 4.69  3.87 - 5.11 MIL/uL Final  . Hemoglobin 11/09/2019 12.7  12.0 - 15.0 g/dL Final  . HCT 11/09/2019 40.4  36 - 46 % Final  . MCV 11/09/2019 86.1  80.0 - 100.0 fL Final  . MCH 11/09/2019 27.1  26.0 - 34.0 pg Final  . MCHC 11/09/2019 31.4  30.0 - 36.0 g/dL Final  . RDW 11/09/2019 15.7* 11.5 - 15.5 % Final  . Platelets 11/09/2019 545* 150 - 400 K/uL Final  . nRBC 11/09/2019 0.0  0.0 - 0.2 % Final  . Neutrophils Relative % 11/09/2019 94  % Final  . Neutro Abs 11/09/2019 29.4* 1.7 - 7.7 K/uL Final  .  Lymphocytes Relative 11/09/2019 2  % Final  . Lymphs Abs 11/09/2019 0.5* 0.7 - 4.0 K/uL Final  . Monocytes Relative 11/09/2019 3  % Final  . Monocytes Absolute 11/09/2019 0.9  0 - 1 K/uL Final  . Eosinophils Relative 11/09/2019 0  % Final  . Eosinophils Absolute 11/09/2019 0.0  0 - 0 K/uL Final  . Basophils Relative 11/09/2019 0  % Final  . Basophils Absolute 11/09/2019 0.1  0 - 0 K/uL Final  . Immature Granulocytes 11/09/2019 1  % Final  . Abs Immature Granulocytes 11/09/2019 0.24* 0.00 - 0.07 K/uL Final   Performed at [facility], [address], [place], [place] [address]  . Sodium 11/09/2019 131* 135 - 145 mmol/L Final  . Potassium 11/09/2019 3.5  3.5 - 5.1 mmol/L Final  . Chloride 11/09/2019 96* 98 - 111 mmol/L Final  . CO2 11/09/2019 24  22 - 32 mmol/L Final  . Glucose, Bld 11/09/2019 170* 70 - 99 mg/dL Final   Glucose reference range applies only to samples taken after fasting for at least 8 hours.  . BUN 11/09/2019 14  8 - 23 mg/dL Final  . Creatinine, Ser 11/09/2019 0.77  0.44 - 1.00 mg/dL Final  . Calcium 11/09/2019 8.8* 8.9 - 10.3 mg/dL Final  . Total Protein 11/09/2019 6.5  6.5 - 8.1 g/dL Final  . Albumin 11/09/2019 3.0* 3.5 - 5.0 g/dL Final  . AST 11/09/2019 12* 15 - 41 U/L Final  . ALT 11/09/2019 13  0 - 44 U/L Final  . Alkaline Phosphatase 11/09/2019 100  38 - 126 U/L Final  . Total Bilirubin 11/09/2019 0.9  0.3 - 1.2 mg/dL Final  . GFR calc non Af Amer 11/09/2019 >60  >60 mL/min Final  . GFR calc Af Amer 11/09/2019 >60  >60 mL/min Final  . Anion gap 11/09/2019 11  5 - 15 Final   Performed at [facility], [address], [place], [place] [address]  . Troponin I (High Sensitivity) 11/09/2019 8  <18 ng/L Final   Comment: (NOTE) Elevated high sensitivity troponin I (hsTnI) values and significant  changes across serial measurements may suggest ACS but many other  chronic and acute conditions are known to elevate hsTnI results.  Refer to the "Links"  section for chest pain algorithms and additional  guidance. Performed at [facility], [address] [place], [place] [address]   . SARS Coronavirus 2 11/09/2019 NEGATIVE  NEGATIVE Final   Comment: (NOTE) SARS-CoV-2 target nucleic acids are NOT DETECTED.  The SARS-CoV-2 RNA is generally detectable in upper and lower respiratory specimens during the acute phase of infection. The lowest concentration of SARS-CoV-2 viral copies this assay can detect is 250 copies / mL. A negative result does not preclude SARS-CoV-2 infection and should not be used as the sole basis for treatment or other patient management decisions.  A negative result may occur with improper specimen collection / handling, submission of specimen other than nasopharyngeal swab, presence of viral mutation(s) within the areas targeted by this assay, and inadequate number of viral copies (<250 copies / mL). A negative result must be combined with clinical observations, patient history, and epidemiological information.  Fact Sheet for Patients:   StrictlyIdeas.no  Fact Sheet for Healthcare Providers: BankingDealers.co.za  This test is not yet approved or                           cleared by the [place] FDA and has been authorized for detection and/or diagnosis of SARS-CoV-2 by FDA under an Emergency Use Authorization (EUA).  This EUA will remain in effect (meaning this test can be used) for the duration of the COVID-19 declaration under Section 564(b)(1) of the Act, 21 U.S.C. section 360bbb-3(b)(1), unless the authorization is terminated or revoked sooner.  Performed at [facility], [address], [place], [place] [address]   . Procalcitonin 11/09/2019 1.57  ng/mL Final   Comment:        Interpretation: PCT > 0.5 ng/mL and <= 2 ng/mL: Systemic infection (sepsis) is possible, but other conditions are known to elevate PCT as  well. (NOTE)       Sepsis PCT Algorithm           Lower Respiratory Tract                                      Infection PCT Algorithm    ----------------------------     ----------------------------         PCT < 0.25 ng/mL                PCT < 0.10 ng/mL          Strongly encourage             Strongly discourage   discontinuation of antibiotics    initiation of antibiotics    ----------------------------     -----------------------------       PCT 0.25 -  0.50 ng/mL            PCT 0.10 - 0.25 ng/mL               OR       >80% decrease in PCT            Discourage initiation of                                            antibiotics      Encourage discontinuation           of antibiotics    ----------------------------     -----------------------------         PCT >= 0.50 ng/mL              PCT 0.26 - 0.50 ng/mL                                         AND       <80% decrease in PCT             Encourage initiation of                                             antibiotics       Encourage continuation           of antibiotics    ----------------------------     -----------------------------        PCT >= 0.50 ng/mL                  PCT > 0.50 ng/mL               AND         increase in PCT                  Strongly encourage                                      initiation of antibiotics    Strongly encourage escalation           of antibiotics                                     -----------------------------                                           PCT <= 0.25 ng/mL                                                 OR                                        >  80% decrease in PCT                                      Discontinue / Do not initiate                                             antibiotics  Performed at [facility], [address] [place] [place], [place] [address]   . Troponin I (High Sensitivity) 11/09/2019 10  <18 ng/L Final   Comment: (NOTE) Elevated high  sensitivity troponin I (hsTnI) values and significant  changes across serial measurements may suggest ACS but many other  chronic and acute conditions are known to elevate hsTnI results.  Refer to the "Links" section for chest pain algorithms and additional  guidance. Performed at [facility], [address] [place], [place] [address]     Assessment:  Anne Hardy is a 69 y.o. female with a right lung mass and mediastinal adenopathy.  She has a > 35 pack year smoking history.  She presented with cough, shortness of breath, and weight loss.  Chest CT with contrast on 10/07/2019 revealed a rounded 4.0 x 3.9 cm suprahilar mass of the posterior right upper lobe. There was associated soft tissue and or mucous obstruction/impaction of the right mainstem bronchus and the right upper lobe bronchi. There was bulky pretracheal, right hilar, and subcarinal lymphadenopathy. The largest pretracheal node or conglomerate node measured 4.1 x 3.3 cm.  Findings were consistent with primary lung malignancy with nodal metastatic disease. There was emphysema, trace bilateral pleural effusions, and trace pericardial effusion.  PET scan on 10/27/2019 revealed previously demonstrated right upper lobe lung mass and the confluent right hilar and mediastinal adenopathy were hypermetabolic c/w bronchogenic carcinoma. Central necrosis suggested squamous cell carcinoma. There were no definite distant metastases. Peripheral metabolic activity in the inferior aspect of the right hepatic lobe is relatively linear in orientation and without clear corresponding finding on the CT images. There were stable compression deformities at T10, T11 and T12.  She has a history of a CVA on 06/2018. She is on Eliquis.  The patient received the Moderna COVID-19 vaccine on 06/23/2019 and 07/21/2019.  Symptomatically, ***  Plan: 1.   Review labs and scans   2.   Right lung mass  Discuss concern for stage III lung  cancer.  Review PET scan with patient.  Images personally reviewed.  Agree with radiology interpretation   She has a hypermetabolic RUL lung mass with confluent hilar and mediastinal adenopathy.   She appears to have no evidence of metastatic disease.  Discuss plan for bronchoscopy on 11/05/2019.  Anticipate concurrent chemotherapy and radiation followed by durvalumab if pathology c/w non-small cell lung cancer.  Discuss head MRI- patient agrees.  Present at tumor board on 11/11/2019. 3.   Normocytic anemia  Hematocrit 33.7.  Hemoglobin 11.1 and MCV 83.6 on 10/22/2019.   Ferritin 114 with an iron saturation of 11% and a TIBC of 241 on 10/22/2019.   B12 568 and folate 5.7 (low) on 10/22/2019.  Hematocrit 37.3.  Hemoglobin 12.0 and MCV 82.9 on 11/01/2019. 4.   Weight loss  Patient notes ongoing weight loss.  Discuss importance of caloric intake.  Patient denies referral to dietitian. 5.   Head MRI on 11/09/2019. 6.  Tumor board on 07/29/20201. 7.   RTC on 11/10/2019 via telemedicine for MD assessment, review of pathology from bronchoscopy and review of head MRI.  I discussed the assessment and treatment plan with the patient.  The patient was provided an opportunity to ask questions and all were answered.  The patient agreed with the plan and demonstrated an understanding of the instructions.  The patient was advised to call back if the symptoms worsen or if the condition fails to improve as anticipated.  I provided *** minutes (8:39 AM - x:xx) of face-to-face video visit time during this this encounter and > 50% was spent counseling as documented under my assessment and plan.  I provided these services from the [facility] office.  Melissa C. Mike Gip, MD, PhD    10/21/2019, 11:29 AM  I, Mirian Mo Tufford, am acting as Education administrator for [employer]. Mike Gip, MD, PhD.  I, Melissa C. Mike Gip, MD, have reviewed the above documentation for accuracy and completeness, and I agree with the above.

## 2019-11-09 NOTE — ED Notes (Signed)
Pt refusing in and out cath at this time./ Pt was placed on the bedpan by EDT Mayra and still unable to produce a urine sample. EDP notified. Pt requesting a chance/time to "go"

## 2019-11-09 NOTE — Progress Notes (Signed)
CODE SEPSIS - PHARMACY COMMUNICATION  **Broad Spectrum Antibiotics should be administered within 1 hour of Sepsis diagnosis**  Time Code Sepsis Called/Page Received: 2257  Antibiotics Ordered: vanc/zosyn  Time of 1st antibiotic administration: 0318  Additional action taken by pharmacy:   If necessary, Name of Provider/Nurse Contacted:     Tobie Lords ,PharmD Clinical Pharmacist  11/09/2019  5:50 AM

## 2019-11-09 NOTE — ED Notes (Signed)
Santiago Glad from [place] notified of pt's contrast intake, orders via MD Beather Arbour to proceed with CT Scan

## 2019-11-09 NOTE — ED Provider Notes (Signed)
[facility] Emergency Department Provider Note   ____________________________________________   First MD Initiated Contact with Patient 11/09/19 0136     (approximate)  I have reviewed the triage vital signs and the nursing notes.   HISTORY  Chief Complaint Fatigue and Weakness    HPI Anne Hardy is a 69 y.o. female brought to the ED via EMS from home with a chief complaint of generalized weakness.  Patient has a history of COPD on home oxygen, recent pneumonia and recent diagnosis of stage III lung cancer.  Reports decreased p.o. intake and increased generalized weakness over the past several days.  Endorses productive cough.  Denies fever, chest pain.  Notes left lower quadrant abdominal pain tonight associated with nausea/vomiting and diarrhea.       Past Medical History:  Diagnosis Date  . Arthritis   . Cervical disc disease   . COPD (chronic obstructive pulmonary disease) ([place])   . Dyspnea   . GERD (gastroesophageal reflux disease)   . Headache    severe headaches  . Stroke [facility]) 06/2018   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-no residual effedcts-this happened in [place]-Pt just moved to [place] 2 weeks ago (October 2020)    Patient Active Problem List   Diagnosis Date Noted  . 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 Anemia 10/24/2019  . Weight loss 10/24/2019  . Mass of right lung 10/22/2019    Past Surgical History:  Procedure Laterality Date  . COLONOSCOPY WITH ESOPHAGOGASTRODUODENOSCOPY (EGD)    . femur fx repair    . FRACTURE SURGERY  2018   femur fx  . KYPHOPLASTY N/A 01/28/2019   Procedure: T12 KYPHOPLASTY;  Surgeon: Hessie Knows, MD;  Location: [facility] ORS;  Service: Orthopedics;  Laterality: N/A;  . OOPHORECTOMY Left   . VIDEO BRONCHOSCOPY WITH ENDOBRONCHIAL NAVIGATION N/A 11/05/2019   Procedure: VIDEO BRONCHOSCOPY WITH ENDOBRONCHIAL NAVIGATION;  Surgeon: Ottie Glazier, MD;  Location: [facility] ORS;  Service: Thoracic;   Laterality: N/A;  . VIDEO BRONCHOSCOPY WITH ENDOBRONCHIAL ULTRASOUND N/A 11/05/2019   Procedure: VIDEO BRONCHOSCOPY WITH ENDOBRONCHIAL ULTRASOUND;  Surgeon: Ottie Glazier, MD;  Location: [facility] ORS;  Service: Thoracic;  Laterality: N/A;    Prior to Admission medications   Medication Sig Start Date End Date Taking? Authorizing Provider  acetaminophen (TYLENOL) 500 MG tablet Take 1,000 mg by mouth 3 (three) times daily as needed (pain.).    [provider]  acetaminophen-codeine (TYLENOL #3) 300-30 MG tablet Take 1 tablet by mouth 2 (two) times daily as needed for moderate pain.    [provider]  albuterol (VENTOLIN HFA) 108 (90 Base) MCG/ACT inhaler Inhale 1-2 puffs into the lungs every 4 (four) hours as needed for wheezing or shortness of breath.    [provider]  alendronate (FOSAMAX) 70 MG tablet Take 70 mg by mouth every Tuesday.  07/27/19   [provider]  Ascorbic Acid (VITAMIN C) 1000 MG tablet Take 1,000 mg by mouth daily.     [provider]  B Complex-C (B-COMPLEX WITH VITAMIN C) tablet Take 1 tablet by mouth daily.     [provider]  BREO ELLIPTA 100-25 MCG/INH AEPB Inhale 1 puff into the lungs daily as needed (asthma).  10/13/19   [provider]  Calcium Carb-Cholecalciferol (CALCIUM 500 + D3 PO) Take 1 tablet by mouth daily.     [provider]  cyclobenzaprine (FLEXERIL) 5 MG tablet Take 1 tablet (5 mg total) by mouth 2 (two) times daily as  needed for muscle spasms. Patient taking differently: Take 5 mg by mouth at bedtime. And prn 01/11/19   Sable Feil, PA-C  famotidine (PEPCID) 40 MG tablet Take 40 mg by mouth every evening.    [provider]  gabapentin (NEURONTIN) 100 MG capsule Take 100 mg by mouth 3 (three) times daily.  10/14/19   [provider]  HYDROcodone-acetaminophen (NORCO/VICODIN) 5-325 MG tablet Take 1 tablet by mouth every 6 (six) hours as needed (pain). 01/28/19   Hessie Knows, MD  ipratropium-albuterol (DUONEB) 0.5-2.5 (3) MG/3ML SOLN Take 3 mLs by nebulization in the morning, at noon, and at bedtime.    [provider]  Multiple Vitamin (MULTIVITAMIN WITH MINERALS) TABS tablet Take 1 tablet by mouth daily.    [provider]  Probiotic Product (PROBIOTIC DAILY PO) Take 1 capsule by mouth daily.    [provider]  traZODone (DESYREL) 50 MG tablet Take 50 mg by mouth at bedtime.    [provider]    Allergies Lactose intolerance (gi)  History reviewed. No pertinent family history.  Social History Social History   Tobacco Use  . Smoking status: Current Every Day Smoker    Years: 50.00    Types: Cigarettes  . Smokeless tobacco: Never Used  . Tobacco comment: 6-8 cig per day  Vaping Use  . Vaping Use: Never used  Substance Use Topics  . Alcohol use: Yes    Comment:  1 drink daily  . Drug use: Never    Review of Systems  Constitutional: Positive for generalized weakness.  No fever/chills Eyes: No visual changes. ENT: No sore throat. Cardiovascular: Denies chest pain. Respiratory: Positive for cough and shortness of breath. Gastrointestinal: Positive for abdominal pain, nausea, vomiting and diarrhea.  No constipation. Genitourinary: Negative for dysuria. Musculoskeletal: Negative for back pain. Skin: Negative for rash. Neurological: Negative for headaches, focal weakness or numbness.   ____________________________________________   PHYSICAL EXAM:  VITAL SIGNS: ED Triage Vitals [11/09/19 0135]  Enc Vitals Group     BP      Pulse      Resp      Temp      Temp src      SpO2 99 %     Weight      Height      Head Circumference      Peak Flow      Pain Score      Pain Loc      Pain Edu?      Excl. in [place]?     Constitutional: Alert and oriented.  Cachectic appearing and in mild acute distress. Eyes: Conjunctivae are normal. PERRL. EOMI. Head: Atraumatic. Nose: No  congestion/rhinnorhea. Mouth/Throat: Mucous membranes are mildly dry.   Neck: No stridor.   Cardiovascular: Normal rate, regular rhythm. Grossly normal heart sounds.  Good peripheral circulation. Respiratory: Increased respiratory effort.  No retractions. Lungs diminished bibasilarly. Gastrointestinal: Soft and mildly tender to palpation left lower quadrant without rebound or guarding. No distention. No abdominal bruits. No CVA tenderness. Musculoskeletal: No lower extremity tenderness nor edema.  No joint effusions. Neurologic:  Normal speech and language. No gross focal neurologic deficits are appreciated.  Skin:  Skin is warm, dry and intact. No rash noted. Psychiatric: Mood and affect are normal. Speech and behavior are normal.  ____________________________________________   LABS (all labs ordered are listed, but only abnormal results are displayed)  Labs Reviewed  CBC WITH DIFFERENTIAL/PLATELET - Abnormal; Notable for the following components:  Result Value   WBC 31.1 (*)    RDW 15.7 (*)    Platelets 545 (*)    Neutro Abs 29.4 (*)    Lymphs Abs 0.5 (*)    Abs Immature Granulocytes 0.24 (*)    All other components within normal limits  COMPREHENSIVE METABOLIC PANEL - Abnormal; Notable for the following components:   Sodium 131 (*)    Chloride 96 (*)    Glucose, Bld 170 (*)    Calcium 8.8 (*)    Albumin 3.0 (*)    AST 12 (*)    All other components within normal limits  SARS CORONAVIRUS 2 BY RT PCR (HOSPITAL ORDER, [place] [facility])  CULTURE, BLOOD (ROUTINE X 2)  CULTURE, BLOOD (ROUTINE X 2)  URINE CULTURE  LACTIC ACID, PLASMA  PROCALCITONIN  URINALYSIS, COMPLETE (UACMP) WITH MICROSCOPIC  TROPONIN I (HIGH SENSITIVITY)  TROPONIN I (HIGH SENSITIVITY)   ____________________________________________  EKG  ED ECG REPORT I, Ferrell Flam J, the attending physician, personally viewed and interpreted this ECG.   Date: 11/09/2019  EKG Time: 0136   Rate: 109  Rhythm: sinus tachycardia  Axis: LAD  Intervals:left anterior fascicular block  ST&T Change: Nonspecific  ____________________________________________  RADIOLOGY  ED MD interpretation: Chest x-ray not significantly changed from prior exam; CT scan demonstrates right lower lobe consolidation similar to prior CT; mild colitis in distal colon  Official radiology report(s): CT Abdomen Pelvis W Contrast  Result Date: 11/09/2019 CLINICAL DATA:  History of lung carcinoma with abdominal pain, initial encounter EXAM: CT ABDOMEN AND PELVIS WITH CONTRAST TECHNIQUE: Multidetector CT imaging of the abdomen and pelvis was performed using the standard protocol following bolus administration of intravenous contrast. CONTRAST:  52mL OMNIPAQUE IOHEXOL 300 MG/ML  SOLN COMPARISON:  10/27/2019 PET-CT FINDINGS: Lower chest: Consolidation in the right lower lobe is noted. Small right pleural effusion is noted. The left lung is clear. On delayed images there is a focal soft tissue lesion in the right hilum which corresponds to areas of increased activity on recent PET-CT consistent with lymphadenopathy. Hepatobiliary: No focal liver abnormality is seen. No gallstones, gallbladder wall thickening, or biliary dilatation. No focal mass lesion is noted to correspond with the area of increased metabolic activity on recent PET-CT. Pancreas: Unremarkable. No pancreatic ductal dilatation or surrounding inflammatory changes. Spleen: Normal in size without focal abnormality. Adrenals/Urinary Tract: Adrenal glands are within normal limits. Kidneys are well visualized bilaterally within normal enhancement pattern. Delayed images demonstrate normal excretion of contrast material. The bladder is within normal limits. Stomach/Bowel: Appendix is not well visualized. The distal colon is decompressed with mild inflammatory change identified suggestive of mild colitis. These changes are new from the prior PET-CT. No small bowel  abnormality is noted. The stomach is within normal limits. Vascular/Lymphatic: Aortic calcifications are seen. No significant lymphadenopathy is noted. Reproductive: Small calcified uterine fibroids are seen. Other: No abdominal wall hernia or abnormality. No abdominopelvic ascites. Musculoskeletal: Right hip fixation is noted. No acute bony abnormality is seen. Changes of prior vertebral augmentation are noted at T12. T10 compression deformity is noted. Mild L4 compression deformity is noted as well. IMPRESSION: Right lower lobe consolidation as well as lymphadenopathy in the right hilar region similar to that seen on prior PET-CT. Changes of mild colitis in the distal colon. Chronic compression deformities. Electronically Signed   By: Inez Catalina M.D.   On: 11/09/2019 03:59   DG Chest Port 1 View  Result Date: 11/09/2019 CLINICAL DATA:  Weakness EXAM: PORTABLE  CHEST 1 VIEW COMPARISON:  November 05, 2019 FINDINGS: The heart size and mediastinal contours are unchanged. There appears to be a right suprahilar ill-defined mass/airspace opacity as on the prior exam. There is elevation of the right hemidiaphragm with subsegmental atelectasis. There is a small right pleural effusion present. The left lung is clear. No acute osseous abnormality. IMPRESSION: Right suprahilar mass with surrounding airspace opacities, not significantly changed since prior exam. Probable small right pleural effusion. Electronically Signed   By: Prudencio Pair M.D.   On: 11/09/2019 02:06    ____________________________________________   PROCEDURES  Procedure(s) performed (including Critical Care):  .1-3 Lead EKG Interpretation Performed by: Paulette Blanch, MD Authorized by: Paulette Blanch, MD     Interpretation: abnormal     ECG rate:  105   ECG rate assessment: tachycardic     Rhythm: sinus tachycardia     Ectopy: none     Conduction: normal   Comments:     Patient placed on cardiac monitor to evaluate for  arrhythmias     ____________________________________________   INITIAL IMPRESSION / ASSESSMENT AND PLAN / ED COURSE  As part of my medical decision making, I reviewed the following data within the [facility] notes reviewed and incorporated, Labs reviewed, EKG interpreted, Old chart reviewed, Radiograph reviewed, Discussed with admitting physician and Notes from prior ED visits     Sharilyn Geisinger was evaluated in Emergency Department on 11/09/2019 for the symptoms described in the history of present illness. She was evaluated in the context of the global COVID-19 pandemic, which necessitated consideration that the patient might be at risk for infection with the SARS-CoV-2 virus that causes COVID-19. Institutional protocols and algorithms that pertain to the evaluation of patients at risk for COVID-19 are in a state of rapid change based on information released by regulatory bodies including the CDC and federal and state organizations. These policies and algorithms were followed during the patient's care in the ED.    69 year old female with newly diagnosed lung cancer presenting with generalized weakness, left lower quadrant abdominal pain associated with nausea/vomiting/diarrhea. Differential diagnosis includes, but is not limited to, ovarian cyst, ovarian torsion, acute appendicitis, diverticulitis, urinary tract infection/pyelonephritis, endometriosis, bowel obstruction, colitis, renal colic, gastroenteritis, hernia, fibroids, etc.  Will obtain lab work, chest x-ray, CT abdomen/pelvis.  Initiate IV fluid resuscitation.  Patient declines analgesia/antiemetic at this time.   Clinical Course as of Nov 09 439  Tue Nov 09, 2019  0236 Noted leukocytosis which is increased from prior.  Will empirically cover with broad-spectrum antibiotics.   [JS]  8916 CT findings demonstrate right lower lobe consolidation and mild colitis.  Patient has been covered with broad-spectrum  IV antibiotics.  Elevated procalcitonin level noted.  Will discuss with hospitalist services for admission.   [JS]    Clinical Course User Index [JS] Paulette Blanch, MD     ____________________________________________   FINAL CLINICAL IMPRESSION(S) / ED DIAGNOSES  Final diagnoses:  Generalized weakness  Hyponatremia  Leukocytosis, unspecified type  Colitis     ED Discharge Orders    None       Note:  This document was prepared using Dragon voice recognition software and may include unintentional dictation errors.   Paulette Blanch, MD 11/09/19 [phone]

## 2019-11-09 NOTE — H&P (Signed)
History and Physical    Anne Hardy STM:[id number] DOB: [birth year] DOA: 11/09/2019  PCP: Toni Arthurs, NP   Patient coming from: home  I have personally briefly reviewed patient's old medical records in [place]  Chief Complaint: Abdominal pain, nausea vomiting diarrhea  HPI: Anne Hardy is a 69 y.o. female with medical history significant for COPD on home oxygen, recent diagnosis of lung cancer who presents to the emergency room with a several day history of generalized weakness with poor oral intake and on the night of arrival starting with left lower quadrant pain, nausea vomiting and diarrhea.  Pain is of moderate to severe intensity crampy in LLQ, nonradiating with no aggravating or alleviating factors.  Has no fever or chills.  Does have a nonproductive cough ED Course: On arrival she was afebrile at 97.6 but tachycardic at 109, tachypneic at 26 hypotensive at 95/67.  BP improved with hydration to 126/84 and pulse improved to 86 with IV hydration blood work significant for WBC of 31,000, lactic acid 1.9, procalcitonin 1.57, creatinine normal at 0.77.  CT abdomen and pelvis showing right lower lobe consolidation, mild colitis distal colon.  Patient started on Zosyn and vancomycin.  Hospitalist consulted for admission.  Review of Systems: As per HPI otherwise all other systems on review of systems negative.    Past Medical History:  Diagnosis Date  . Arthritis   . Cervical disc disease   . COPD (chronic obstructive pulmonary disease) ([place])   . Dyspnea   . GERD (gastroesophageal reflux disease)   . Headache    severe headaches  . Stroke [facility]) 06/2018   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-no residual effedcts-this happened in [place]-Pt just moved to [place] 2 weeks ago (October 2020)    Past Surgical History:  Procedure Laterality Date  . COLONOSCOPY WITH ESOPHAGOGASTRODUODENOSCOPY (EGD)    . femur fx repair    . FRACTURE SURGERY  2018   femur fx  .  KYPHOPLASTY N/A 01/28/2019   Procedure: T12 KYPHOPLASTY;  Surgeon: Hessie Knows, MD;  Location: [facility] ORS;  Service: Orthopedics;  Laterality: N/A;  . OOPHORECTOMY Left   . VIDEO BRONCHOSCOPY WITH ENDOBRONCHIAL NAVIGATION N/A 11/05/2019   Procedure: VIDEO BRONCHOSCOPY WITH ENDOBRONCHIAL NAVIGATION;  Surgeon: Ottie Glazier, MD;  Location: [facility] ORS;  Service: Thoracic;  Laterality: N/A;  . VIDEO BRONCHOSCOPY WITH ENDOBRONCHIAL ULTRASOUND N/A 11/05/2019   Procedure: VIDEO BRONCHOSCOPY WITH ENDOBRONCHIAL ULTRASOUND;  Surgeon: Ottie Glazier, MD;  Location: [facility] ORS;  Service: Thoracic;  Laterality: N/A;     reports that she has been smoking cigarettes. She has smoked for the past 50.00 years. She has never used smokeless tobacco. She reports current alcohol use. She reports that she does not use drugs.  Allergies  Allergen Reactions  . Lactose Intolerance (Gi)     History reviewed. No pertinent family history.    Prior to Admission medications   Medication Sig Start Date End Date Taking? Authorizing Provider  acetaminophen (TYLENOL) 500 MG tablet Take 1,000 mg by mouth 3 (three) times daily as needed (pain.).    [provider]  acetaminophen-codeine (TYLENOL #3) 300-30 MG tablet Take 1 tablet by mouth 2 (two) times daily as needed for moderate pain.    [provider]  albuterol (VENTOLIN HFA) 108 (90 Base) MCG/ACT inhaler Inhale 1-2 puffs into the lungs every 4 (four) hours as needed for wheezing or shortness of breath.    [provider]  alendronate (FOSAMAX) 70 MG tablet Take 70  mg by mouth every Tuesday.  07/27/19   [provider]  amoxicillin-clavulanate (AUGMENTIN) 875-125 MG tablet Take 1 tablet by mouth every 12 (twelve) hours. 11/05/19   [provider]  Ascorbic Acid (VITAMIN C) 1000 MG tablet Take 1,000 mg by mouth daily.     [provider]  B Complex-C (B-COMPLEX WITH VITAMIN C) tablet Take 1 tablet by mouth daily.      [provider]  BREO ELLIPTA 100-25 MCG/INH AEPB Inhale 1 puff into the lungs daily as needed (asthma).  10/13/19   [provider]  Calcium Carb-Cholecalciferol (CALCIUM 500 + D3 PO) Take 1 tablet by mouth daily.     [provider]  cyclobenzaprine (FLEXERIL) 5 MG tablet Take 1 tablet (5 mg total) by mouth 2 (two) times daily as needed for muscle spasms. Patient taking differently: Take 5 mg by mouth at bedtime. And prn 01/11/19   Sable Feil, PA-C  famotidine (PEPCID) 40 MG tablet Take 40 mg by mouth every evening.    [provider]  gabapentin (NEURONTIN) 100 MG capsule Take 100 mg by mouth 3 (three) times daily.  10/14/19   [provider]  HYDROcodone-acetaminophen (NORCO/VICODIN) 5-325 MG tablet Take 1 tablet by mouth every 6 (six) hours as needed (pain). 01/28/19   Hessie Knows, MD  ipratropium-albuterol (DUONEB) 0.5-2.5 (3) MG/3ML SOLN Take 3 mLs by nebulization in the morning, at noon, and at bedtime.    [provider]  Multiple Vitamin (MULTIVITAMIN WITH MINERALS) TABS tablet Take 1 tablet by mouth daily.    [provider]  Probiotic Product (PROBIOTIC DAILY PO) Take 1 capsule by mouth daily.    [provider]  traZODone (DESYREL) 50 MG tablet Take 50 mg by mouth at bedtime.    [provider]    Physical Exam: Vitals:   11/09/19 0135 11/09/19 0143 11/09/19 0144  BP:  95/67   Pulse:  (!) 109   Resp:  (!) 26   Temp:  97.8 F (36.6 C)   TempSrc:  Oral   SpO2: 99% 93%   Weight:   (!) 39.9 kg  Height:   5\' 3"  (1.6 m)     Vitals:   11/09/19 0135 11/09/19 0143 11/09/19 0144  BP:  95/67   Pulse:  (!) 109   Resp:  (!) 26   Temp:  97.8 F (36.6 C)   TempSrc:  Oral   SpO2: 99% 93%   Weight:   (!) 39.9 kg  Height:   5\' 3"  (1.6 m)      Constitutional:  Chronically ill-appearing, with conversational dyspnea HEENT:      Head: Normocephalic and atraumatic.         Eyes: PERLA, EOMI,  Conjunctivae are normal. Sclera is non-icteric.       Mouth/Throat: Mucous membranes are moist.       Neck: Supple with no signs of meningismus. Cardiovascular: Regular rate and rhythm. No murmurs, gallops, or rubs. 2+ symmetrical distal pulses are present . No JVD. No LE edema Respiratory: Respiratory effort increased.Lungs sounds diminished bilaterally.  Few wheezes,  Gastrointestinal:  Tender in LLQ, and non distended with positive bowel sounds. No rebound or guarding. Genitourinary: No CVA tenderness. Musculoskeletal: Nontender with normal range of motion in all extremities. No cyanosis, or erythema of extremities. Neurologic: Normal speech and language. Face is symmetric. Moving all extremities. No gross focal neurologic deficits . Skin: Skin is warm, dry.  No rash or ulcers Psychiatric: Mood and  affect are normal Speech and behavior are normal   Labs on Admission: I have personally reviewed following labs and imaging studies  CBC: Recent Labs  Lab 11/09/19 0136  WBC 31.1*  NEUTROABS 29.4*  HGB 12.7  HCT 40.4  MCV 86.1  PLT 409*   Basic Metabolic Panel: Recent Labs  Lab 11/09/19 0136  NA 131*  K 3.5  CL 96*  CO2 24  GLUCOSE 170*  BUN 14  CREATININE 0.77  CALCIUM 8.8*   GFR: Estimated Creatinine Clearance: 43 mL/min (by C-G formula based on SCr of 0.77 mg/dL). Liver Function Tests: Recent Labs  Lab 11/09/19 0136  AST 12*  ALT 13  ALKPHOS 100  BILITOT 0.9  PROT 6.5  ALBUMIN 3.0*   No results for input(s): LIPASE, AMYLASE in the last 168 hours. No results for input(s): AMMONIA in the last 168 hours. Coagulation Profile: No results for input(s): INR, PROTIME in the last 168 hours. Cardiac Enzymes: No results for input(s): CKTOTAL, CKMB, CKMBINDEX, TROPONINI in the last 168 hours. BNP (last 3 results) No results for input(s): PROBNP in the last 8760 hours. HbA1C: No results for input(s): HGBA1C in the last 72 hours. CBG: No results for input(s): GLUCAP  in the last 168 hours. Lipid Profile: No results for input(s): CHOL, HDL, LDLCALC, TRIG, CHOLHDL, LDLDIRECT in the last 72 hours. Thyroid Function Tests: No results for input(s): TSH, T4TOTAL, FREET4, T3FREE, THYROIDAB in the last 72 hours. Anemia Panel: No results for input(s): VITAMINB12, FOLATE, FERRITIN, TIBC, IRON, RETICCTPCT in the last 72 hours. Urine analysis: No results found for: COLORURINE, APPEARANCEUR, LABSPEC, PHURINE, GLUCOSEU, HGBUR, BILIRUBINUR, KETONESUR, PROTEINUR, UROBILINOGEN, NITRITE, LEUKOCYTESUR  Radiological Exams on Admission: CT Abdomen Pelvis W Contrast  Result Date: 11/09/2019 CLINICAL DATA:  History of lung carcinoma with abdominal pain, initial encounter EXAM: CT ABDOMEN AND PELVIS WITH CONTRAST TECHNIQUE: Multidetector CT imaging of the abdomen and pelvis was performed using the standard protocol following bolus administration of intravenous contrast. CONTRAST:  57mL OMNIPAQUE IOHEXOL 300 MG/ML  SOLN COMPARISON:  10/27/2019 PET-CT FINDINGS: Lower chest: Consolidation in the right lower lobe is noted. Small right pleural effusion is noted. The left lung is clear. On delayed images there is a focal soft tissue lesion in the right hilum which corresponds to areas of increased activity on recent PET-CT consistent with lymphadenopathy. Hepatobiliary: No focal liver abnormality is seen. No gallstones, gallbladder wall thickening, or biliary dilatation. No focal mass lesion is noted to correspond with the area of increased metabolic activity on recent PET-CT. Pancreas: Unremarkable. No pancreatic ductal dilatation or surrounding inflammatory changes. Spleen: Normal in size without focal abnormality. Adrenals/Urinary Tract: Adrenal glands are within normal limits. Kidneys are well visualized bilaterally within normal enhancement pattern. Delayed images demonstrate normal excretion of contrast material. The bladder is within normal limits. Stomach/Bowel: Appendix is not well  visualized. The distal colon is decompressed with mild inflammatory change identified suggestive of mild colitis. These changes are new from the prior PET-CT. No small bowel abnormality is noted. The stomach is within normal limits. Vascular/Lymphatic: Aortic calcifications are seen. No significant lymphadenopathy is noted. Reproductive: Small calcified uterine fibroids are seen. Other: No abdominal wall hernia or abnormality. No abdominopelvic ascites. Musculoskeletal: Right hip fixation is noted. No acute bony abnormality is seen. Changes of prior vertebral augmentation are noted at T12. T10 compression deformity is noted. Mild L4 compression deformity is noted as well. IMPRESSION: Right lower lobe consolidation as well as lymphadenopathy in the right hilar region similar to that  seen on prior PET-CT. Changes of mild colitis in the distal colon. Chronic compression deformities. Electronically Signed   By: Inez Catalina M.D.   On: 11/09/2019 03:59   DG Chest Port 1 View  Result Date: 11/09/2019 CLINICAL DATA:  Weakness EXAM: PORTABLE CHEST 1 VIEW COMPARISON:  November 05, 2019 FINDINGS: The heart size and mediastinal contours are unchanged. There appears to be a right suprahilar ill-defined mass/airspace opacity as on the prior exam. There is elevation of the right hemidiaphragm with subsegmental atelectasis. There is a small right pleural effusion present. The left lung is clear. No acute osseous abnormality. IMPRESSION: Right suprahilar mass with surrounding airspace opacities, not significantly changed since prior exam. Probable small right pleural effusion. Electronically Signed   By: Prudencio Pair M.D.   On: 11/09/2019 02:06    EKG: Independently reviewed. Interpretation : Sinus tachycardia with no acute ST-T wave changes  Assessment/Plan 69 year old female with history of COPD on home oxygen, recent diagnosis of lung cancer admitted with sepsis secondary to pneumonia and colitis after presenting with  weakness, cough, abdominal pain nausea vomiting and diarrhea.      Sepsis ([place])   Colitis -Sepsis believe mostly related to colitis.  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    DVT prophylaxis: Lovenox  Code Status: full code  Family Communication:  none  Disposition Plan: Back to previous home environment Consults called: none  Status:At the time of admission, it appears that the appropriate admission status for this patient is INPATIENT. This is judged to be reasonable and necessary in order to provide the required intensity of service to ensure the patient's safety given the presenting symptoms, physical exam findings, and initial radiographic and laboratory data in the context of their  Comorbid conditions.   Patient requires inpatient status due to high intensity of service, high risk for further deterioration and high frequency of surveillance required.   I certify that at the point of admission it is my clinical judgment that the patient will require inpatient hospital care spanning beyond [place] MD [facility]     11/09/2019, 5:44 AM

## 2019-11-09 NOTE — ED Notes (Signed)
Pt transferred to room 239

## 2019-11-09 NOTE — Progress Notes (Signed)
Same day rounding progress note  Patient seen and examined while in the emergency department waiting for the floor bed.   feels very tired, requesting stronger pain medication  Sepsis present on admission likely from colitis seen on CT scan and or right lower lobe pneumonia Check GI panel and stool for C. Difficile Continue empiric Rocephin, azithromycin, Flagyl -Was recently treated with Augmentin as an outpatient.  Right lower lobe pneumonia Continue Rocephin and gentamicin  COPD with chronic bronchitis Stable, continue home inhalers and oxygen as needed  Right lung mass/stage III lung cancer Followed by Dr. Mike Gip at cancer center and recent bronchoscopy on 7/23 by Dr. Lanney Gins -Dr. Mike Gip recommends MRI brain while inpatient which I have ordered.  I have requested oncology consultation.  Generalized weakness We will consult PT/OT  Time spent: 25 minutes

## 2019-11-09 NOTE — ED Notes (Signed)
Patient had loose bowel movement in bedpan. Cleaned and new brief placed. No further needs expressed at this time.

## 2019-11-09 NOTE — ED Triage Notes (Signed)
Pt to ED via [facility] for a CC of weakness/fatigue x a few days. Pt currently c/o RT lower abd pain and shoulder pain. Pt recently dx with stage 3 Lung CA and when images were preformed, they were unable to tell how much was malignant in relation to her notable aspiration. Pt has not underwent chemo/radiation.  Pt had a fall earlier tonight due to increased fatigue and decreased mobility/intake. Pt denies any LOC and st she hit her inner thigh upon falling.

## 2019-11-10 ENCOUNTER — Other Ambulatory Visit: Payer: Self-pay | Admitting: Anatomic Pathology & Clinical Pathology

## 2019-11-10 ENCOUNTER — Inpatient Hospital Stay: Payer: Medicare Other | Admitting: Hematology and Oncology

## 2019-11-10 DIAGNOSIS — D649 Anemia, unspecified: Secondary | ICD-10-CM

## 2019-11-10 DIAGNOSIS — A419 Sepsis, unspecified organism: Principal | ICD-10-CM

## 2019-11-10 DIAGNOSIS — R634 Abnormal weight loss: Secondary | ICD-10-CM

## 2019-11-10 DIAGNOSIS — R918 Other nonspecific abnormal finding of lung field: Secondary | ICD-10-CM

## 2019-11-10 DIAGNOSIS — R531 Weakness: Secondary | ICD-10-CM | POA: Diagnosis not present

## 2019-11-10 DIAGNOSIS — Z515 Encounter for palliative care: Secondary | ICD-10-CM | POA: Diagnosis not present

## 2019-11-10 DIAGNOSIS — J189 Pneumonia, unspecified organism: Secondary | ICD-10-CM | POA: Diagnosis not present

## 2019-11-10 DIAGNOSIS — K529 Noninfective gastroenteritis and colitis, unspecified: Secondary | ICD-10-CM | POA: Diagnosis not present

## 2019-11-10 DIAGNOSIS — J690 Pneumonitis due to inhalation of food and vomit: Secondary | ICD-10-CM

## 2019-11-10 DIAGNOSIS — C3411 Malignant neoplasm of upper lobe, right bronchus or lung: Secondary | ICD-10-CM | POA: Diagnosis not present

## 2019-11-10 LAB — CBC
HCT: 28.1 % — ABNORMAL LOW (ref 36.0–46.0)
Hemoglobin: 9.1 g/dL — ABNORMAL LOW (ref 12.0–15.0)
MCH: 27.5 pg (ref 26.0–34.0)
MCHC: 32.4 g/dL (ref 30.0–36.0)
MCV: 84.9 fL (ref 80.0–100.0)
Platelets: 389 10*3/uL (ref 150–400)
RBC: 3.31 MIL/uL — ABNORMAL LOW (ref 3.87–5.11)
RDW: 15.7 % — ABNORMAL HIGH (ref 11.5–15.5)
WBC: 20.1 10*3/uL — ABNORMAL HIGH (ref 4.0–10.5)
nRBC: 0 % (ref 0.0–0.2)

## 2019-11-10 LAB — BASIC METABOLIC PANEL
Anion gap: 6 (ref 5–15)
BUN: 6 mg/dL — ABNORMAL LOW (ref 8–23)
CO2: 24 mmol/L (ref 22–32)
Calcium: 7.5 mg/dL — ABNORMAL LOW (ref 8.9–10.3)
Chloride: 103 mmol/L (ref 98–111)
Creatinine, Ser: 0.54 mg/dL (ref 0.44–1.00)
GFR calc Af Amer: >60 mL/min (ref >60)
GFR calc non Af Amer: >60 mL/min (ref >60)
Glucose, Bld: 104 mg/dL — ABNORMAL HIGH (ref 70–99)
Potassium: 3.7 mmol/L (ref 3.5–5.1)
Sodium: 133 mmol/L — ABNORMAL LOW (ref 135–145)

## 2019-11-10 LAB — PROTIME-INR
INR: 1.3 — ABNORMAL HIGH (ref 0.8–1.2)
Prothrombin Time: 15.2 seconds (ref 11.4–15.2)

## 2019-11-10 LAB — SURGICAL PATHOLOGY

## 2019-11-10 LAB — CYTOLOGY - NON PAP

## 2019-11-10 LAB — PROCALCITONIN: Procalcitonin: 11.32 ng/mL

## 2019-11-10 LAB — HIV ANTIBODY (ROUTINE TESTING W REFLEX): HIV Screen 4th Generation wRfx: NONREACTIVE

## 2019-11-10 LAB — CORTISOL-AM, BLOOD: Cortisol - AM: 11.2 ug/dL (ref 6.7–22.6)

## 2019-11-10 MED ORDER — ENSURE ENLIVE PO LIQD: 237.0000 mL | Freq: Two times a day (BID) | ORAL | Status: DC

## 2019-11-10 MED ORDER — ADULT MULTIVITAMIN W/MINERALS CH
1.0000 | ORAL_TABLET | Freq: Every day | ORAL | Status: DC
  Administered 2019-11-11 – 2019-11-12 (×2): 1 via ORAL
  Filled 2019-11-10 (×2): qty 1

## 2019-11-10 MED ORDER — PIPERACILLIN-TAZOBACTAM 3.375 G IVPB
3.3750 g | Freq: Three times a day (TID) | INTRAVENOUS | Status: DC
  Administered 2019-11-10 – 2019-11-12 (×7): 3.375 g via INTRAVENOUS
  Filled 2019-11-10 (×7): qty 50

## 2019-11-10 NOTE — Progress Notes (Signed)
PT Cancellation Note  Patient Details Name: Anne Hardy MRN: [id number] DOB: 1951/02/24   Cancelled Treatment:    Reason Eval/Treat Not Completed: Other (comment): Upon entering room nursing found assisting pt with hygiene related to frequent loose stools.  Will attempt to see pt at a future date/time as medically appropriate.    Linus Salmons PT, DPT 11/10/19, 10:06 AM

## 2019-11-10 NOTE — Progress Notes (Signed)
PROGRESS NOTE    Anne Hardy  ESP:[id number] DOB: May 02, 1950 DOA: 11/09/2019 PCP: Toni Arthurs, NP   Chief complaint.  Shortness of breath. Brief Narrative:  Patient is a 69 year old female with history of COPD, recent diagnosis of lung cancer, history of stroke and gastroesophageal reflux disease who present to the hospital with shortness of breath, nausea vomiting and diarrhea. Patient has been losing weight recently.  Patient states that she had a massive amount of acid reflux about 4 to 5 weeks ago, with large amount of aspiration and choking.  Since that time, she has been complaining short of breath, which is progressively getting worse.  She was placed on oxygen about 2 to 3 weeks ago.  She also has some clear mucus with cough.  She did not have any fever or chills.  She started have a nausea vomiting diarrhea for the last few days. Upon arriving the emergency room, a chest CT scan showed right lower lobe consolidation and mild colitis in the distal colon.  She was treated with Rocephin, Zithromax and Flagyl.  7/28.  Patient condition is consistent with aspiration pneumonia, she has a profound elevation of procalcitonin level.  Antibiotic switched to Zosyn, Zithromax continued.  Flagyl discontinued.    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  Sepsis. Secondary to right lower lobe pneumonia.  Patient has a profound leukocytosis and elevated procalcitonin level. Due to worsening elevation of procalcitonin, antibiotic was switched to Zosyn and Zithromax. Currently hemodynamically stable, will discontinue IV fluids.  #2.  Right lower lobe pneumonia secondary to aspiration pneumonia. Continue antibiotics as above.  She does not have current dysphagia or choking.  3.  COPD with recent hypoxemic respiratory failure. Continue oxygen  treatment.  4.  Severe protein calorie malnutrition. Patient has been losing weight, she has significant muscle atrophy.  Start a supplement.  5.  Colitis. Continue antibiotics as above.  6.  Lung cancer with metastasis.    DVT prophylaxis: Lovenox Code Status: Full Family Communication: None Disposition Plan:  . Patient came from: Home           . Anticipated d/c place: Home . Barriers to d/c OR conditions which need to be met to effect a safe d/c:   Consultants:   None  Procedures: None Antimicrobials:  Zosyn and Zithromax.  Subjective: Patient still complaining short of breath with exertion, still on oxygen.  Cough, with clear mucus. Still complaining left lower quadrant cramping, with multiple small amount of stools, soft, not watery.  No nausea vomiting.  Tolerating diet. No fever or chills. No dysuria hematuria.  Objective: Vitals:   11/09/19 2100 11/10/19 0543 11/10/19 0550 11/10/19 0755  BP: 114/69  95/73 107/75  Pulse:   81 80  Resp: _0 Temp:  98.2 F (36.8 C)  98.4 F (36.9 C)  TempSrc:  Oral  Oral  SpO2:   98% 98%  Weight:      Height:        Intake/Output Summary (Last 24 hours) at 11/10/2019 1039 Last data filed at 11/10/2019 1017 Gross per 24 hour  Intake 2374.92 ml  Output 400 ml  Net 1974.92 ml   Filed Weights   11/09/19 0144  Weight: (!) 39.9 kg    Examination:  General exam: Appears calm and comfortable.  Appear significant malnourished  with muscle atrophy. Respiratory system: Significant decreased breathing sound on right lower field.Marland Kitchen Respiratory effort normal. Cardiovascular system: S1 & S2 heard, RRR. No JVD, murmurs, rubs, gallops or clicks. No pedal edema. Gastrointestinal system: Abdomen is nondistended, soft and mild LLQ tender. No organomegaly or masses felt. Normal bowel sounds heard. Central nervous system: Alert and oriented. No focal neurological deficits. Extremities: Symmetric  Skin: No rashes, lesions or  ulcers Psychiatry: Judgement and insight appear normal. Mood & affect appropriate.     Data Reviewed: I have personally reviewed following labs and imaging studies  CBC: Recent Labs  Lab 11/09/19 0136 11/10/19 0321  WBC 31.1* 20.1*  NEUTROABS 29.4*  --   HGB 12.7 9.1*  HCT 40.4 28.1*  MCV 86.1 84.9  PLT 545* 616   Basic Metabolic Panel: Recent Labs  Lab 11/09/19 0136 11/10/19 0321  NA 131* 133*  K 3.5 3.7  CL 96* 103  CO2 24 24  GLUCOSE 170* 104*  BUN 14 6*  CREATININE 0.77 0.54  CALCIUM 8.8* 7.5*   GFR: Estimated Creatinine Clearance: 43 mL/min (by C-G formula based on SCr of 0.54 mg/dL). Liver Function Tests: Recent Labs  Lab 11/09/19 0136  AST 12*  ALT 13  ALKPHOS 100  BILITOT 0.9  PROT 6.5  ALBUMIN 3.0*   No results for input(s): LIPASE, AMYLASE in the last 168 hours. No results for input(s): AMMONIA in the last 168 hours. Coagulation Profile: Recent Labs  Lab 11/10/19 0321  INR 1.3*   Cardiac Enzymes: No results for input(s): CKTOTAL, CKMB, CKMBINDEX, TROPONINI in the last 168 hours. BNP (last 3 results) No results for input(s): PROBNP in the last 8760 hours. HbA1C: No results for input(s): HGBA1C in the last 72 hours. CBG: No results for input(s): GLUCAP in the last 168 hours. Lipid Profile: No results for input(s): CHOL, HDL, LDLCALC, TRIG, CHOLHDL, LDLDIRECT in the last 72 hours. Thyroid Function Tests: No results for input(s): TSH, T4TOTAL, FREET4, T3FREE, THYROIDAB in the last 72 hours. Anemia Panel: No results for input(s): VITAMINB12, FOLATE, FERRITIN, TIBC, IRON, RETICCTPCT in the last 72 hours. Sepsis Labs: Recent Labs  Lab 11/09/19 0136 11/10/19 0321  PROCALCITON 1.57 11.32  LATICACIDVEN 1.9  --     Recent Results (from the past 240 hour(s))  SARS CORONAVIRUS 2 (TAT 6-24 HRS) Nasopharyngeal Nasopharyngeal Swab     Status: None   Collection Time: 11/02/19  1:38 PM   Specimen: Nasopharyngeal Swab  Result Value Ref Range  Status   SARS Coronavirus 2 NEGATIVE NEGATIVE Final    Comment: (NOTE) SARS-CoV-2 target nucleic acids are NOT DETECTED.  The SARS-CoV-2 RNA is generally detectable in upper and lower respiratory specimens during the acute phase of infection. Negative results do not preclude SARS-CoV-2 infection, do not rule out co-infections with other pathogens, and should not be used as the sole basis for treatment or other patient management decisions. Negative results must be combined with clinical observations, patient history, and epidemiological information. The expected result is Negative.  Fact Sheet for Patients: SugarRoll.be  Fact Sheet for Healthcare Providers: https://www.woods-mathews.com/  This test is not yet approved or cleared by the [place] FDA and  has been authorized for detection and/or diagnosis of SARS-CoV-2 by FDA under an Emergency Use Authorization (EUA). This EUA will remain  in effect (meaning this test can be used) for the duration of the COVID-19 declaration under Se ction 564(b)(1) of the Act, 21 U.S.C. section 360bbb-3(b)(1), unless the authorization is terminated or revoked sooner.  Performed at [facility], [place] [address], [place], [place] [address]   Culture, blood (routine x 2)     Status: None (Preliminary result)   Collection Time: 11/09/19  1:58 AM   Specimen: BLOOD  Result Value Ref Range Status   Specimen Description BLOOD BLOOD LEFT FOREARM  Final   Special Requests   Final    BOTTLES DRAWN AEROBIC AND ANAEROBIC Blood Culture adequate volume   Culture   Final    NO GROWTH 1 DAY Performed at [facility], [address] [place], [place] [address]    Report Status PENDING  Incomplete  Culture, blood (routine x 2)     Status: None (Preliminary result)   Collection Time: 11/09/19  1:58 AM   Specimen: BLOOD  Result Value Ref Range Status   Specimen Description BLOOD BLOOD RIGHT FOREARM   Final   Special Requests   Final    BOTTLES DRAWN AEROBIC AND ANAEROBIC Blood Culture adequate volume   Culture   Final    NO GROWTH 1 DAY Performed at [facility], [address], [place], [place] [address]    Report Status PENDING  Incomplete  SARS Coronavirus 2 by RT PCR (hospital order, performed in [place] hospital lab) Nasopharyngeal Nasopharyngeal Swab     Status: None   Collection Time: 11/09/19  1:58 AM   Specimen: Nasopharyngeal Swab  Result Value Ref Range Status   SARS Coronavirus 2 NEGATIVE NEGATIVE Final    Comment: (NOTE) SARS-CoV-2 target nucleic acids are NOT DETECTED.  The SARS-CoV-2 RNA is generally detectable in upper and lower respiratory specimens during the acute phase of infection. The lowest concentration of SARS-CoV-2 viral copies this assay can detect is 250 copies / mL. A negative result does not preclude SARS-CoV-2 infection and should not be used as the sole basis for treatment or other patient management decisions.  A negative result may occur with improper specimen collection / handling, submission of specimen other than nasopharyngeal swab, presence of viral mutation(s) within the areas targeted by this assay, and inadequate number of viral copies (<250 copies / mL). A negative result must be combined with clinical observations, patient history, and epidemiological information.  Fact Sheet for Patients:   StrictlyIdeas.no  Fact Sheet for Healthcare Providers: BankingDealers.co.za  This test is not yet approved or  cleared by the [place] FDA and has been authorized for detection and/or diagnosis of SARS-CoV-2 by FDA under an Emergency Use Authorization (EUA).  This EUA will remain in effect (meaning this test can be used) for the duration of the COVID-19 declaration under Section 564(b)(1) of the Act, 21 U.S.C. section 360bbb-3(b)(1), unless the authorization is terminated  or revoked sooner.  Performed at [facility], [address], [place], [place] [address]          Radiology Studies: MR BRAIN W WO CONTRAST  Result Date: 11/09/2019 CLINICAL DATA:  69 year old female with recently diagnosed lung cancer. Staging. EXAM: MRI HEAD WITHOUT AND WITH CONTRAST TECHNIQUE: Multiplanar, multiecho pulse sequences of the brain and surrounding structures were obtained without and with intravenous contrast. CONTRAST:  23m GADAVIST GADOBUTROL 1 MMOL/ML IV SOLN COMPARISON:  PET-CT 10/27/2019, brain MRI 06/22/2019. FINDINGS: Brain: Study is intermittently degraded by motion artifact despite repeated imaging attempts. Postcontrast enhancement of the brain appears stable since March and within normal limits. No abnormal intracranial enhancement or dural thickening identified. No midline shift, mass effect, or evidence of intracranial mass lesion. No restricted diffusion to  suggest acute infarction. No ventriculomegaly, extra-axial collection or acute intracranial hemorrhage. Cervicomedullary junction and pituitary are within normal limits. Pearline Cables and white matter signal appears stable since March, with a small area of post ischemic appearing encephalomalacia in the left centrum semiovale and corona radiata. There is chronic superficial siderosis over both superior hemispheres greater on the right, better demonstrated on March SWI due to motion artifact today. There is also some left occipital lobe involvement. Vascular: Major intracranial vascular flow voids are stable. The major dural venous sinuses are enhancing and appear to be patent. Skull and upper cervical spine: Stable, negative. Sinuses/Orbits: Stable, negative. Other: Mastoids remain clear. Grossly normal visible internal auditory structures. Scalp and face soft tissues appear negative. IMPRESSION: 1. Intermittently motion degraded exam today but overall stable MRI appearance of the brain since March. No metastatic  disease or acute intracranial abnormality identified. 2. 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 Chronic small vessel ischemia suspected in the left hemisphere white matter. Electronically Signed   By: Genevie Ann M.D.   On: 11/09/2019 22:59   CT Abdomen Pelvis W Contrast  Result Date: 11/09/2019 CLINICAL DATA:  History of lung carcinoma with abdominal pain, initial encounter EXAM: CT ABDOMEN AND PELVIS WITH CONTRAST TECHNIQUE: Multidetector CT imaging of the abdomen and pelvis was performed using the standard protocol following bolus administration of intravenous contrast. CONTRAST:  26m OMNIPAQUE IOHEXOL 300 MG/ML  SOLN COMPARISON:  10/27/2019 PET-CT FINDINGS: Lower chest: Consolidation in the right lower lobe is noted. Small right pleural effusion is noted. The left lung is clear. On delayed images there is a focal soft tissue lesion in the right hilum which corresponds to areas of increased activity on recent PET-CT consistent with lymphadenopathy. Hepatobiliary: No focal liver abnormality is seen. No gallstones, gallbladder wall thickening, or biliary dilatation. No focal mass lesion is noted to correspond with the area of increased metabolic activity on recent PET-CT. Pancreas: Unremarkable. No pancreatic ductal dilatation or surrounding inflammatory changes. Spleen: Normal in size without focal abnormality. Adrenals/Urinary Tract: Adrenal glands are within normal limits. Kidneys are well visualized bilaterally within normal enhancement pattern. Delayed images demonstrate normal excretion of contrast material. The bladder is within normal limits. Stomach/Bowel: Appendix is not well visualized. The distal colon is decompressed with mild inflammatory change identified suggestive of mild colitis. These changes are new from the prior PET-CT. No small bowel abnormality is noted. The stomach is within normal limits. Vascular/Lymphatic: Aortic calcifications are seen. No significant  lymphadenopathy is noted. Reproductive: Small calcified uterine fibroids are seen. Other: No abdominal wall hernia or abnormality. No abdominopelvic ascites. Musculoskeletal: Right hip fixation is noted. No acute bony abnormality is seen. Changes of prior vertebral augmentation are noted at T12. T10 compression deformity is noted. Mild L4 compression deformity is noted as well. IMPRESSION: Right lower lobe consolidation as well as lymphadenopathy in the right hilar region similar to that seen on prior PET-CT. Changes of mild colitis in the distal colon. Chronic compression deformities. Electronically Signed   By: MInez CatalinaM.D.   On: 11/09/2019 03:59   DG Chest Port 1 View  Result Date: 11/09/2019 CLINICAL DATA:  Weakness EXAM: PORTABLE CHEST 1 VIEW COMPARISON:  November 05, 2019 FINDINGS: The heart size and mediastinal contours are unchanged. There appears to be a right suprahilar ill-defined mass/airspace opacity as on the prior exam. There is elevation of the right hemidiaphragm with subsegmental atelectasis. There is a small right pleural effusion present. The left lung is clear. No acute  osseous abnormality. IMPRESSION: Right suprahilar mass with surrounding airspace opacities, not significantly changed since prior exam. Probable small right pleural effusion. Electronically Signed   By: Prudencio Pair M.D.   On: 11/09/2019 02:06        Scheduled Meds: . enoxaparin (LOVENOX) injection  30 mg Subcutaneous Q24H  . famotidine  40 mg Oral QPM  . gabapentin  100 mg Oral TID  . ipratropium-albuterol  3 mL Nebulization TID  . traZODone  50 mg Oral QHS   Continuous Infusions: . azithromycin 500 mg (11/10/19 0510)  . piperacillin-tazobactam 3.375 g (11/10/19 0749)     LOS: 1 day    Time spent: 35 minutes    Sharen Hones, MD [facility]   To contact the attending provider between 7A-7P or the covering provider during after hours 7P-7A, please log into the web site [email or web address] and  access using universal [place] password for that web site. If you do not have the password, please call the hospital operator.  11/10/2019, 10:39 AM

## 2019-11-10 NOTE — Progress Notes (Signed)
OT Cancellation Note  Patient Details Name: Anne Hardy MRN: [id number] DOB: May 20, 1950   Cancelled Treatment:    Reason Eval/Treat Not Completed: Medical issues which prohibited therapy. Thank you for the OT consult. Order received and chart reviewed. Spoke with physical therapist who indicated pt having frequent loose stools, and is unable to participate in mobility at this time. Will hold and initiate services as available and pt medically appropriate for therapy evaluation.   Shara Blazing, M.S., OTR/L Ascom: [phone] 11/10/19, 10:29 AM

## 2019-11-10 NOTE — Consult Note (Signed)
[facility]  Date of admission:  11/09/2019  Inpatient day:  11/10/2019  Consulting physician: Dr Max Sane   Reason for Consultation:  Lung mass  Chief Complaint: Anne Hardy is a 69 y.o. female with a right lung mass and mediastinal adenopathy who was admitted through the emergency room with generalized wekaness, pneumonia and colitis.  HPI:  The patient has a > 35 pack year smoking history.  She presented with cough, shortness of breath, decreased appetite and weight loss.  CXR suggested a mass.  Chest CT with contrast on 10/07/2019 revealed a rounded 4.0 x 3.9 cm suprahilar mass of the posterior right upper lobe. There was associated soft tissue and or mucous obstruction/impaction of the right mainstem bronchus and the right upper lobe bronchi. There was bulky pretracheal, right hilar, and subcarinal lymphadenopathy. The largest pretracheal node or conglomerate node measured 4.1 x 3.3 cm.  Findings were consistent with primary lung malignancy with nodal metastatic disease. There was emphysema, trace bilateral pleural effusions, and trace pericardial effusion.  PET scan on 10/27/2019 revealed previously demonstrated right upper lobe lung mass and the confluent right hilar and mediastinal adenopathy were hypermetabolic c/w bronchogenic carcinoma. Central necrosis suggested squamous cell carcinoma. There were no definite distant metastases. Peripheral metabolic activity in the inferior aspect of the right hepatic lobe is relatively linear in orientation and without clear corresponding finding on the CT images. There were stable compression deformities at T10, T11 and T12.  Bronchoscopy with endoscopic ultrasound (EUS) on 11/05/2019 by Dr. Lanney Gins revealed exophytic lesion in the right mainstem bronchus with friable mucosa.  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 Biopsies of lymph node stations 7, 4L, and 10L (subcarinal, low left  paratracheal, and left hilar) were positive for malignancy.  She presented to the [facility] ER with several day history of generalized weakness with poor oral intake.  She noted LLQ abdominal pain, nausea, vomiting, and diarrhea.  She denied fever and chills.  She had a chronic cough.  Initial WBC 31,000.  Abdomen and pelvis CT revealed right lower lobe consolidation and mild distal colitis.  She was started on vancomycin and Zosyn.  Antibiotics were switched to Zosyn and Zithromax.  Head MRI on 11/09/2019 revealed an intermittently motion degraded exam, but overall stable MRI since 06/2019.  There was no metastatic disease or acute intracranial abnormality.  Symptomatically, she is fatigued.  She has chronic shortness of breath and cough.  She notes minimal diarrhea.  She denies any blood or mucus.   Past Medical History:  Diagnosis Date  . Arthritis   . Cervical disc disease   . COPD (chronic obstructive pulmonary disease) ([place])   . Dyspnea   . GERD (gastroesophageal reflux disease)   . Headache    severe headaches  . Stroke [facility]) 06/2018   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-no residual effedcts-this happened in [place]-Pt just moved to [place] 2 weeks ago (October 2020)    Past Surgical History:  Procedure Laterality Date  . COLONOSCOPY WITH ESOPHAGOGASTRODUODENOSCOPY (EGD)    . femur fx repair    . FRACTURE SURGERY  2018   femur fx  . KYPHOPLASTY N/A 01/28/2019   Procedure: T12 KYPHOPLASTY;  Surgeon: Hessie Knows, MD;  Location: [facility] ORS;  Service: Orthopedics;  Laterality: N/A;  . OOPHORECTOMY Left   . VIDEO BRONCHOSCOPY WITH ENDOBRONCHIAL NAVIGATION N/A 11/05/2019   Procedure: VIDEO BRONCHOSCOPY WITH ENDOBRONCHIAL NAVIGATION;  Surgeon: Ottie Glazier, MD;  Location: [facility] ORS;  Service: Thoracic;  Laterality: N/A;  . VIDEO BRONCHOSCOPY WITH ENDOBRONCHIAL ULTRASOUND N/A 11/05/2019   Procedure: VIDEO BRONCHOSCOPY WITH ENDOBRONCHIAL ULTRASOUND;  Surgeon: Ottie Glazier,  MD;  Location: [facility] ORS;  Service: Thoracic;  Laterality: N/A;    History reviewed. No pertinent family history.  Social History:  reports that she has been smoking cigarettes. She has smoked for the past 50.00 years. She has never used smokeless tobacco. She reports current alcohol use. She reports that she does not use drugs.  She has smoked for the past 50+ years. She previously smoked 3/4 ppd.  She has recently been smoking 5-6 cigarettes/day.  She drinks one drink per night (Kahlua and cream). The patient grew up in [place] and then moved to [place], [place] for 40 years. She moved to [place] in 12/2018 to live with her sister who also acts as her caregiver. 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 The patient is alone today.  Allergies:  Allergies  Allergen Reactions  . Lactose Intolerance (Gi)     Medications Prior to Admission  Medication Sig Dispense Refill  . acetaminophen (TYLENOL) 500 MG tablet Take 1,000 mg by mouth 3 (three) times daily as needed (pain.).    Marland Kitchen acetaminophen-codeine (TYLENOL #3) 300-30 MG tablet Take 1 tablet by mouth 2 (two) times daily as needed for moderate pain.    Marland Kitchen albuterol (VENTOLIN HFA) 108 (90 Base) MCG/ACT inhaler Inhale 1-2 puffs into the lungs every 4 (four) hours as needed for wheezing or shortness of breath.    Marland Kitchen alendronate (FOSAMAX) 70 MG tablet Take 70 mg by mouth every Tuesday.     Marland Kitchen amoxicillin-clavulanate (AUGMENTIN) 875-125 MG tablet Take 1 tablet by mouth every 12 (twelve) hours.    Marland Kitchen BREO ELLIPTA 100-25 MCG/INH AEPB Inhale 1 puff into the lungs daily as needed (asthma).     . cyclobenzaprine (FLEXERIL) 5 MG tablet Take 1 tablet (5 mg total) by mouth 2 (two) times daily as needed for muscle spasms. 30 tablet 0  . cyclobenzaprine (FLEXERIL) 5 MG tablet Take 5 mg by mouth at bedtime.    . famotidine (PEPCID) 40 MG tablet Take 40 mg by mouth every evening.    . gabapentin (NEURONTIN) 100 MG capsule  Take 100 mg by mouth 3 (three) times daily.     Marland Kitchen HYDROcodone-acetaminophen (NORCO/VICODIN) 5-325 MG tablet Take 1 tablet by mouth every 6 (six) hours as needed (pain). 15 tablet 0  . ipratropium-albuterol (DUONEB) 0.5-2.5 (3) MG/3ML SOLN Take 3 mLs by nebulization in the morning, at noon, and at bedtime.    . Probiotic Product (PROBIOTIC DAILY PO) Take 1 capsule by mouth daily.    . traZODone (DESYREL) 50 MG tablet Take 50 mg by mouth at bedtime.    . Ascorbic Acid (VITAMIN C) 1000 MG tablet Take 1,000 mg by mouth daily.     . B Complex-C (B-COMPLEX WITH VITAMIN C) tablet Take 1 tablet by mouth daily.     . Calcium Carb-Cholecalciferol (CALCIUM 500 + D3 PO) Take 1 tablet by mouth daily.     . Multiple Vitamin (MULTIVITAMIN WITH MINERALS) TABS tablet Take 1 tablet by mouth daily.      Review of Systems: GENERAL:  General fatigue.  No fevers, sweat.  Weight loss. PERFORMANCE STATUS (ECOG):  1-2 HEENT:  No visual changes, runny nose, sore throat, mouth sores or tenderness. Lungs:  Shortness of breath.  Chronic cough.  No hemoptysis. Cardiac:  No chest pain, palpitations, orthopnea, or PND. GI:  Decreased appetite.  LLQ abdominal discomfort.  Nausea, vomiting has resolved.  Small volume diarrhea.  No melena or hematochezia. GU:  No urgency, frequency, dysuria, or hematuria. Musculoskeletal:  Right shoulder and neck pain (chronic). Extremities:  No pain or swelling. Skin:  No rashes or skin changes. Neuro:  General weakness.  No headache, numbness or weakness, balance or coordination issues. Endocrine:  No diabetes, thyroid issues, hot flashes or night sweats. Psych:  No mood changes. Pain:  No focal pain. Review of systems:  All other systems reviewed and found to be negative.  Physical Exam:  Blood pressure 108/81, pulse 94, temperature 98.3 F (36.8 C), resp. rate 18, height 5\' 3"  (1.6 m), weight (!) 88 lb (39.9 kg), SpO2 93 %.  GENERAL:  Thin, chronically fatigued appearing woman  sitting comfortably on the medical unit in no acute distress.  MENTAL STATUS:  Alert and oriented to person, place and time. HEAD:  Short gray hair.  Normocephalic, atraumatic, face symmetric, no Cushingoid features. EYES:  Blue eyes.  No conjunctivitis or scleral icterus. PSYCH:  Appropriate. Additional exam not performed as patient was on bedside commode and had soiled clothes at end of initial meeting.   Results for orders placed or performed during the hospital encounter of 11/09/19 (from the past 48 hour(s))  Lactic acid, plasma     Status: None   Collection Time: 11/09/19  1:36 AM  Result Value Ref Range   Lactic Acid, Venous 1.9 0.5 - 1.9 mmol/L    Comment: Performed at [facility], [address] [place], [place] [address]  CBC with Differential     Status: Abnormal   Collection Time: 11/09/19  1:36 AM  Result Value Ref Range   WBC 31.1 (H) 4.0 - 10.5 K/uL   RBC 4.69 3.87 - 5.11 MIL/uL   Hemoglobin 12.7 12.0 - 15.0 g/dL   HCT 40.4 36 - 46 %   MCV 86.1 80.0 - 100.0 fL   MCH 27.1 26.0 - 34.0 pg   MCHC 31.4 30.0 - 36.0 g/dL   RDW 15.7 (H) 11.5 - 15.5 %   Platelets 545 (H) 150 - 400 K/uL   nRBC 0.0 0.0 - 0.2 %   Neutrophils Relative % 94 %   Neutro Abs 29.4 (H) 1.7 - 7.7 K/uL   Lymphocytes Relative 2 %   Lymphs Abs 0.5 (L) 0.7 - 4.0 K/uL   Monocytes Relative 3 %   Monocytes Absolute 0.9 0 - 1 K/uL   Eosinophils Relative 0 %   Eosinophils Absolute 0.0 0 - 0 K/uL   Basophils Relative 0 %   Basophils Absolute 0.1 0 - 0 K/uL   Immature Granulocytes 1 %   Abs Immature Granulocytes 0.24 (H) 0.00 - 0.07 K/uL    Comment: Performed at [facility], [address] [place],  [address]  Comprehensive metabolic panel     Status: Abnormal   Collection Time: 11/09/19  1:36 AM  Result Value Ref Range   Sodium 131 (L) 135 - 145 mmol/L   Potassium 3.5 3.5 - 5.1 mmol/L   Chloride 96 (L) 98 - 111 mmol/L   CO2 24 22 - 32 mmol/L   Glucose, Bld 170 (H) 70 -  99 mg/dL    Comment: Glucose reference range applies only to samples taken after fasting for at least 8 hours.   BUN 14 8 - 23 mg/dL   Creatinine, Ser 0.77 0.44 -  1.00 mg/dL   Calcium 8.8 (L) 8.9 - 10.3 mg/dL   Total Protein 6.5 6.5 - 8.1 g/dL   Albumin 3.0 (L) 3.5 - 5.0 g/dL   AST 12 (L) 15 - 41 U/L   ALT 13 0 - 44 U/L   Alkaline Phosphatase 100 38 - 126 U/L   Total Bilirubin 0.9 0.3 - 1.2 mg/dL   GFR calc non Af Amer >60 >60 mL/min   GFR calc Af Amer >60 >60 mL/min   Anion gap 11 5 - 15    Comment: Performed at [facility], [place], [place] [address]  Troponin I (High Sensitivity)     Status: None   Collection Time: 11/09/19  1:36 AM  Result Value Ref Range   Troponin I (High Sensitivity) 8 <18 ng/L    Comment: (NOTE) Elevated high sensitivity troponin I (hsTnI) values and significant  changes across serial measurements may suggest ACS but many other  chronic and acute conditions are known to elevate hsTnI results.  Refer to the "Links" section for chest pain algorithms and additional  guidance. Performed at [facility], [address] [place], [place] [address]   Procalcitonin - Baseline     Status: None   Collection Time: 11/09/19  1:36 AM  Result Value Ref Range   Procalcitonin 1.57 ng/mL    Comment:        Interpretation: PCT > 0.5 ng/mL and <= 2 ng/mL: Systemic infection (sepsis) is possible, but other conditions are known to elevate PCT as well. (NOTE)       Sepsis PCT Algorithm           Lower Respiratory Tract                                      Infection PCT Algorithm    ----------------------------     ----------------------------         PCT < 0.25 ng/mL                PCT < 0.10 ng/mL          Strongly encourage             Strongly discourage   discontinuation of antibiotics    initiation of antibiotics    ----------------------------     -----------------------------       PCT 0.25 - 0.50 ng/mL            PCT 0.10  - 0.25 ng/mL               OR       >80% decrease in PCT            Discourage initiation of                                            antibiotics      Encourage discontinuation           of antibiotics    ----------------------------     -----------------------------         PCT >= 0.50 ng/mL              PCT 0.26 - 0.50 ng/mL                AND       <  80% decrease in PCT             Encourage initiation of                                             antibiotics       Encourage continuation           of antibiotics    ----------------------------     -----------------------------        PCT >= 0.50 ng/mL                  PCT > 0.50 ng/mL               AND         increase in PCT                  Strongly encourage                                      initiation of antibiotics    Strongly encourage escalation           of antibiotics                                     -----------------------------                                           PCT <= 0.25 ng/mL                                                 OR                                        > 80% decrease in PCT                                      Discontinue / Do not initiate                                             antibiotics  Performed at [facility], [address] [place], [place] [address]   Culture, blood (routine x 2)     Status: None (Preliminary result)   Collection Time: 11/09/19  1:58 AM   Specimen: BLOOD  Result Value Ref Range   Specimen Description BLOOD BLOOD LEFT FOREARM    Special Requests      BOTTLES DRAWN AEROBIC AND ANAEROBIC Blood Culture adequate volume   Culture      NO GROWTH 1 DAY Performed at [facility], [address] [place], [place] [address]    Report Status PENDING   Culture, blood (routine x 2)  Status: None (Preliminary result)   Collection Time: 11/09/19  1:58 AM   Specimen: BLOOD  Result Value Ref Range   Specimen Description BLOOD BLOOD RIGHT  FOREARM    Special Requests      BOTTLES DRAWN AEROBIC AND ANAEROBIC Blood Culture adequate volume   Culture      NO GROWTH 1 DAY Performed at [facility], [address], [place], [place] [address]    Report Status PENDING   SARS Coronavirus 2 by RT PCR (hospital order, performed in [facility] hospital lab) Nasopharyngeal Nasopharyngeal Swab     Status: None   Collection Time: 11/09/19  1:58 AM   Specimen: Nasopharyngeal Swab  Result Value Ref Range   SARS Coronavirus 2 NEGATIVE NEGATIVE    Comment: (NOTE) SARS-CoV-2 target nucleic acids are NOT DETECTED.  The SARS-CoV-2 RNA is generally detectable in upper and lower respiratory specimens during the acute phase of infection. The lowest concentration of SARS-CoV-2 viral copies this assay can detect is 250 copies / mL. A negative result does not preclude SARS-CoV-2 infection and should not be used as the sole basis for treatment or other patient management decisions.  A negative result may occur with improper specimen collection / handling, submission of specimen other than nasopharyngeal swab, presence of viral mutation(s) within the areas targeted by this assay, and inadequate number of viral copies (<250 copies / mL). A negative result must be combined with clinical observations, patient history, and epidemiological information.  Fact Sheet for Patients:   StrictlyIdeas.no  Fact Sheet for Healthcare Providers: BankingDealers.co.za  This test is not yet approved or  cleared by the [place] FDA and has been authorized for detection and/or diagnosis of SARS-CoV-2 by FDA under an Emergency Use Authorization (EUA).  This EUA will remain in effect (meaning this test can be used) for the duration of the COVID-19 declaration under Section 564(b)(1) of the Act, 21 U.S.C. section 360bbb-3(b)(1), unless the authorization is terminated or revoked sooner.  Performed at  [facility], [place], [place] [address]   Troponin I (High Sensitivity)     Status: None   Collection Time: 11/09/19  5:41 AM  Result Value Ref Range   Troponin I (High Sensitivity) 10 <18 ng/L    Comment: (NOTE) Elevated high sensitivity troponin I (hsTnI) values and significant  changes across serial measurements may suggest ACS but many other  chronic and acute conditions are known to elevate hsTnI results.  Refer to the "Links" section for chest pain algorithms and additional  guidance. Performed at [facility], [address] [place], [place] [address]   Urinalysis, Complete w Microscopic     Status: Abnormal   Collection Time: 11/09/19  1:52 PM  Result Value Ref Range   Color, Urine YELLOW (A) YELLOW   APPearance CLEAR (A) CLEAR   Specific Gravity, Urine 1.024 1.005 - 1.030   pH 5.0 5.0 - 8.0   Glucose, UA NEGATIVE NEGATIVE mg/dL   Hgb urine dipstick SMALL (A) NEGATIVE   Bilirubin Urine NEGATIVE NEGATIVE   Ketones, ur NEGATIVE NEGATIVE mg/dL   Protein, ur NEGATIVE NEGATIVE mg/dL   Nitrite NEGATIVE NEGATIVE   Leukocytes,Ua NEGATIVE NEGATIVE   RBC / HPF 0-5 0 - 5 RBC/hpf   WBC, UA 0-5 0 - 5 WBC/hpf   Bacteria, UA NONE SEEN NONE SEEN   Squamous Epithelial / LPF 0-5 0 - 5    Comment: Performed at [facility] [place],  [place] [address]  HIV Antibody (routine testing w rflx)     Status: None   Collection Time: 11/10/19  3:21 AM  Result Value Ref Range   HIV Screen 4th Generation wRfx Non Reactive Non Reactive    Comment: Performed at [facility], [place] [address] [place], [place] [address]  Protime-INR     Status: Abnormal   Collection Time: 11/10/19  3:21 AM  Result Value Ref Range   Prothrombin Time 15.2 11.4 - 15.2 seconds   INR 1.3 (H) 0.8 - 1.2    Comment: (NOTE) INR goal varies based on device and disease states. Performed at [facility], [address] [place], [place]  [address]   Cortisol-am, blood     Status: None   Collection Time: 11/10/19  3:21 AM  Result Value Ref Range   Cortisol - AM 11.2 6.7 - 22.6 ug/dL    Comment: Performed at [facility], [place] [address] [place], [place] [address]  Procalcitonin     Status: None   Collection Time: 11/10/19  3:21 AM  Result Value Ref Range   Procalcitonin 11.32 ng/mL    Comment:        Interpretation: PCT >= 10 ng/mL: Important systemic inflammatory response, almost exclusively due to severe bacterial sepsis or septic shock. (NOTE)       Sepsis PCT Algorithm           Lower Respiratory Tract                                      Infection PCT Algorithm    ----------------------------     ----------------------------         PCT < 0.25 ng/mL                PCT < 0.10 ng/mL          Strongly encourage             Strongly discourage   discontinuation of antibiotics    initiation of antibiotics    ----------------------------     -----------------------------       PCT 0.25 - 0.50 ng/mL            PCT 0.10 - 0.25 ng/mL               OR       >80% decrease in PCT            Discourage initiation of                                            antibiotics      Encourage discontinuation           of antibiotics    ----------------------------     -----------------------------         PCT >= 0.50 ng/mL              PCT 0.26 - 0.50 ng/mL                AND       <80% decrease in PCT             Encourage initiation of  antibiotics       Encourage continuation           of antibiotics    ----------------------------     -----------------------------        PCT >= 0.50 ng/mL                  PCT > 0.50 ng/mL               AND         increase in PCT                  Strongly encourage                                      initiation of antibiotics    Strongly encourage escalation           of antibiotics                                      -----------------------------                                           PCT <= 0.25 ng/mL                                                 OR                                        > 80% decrease in PCT                                      Discontinue / Do not initiate                                             antibiotics  Performed at [facility], [address] [place], [place] [address]   CBC     Status: Abnormal   Collection Time: 11/10/19  3:21 AM  Result Value Ref Range   WBC 20.1 (H) 4.0 - 10.5 K/uL   RBC 3.31 (L) 3.87 - 5.11 MIL/uL   Hemoglobin 9.1 (L) 12.0 - 15.0 g/dL   HCT 28.1 (L) 36 - 46 %   MCV 84.9 80.0 - 100.0 fL   MCH 27.5 26.0 - 34.0 pg   MCHC 32.4 30.0 - 36.0 g/dL   RDW 15.7 (H) 11.5 - 15.5 %   Platelets 389 150 - 400 K/uL   nRBC 0.0 0.0 - 0.2 %    Comment: Performed at [facility], [address], [place], [place] [address]  Basic metabolic panel     Status: Abnormal   Collection Time: 11/10/19  3:21 AM  Result Value Ref Range   Sodium 133 (L) 135 - 145 mmol/L   Potassium  3.7 3.5 - 5.1 mmol/L   Chloride 103 98 - 111 mmol/L   CO2 24 22 - 32 mmol/L   Glucose, Bld 104 (H) 70 - 99 mg/dL    Comment: Glucose reference range applies only to samples taken after fasting for at least 8 hours.   BUN 6 (L) 8 - 23 mg/dL   Creatinine, Ser 0.54 0.44 - 1.00 mg/dL   Calcium 7.5 (L) 8.9 - 10.3 mg/dL   GFR calc non Af Amer >60 >60 mL/min   GFR calc Af Amer >60 >60 mL/min   Anion gap 6 5 - 15    Comment: Performed at [facility], [address], [place], [place] [address]   MR BRAIN W WO CONTRAST  Result Date: 11/09/2019 CLINICAL DATA:  69 year old female with recently diagnosed lung cancer. Staging. EXAM: MRI HEAD WITHOUT AND WITH CONTRAST TECHNIQUE: Multiplanar, multiecho pulse sequences of the brain and surrounding structures were obtained without and with intravenous contrast. CONTRAST:  78mL GADAVIST GADOBUTROL 1 MMOL/ML IV SOLN  COMPARISON:  PET-CT 10/27/2019, brain MRI 06/22/2019. FINDINGS: Brain: Study is intermittently degraded by motion artifact despite repeated imaging attempts. Postcontrast enhancement of the brain appears stable since March and within normal limits. No abnormal intracranial enhancement or dural thickening identified. No midline shift, mass effect, or evidence of intracranial mass lesion. No restricted diffusion to suggest acute infarction. No ventriculomegaly, extra-axial collection or acute intracranial hemorrhage. Cervicomedullary junction and pituitary are within normal limits. Pearline Cables and white matter signal appears stable since March, with a small area of post ischemic appearing encephalomalacia in the left centrum semiovale and corona radiata. There is chronic superficial siderosis over both superior hemispheres greater on the right, better demonstrated on March SWI due to motion artifact today. There is also some left occipital lobe involvement. Vascular: Major intracranial vascular flow voids are stable. The major dural venous sinuses are enhancing and appear to be patent. Skull and upper cervical spine: Stable, negative. Sinuses/Orbits: Stable, negative. Other: Mastoids remain clear. Grossly normal visible internal auditory structures. Scalp and face soft tissues appear negative. IMPRESSION: 1. Intermittently motion degraded exam today but overall stable MRI appearance of the brain since March. No metastatic disease or acute intracranial abnormality identified. 2. 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 Chronic small vessel ischemia suspected in the left hemisphere white matter. Electronically Signed   By: Genevie Ann M.D.   On: 11/09/2019 22:59   CT Abdomen Pelvis W Contrast  Result Date: 11/09/2019 CLINICAL DATA:  History of lung carcinoma with abdominal pain, initial encounter EXAM: CT ABDOMEN AND PELVIS WITH CONTRAST TECHNIQUE: Multidetector CT imaging of the abdomen and  pelvis was performed using the standard protocol following bolus administration of intravenous contrast. CONTRAST:  75mL OMNIPAQUE IOHEXOL 300 MG/ML  SOLN COMPARISON:  10/27/2019 PET-CT FINDINGS: Lower chest: Consolidation in the right lower lobe is noted. Small right pleural effusion is noted. The left lung is clear. On delayed images there is a focal soft tissue lesion in the right hilum which corresponds to areas of increased activity on recent PET-CT consistent with lymphadenopathy. Hepatobiliary: No focal liver abnormality is seen. No gallstones, gallbladder wall thickening, or biliary dilatation. No focal mass lesion is noted to correspond with the area of increased metabolic activity on recent PET-CT. Pancreas: Unremarkable. No pancreatic ductal dilatation or surrounding inflammatory changes. Spleen: Normal in size without focal abnormality. Adrenals/Urinary Tract: Adrenal glands are within normal limits. Kidneys are well visualized bilaterally within normal enhancement pattern. Delayed images demonstrate  normal excretion of contrast material. The bladder is within normal limits. Stomach/Bowel: Appendix is not well visualized. The distal colon is decompressed with mild inflammatory change identified suggestive of mild colitis. These changes are new from the prior PET-CT. No small bowel abnormality is noted. The stomach is within normal limits. Vascular/Lymphatic: Aortic calcifications are seen. No significant lymphadenopathy is noted. Reproductive: Small calcified uterine fibroids are seen. Other: No abdominal wall hernia or abnormality. No abdominopelvic ascites. Musculoskeletal: Right hip fixation is noted. No acute bony abnormality is seen. Changes of prior vertebral augmentation are noted at T12. T10 compression deformity is noted. Mild L4 compression deformity is noted as well. IMPRESSION: Right lower lobe consolidation as well as lymphadenopathy in the right hilar region similar to that seen on prior  PET-CT. Changes of mild colitis in the distal colon. Chronic compression deformities. Electronically Signed   By: Inez Catalina M.D.   On: 11/09/2019 03:59   DG Chest Port 1 View  Result Date: 11/09/2019 CLINICAL DATA:  Weakness EXAM: PORTABLE CHEST 1 VIEW COMPARISON:  November 05, 2019 FINDINGS: The heart size and mediastinal contours are unchanged. There appears to be a right suprahilar ill-defined mass/airspace opacity as on the prior exam. There is elevation of the right hemidiaphragm with subsegmental atelectasis. There is a small right pleural effusion present. The left lung is clear. No acute osseous abnormality. IMPRESSION: Right suprahilar mass with surrounding airspace opacities, not significantly changed since prior exam. Probable small right pleural effusion. Electronically Signed   By: Prudencio Pair M.D.   On: 11/09/2019 02:06    Assessment:  The patient is a 69 y.o. 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  She was noted to have an exophytic lesion in the right mainstem bronchus with friable mucosa.  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 Biopsies of lymph node stations 7, 4L, and 10L (subcarinal, low left paratracheal, and left hilar) were positive for malignancy.  PET scan on 10/27/2019 revealed previously demonstrated right upper lobe lung mass and the confluent right hilar and mediastinal adenopathy were hypermetabolic c/w bronchogenic carcinoma. Central necrosis suggested squamous cell carcinoma. There were no definite distant metastases. Peripheral metabolic activity in the inferior aspect of the right hepatic lobe is relatively linear in orientation and without clear corresponding finding on the CT images. There were stable compression deformities at T10, T11 and T12.  Head MRI on 11/09/2019 revealed an intermittently motion degraded exam, but overall stable MRI since 06/2019.  There was no  metastatic disease or acute intracranial abnormality.  She was admitted with generalized weakness, poor oral intake, LLQ abdominal pain, nausea, vomiting, and diarrhea.  She denied fever and chills.  She had a chronic cough.  Initial WBC 31,000.    CXR revealed a right suprahilar mass with surrounding airspace opacities (unchnaged).  Abdomen and pelvis CT revealed right lower lobe consolidation and mild distal colitis.  She was started on vancomycin and Zosyn.  Symptomatically, she is fatigued.  She has chronic shortness of breath.  She notes mild LLQ pain.  Plan:   1.   Stage III squamous cell lung cancer  Review pathology from bronchoscopy.  Patient has clinical stage III squamous cell lung cancer.  Discuss treatment with concurrent chemotherapy (weekly carboplatin and Taxol) followed by a year of durvalumab.   Progression free survival 17-18 months.    Potential side effects of chemotherapy, radiation, and immunotherapy reviewed.  Patient currently unsure  if she wishes to receive treatment.  Her daughter is arriving tomorrow.  She wishes to discuss with her daughter and sister.   2.  Right lower lobe pneumonia  Possible aspiration event.  Continue antibiotics. 3.  Code status  Patient confirms DNR/DNI code status.  Thank you for allowing me to participate in Keonda Dow 's care.  I will follow her closely with you while hospitalized and after discharge in the outpatient department.   Lequita Asal, MD  11/10/2019, 4:49 PM

## 2019-11-10 NOTE — Consult Note (Signed)
[place]  [phone] [phone]   Name: Anne Hardy Date: 11/10/2019 MRN: [id number]  DOB: 09-15-1950  Patient Care Team: Toni Arthurs, NP as PCP - General (Family Medicine) Lequita Asal, MD as Referring Physician (Hematology and Oncology) Ottie Glazier, MD as Consulting Physician (Pulmonary Disease) Erby Pian, MD as Referring Physician (Specialist)    REASON FOR CONSULTATION: Anne Hardy is a 68 y.o. female with multiple medical problems including COPD on home O2, recent diagnosis of lung cancer with PET positive right upper lobe lung mass with hypermetabolic hilar and mediastinal adenopathy but no distant metastasis.  Patient has had chronically poor oral intake and weight loss.  She was admitted to the hospital 11/09/2019 with sepsis and a right lower lobe pneumonia concerning for aspiration.  Patient was also found to have colitis on CT.  Palliative care was consulted to address goals.  SOCIAL HISTORY:     reports that she has been smoking cigarettes. She has smoked for the past 50.00 years. She has never used smokeless tobacco. She reports current alcohol use. She reports that she does not use drugs.   Patient is divorced.  She moved to [place] from [place] to live with her sister.  She has a daughter in [place].  Patient retired as a Equities trader.  ADVANCE DIRECTIVES:  Not on file  CODE STATUS: DNR  PAST MEDICAL HISTORY: Past Medical History:  Diagnosis Date  . Arthritis   . Cervical disc disease   . COPD (chronic obstructive pulmonary disease) ([place])   . Dyspnea   . GERD (gastroesophageal reflux disease)   . Headache    severe headaches  . Stroke [facility]) 06/2018   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-no residual effedcts-this happened in [place]-Pt just moved to [place] 2 weeks ago (October 2020)    PAST SURGICAL HISTORY:  Past Surgical History:  Procedure  Laterality Date  . COLONOSCOPY WITH ESOPHAGOGASTRODUODENOSCOPY (EGD)    . femur fx repair    . FRACTURE SURGERY  2018   femur fx  . KYPHOPLASTY N/A 01/28/2019   Procedure: T12 KYPHOPLASTY;  Surgeon: Hessie Knows, MD;  Location: [facility] ORS;  Service: Orthopedics;  Laterality: N/A;  . OOPHORECTOMY Left   . VIDEO BRONCHOSCOPY WITH ENDOBRONCHIAL NAVIGATION N/A 11/05/2019   Procedure: VIDEO BRONCHOSCOPY WITH ENDOBRONCHIAL NAVIGATION;  Surgeon: Ottie Glazier, MD;  Location: [facility] ORS;  Service: Thoracic;  Laterality: N/A;  . VIDEO BRONCHOSCOPY WITH ENDOBRONCHIAL ULTRASOUND N/A 11/05/2019   Procedure: VIDEO BRONCHOSCOPY WITH ENDOBRONCHIAL ULTRASOUND;  Surgeon: Ottie Glazier, MD;  Location: [facility] ORS;  Service: Thoracic;  Laterality: N/A;    HEMATOLOGY/ONCOLOGY HISTORY:  Oncology History   No history exists.    ALLERGIES:  is allergic to lactose intolerance (gi).  MEDICATIONS:  Current Facility-Administered Medications  Medication Dose Route Frequency Provider Last Rate Last Admin  . acetaminophen (TYLENOL) tablet 650 mg  650 mg Oral Q6H PRN Athena Masse, MD       Or  . acetaminophen (TYLENOL) suppository 650 mg  650 mg Rectal Q6H PRN Athena Masse, MD      . azithromycin (ZITHROMAX) 500 mg in sodium chloride 0.9 % 250 mL IVPB  500 mg Intravenous Q24H Athena Masse, MD   Stopped at 11/10/19 [phone]  . enoxaparin (LOVENOX) injection 30 mg  30 mg Subcutaneous Q24H Judd Gaudier V, MD   30 mg at 11/10/19 0538  . famotidine (PEPCID) tablet 40 mg  40  mg Oral QPM Max Sane, MD   40 mg at 11/10/19 1622  . feeding supplement (ENSURE ENLIVE) (ENSURE ENLIVE) liquid 237 mL  237 mL Oral BID BM Sharen Hones, MD      . gabapentin (NEURONTIN) capsule 100 mg  100 mg Oral TID Max Sane, MD   100 mg at 11/10/19 1622  . HYDROcodone-acetaminophen (NORCO/VICODIN) 5-325 MG per tablet 1 tablet  1 tablet Oral Q6H PRN Max Sane, MD   1 tablet at 11/10/19 1415  . iohexol (OMNIPAQUE) 9 MG/ML oral solution 500  mL  500 mL Oral BID PRN Paulette Blanch, MD   500 mL at 11/09/19 0213  . ipratropium-albuterol (DUONEB) 0.5-2.5 (3) MG/3ML nebulizer solution 3 mL  3 mL Nebulization TID Max Sane, MD   3 mL at 11/09/19 1344  . [START ON 11/11/2019] multivitamin with minerals tablet 1 tablet  1 tablet Oral Daily Sharen Hones, MD      . ondansetron [facility]) tablet 4 mg  4 mg Oral Q6H PRN Athena Masse, MD       Or  . ondansetron [facility]) injection 4 mg  4 mg Intravenous Q6H PRN Athena Masse, MD   4 mg at 11/09/19 2305  . piperacillin-tazobactam (ZOSYN) IVPB 3.375 g  3.375 g Intravenous Redmond Pulling, MD 12.5 mL/hr at 11/10/19 1547 Rate Verify at 11/10/19 1547  . traZODone (DESYREL) tablet 50 mg  50 mg Oral QHS Max Sane, MD   50 mg at 11/09/19 2255    VITAL SIGNS: BP 108/81 (BP Location: Right Arm)   Pulse 94   Temp 98.3 F (36.8 C)   Resp 18   Ht 5' 3"  (1.6 m)   Wt (!) 88 lb (39.9 kg)   SpO2 93%   BMI 15.59 kg/m  Filed Weights   11/09/19 0144  Weight: (!) 88 lb (39.9 kg)    Estimated body mass index is 15.59 kg/m as calculated from the following:   Height as of this encounter: 5' 3"  (1.6 m).   Weight as of this encounter: 88 lb (39.9 kg).  LABS: CBC:    Component Value Date/Time   WBC 20.1 (H) 11/10/2019 0321   HGB 9.1 (L) 11/10/2019 0321   HCT 28.1 (L) 11/10/2019 0321   PLT 389 11/10/2019 0321   MCV 84.9 11/10/2019 0321   NEUTROABS 29.4 (H) 11/09/2019 0136   LYMPHSABS 0.5 (L) 11/09/2019 0136   MONOABS 0.9 11/09/2019 0136   EOSABS 0.0 11/09/2019 0136   BASOSABS 0.1 11/09/2019 0136   Comprehensive Metabolic Panel:    Component Value Date/Time   NA 133 (L) 11/10/2019 0321   K 3.7 11/10/2019 0321   CL 103 11/10/2019 0321   CO2 24 11/10/2019 0321   BUN 6 (L) 11/10/2019 0321   CREATININE 0.54 11/10/2019 0321   GLUCOSE 104 (H) 11/10/2019 0321   CALCIUM 7.5 (L) 11/10/2019 0321   AST 12 (L) 11/09/2019 0136   ALT 13 11/09/2019 0136   ALKPHOS 100 11/09/2019 0136   BILITOT 0.9  11/09/2019 0136   PROT 6.5 11/09/2019 0136   ALBUMIN 3.0 (L) 11/09/2019 0136    RADIOGRAPHIC STUDIES: CT CHEST WO CONTRAST  Result Date: 11/02/2019 CLINICAL DATA:  Pulmonary mass, mediastinal adenopathy, pre biopsy planning EXAM: CT CHEST WITHOUT CONTRAST TECHNIQUE: Multidetector CT imaging of the chest was performed following the standard protocol without IV contrast. COMPARISON:  CT 10/07/2019, PET-CT 10/27/2019 FINDINGS: Cardiovascular: Mild coronary artery calcification. Global cardiac size within normal limits. No pericardial effusion. Mild  atherosclerotic calcification within the thoracic aorta without aneurysm. Mediastinum/Nodes: Confluency right paratracheal and right hilar adenopathy with the suprahilar mass is again identified. Distinct measurement of the right hilar mass in paratracheal adenopathy is no longer possible as these appears subtly confluency measuring 7.7 x 4.5 cm at axial image # 16/2. The confluency adenopathy abuts the ascending aorta and compresses the superior vena cava which is difficult to differentiate on this noncontrast examination. There is increasing soft tissue obliteration of the right mainstem bronchus as well as complete opacification of the right upper lobe pulmonary bronchus likely related to invasive disease with endobronchial extension. There is airway impaction within the segmental bronchi of the right lower lobe and right middle lobe which may represent mucous plugging given its relatively rapid development since prior examination. Pathologic aortopulmonary lymphadenopathy is again identified with the lymph node measuring 13 mm in short axis diameter at axial image # 18. Lungs/Pleura: Progressive postobstructive pneumonitis noted within the right upper lobe. There is increasing right-sided volume loss and right basilar atelectasis. Left lung is clear. Trace right pleural effusion has developed. No pneumothorax. Upper Abdomen: Unremarkable Musculoskeletal: Diffuse  body wall wasting. T12 vertebroplasty has been performed. No lytic or blastic bone lesions are seen. Bland appearing T10 and T11 compression fractures with moderate to severe loss of height are stable since prior examination. IMPRESSION: Progressive confluence of right hilar and right paratracheal adenopathy with the suprahilar mass, now inseparable. Increasing soft tissue within the right mainstem bronchus, now nearly completely obliterated, likely representing endobronchial extension of disease. Increasing mucoid impaction within the right middle and lower lobe with increasing right-sided volume loss and postobstructive pneumonitis, particularly within the right upper lobe. Electronically Signed   By: Fidela Salisbury MD   On: 11/02/2019 23:09   MR BRAIN W WO CONTRAST  Result Date: 11/09/2019 CLINICAL DATA:  69 year old female with recently diagnosed lung cancer. Staging. EXAM: MRI HEAD WITHOUT AND WITH CONTRAST TECHNIQUE: Multiplanar, multiecho pulse sequences of the brain and surrounding structures were obtained without and with intravenous contrast. CONTRAST:  13m GADAVIST GADOBUTROL 1 MMOL/ML IV SOLN COMPARISON:  PET-CT 10/27/2019, brain MRI 06/22/2019. FINDINGS: Brain: Study is intermittently degraded by motion artifact despite repeated imaging attempts. Postcontrast enhancement of the brain appears stable since March and within normal limits. No abnormal intracranial enhancement or dural thickening identified. No midline shift, mass effect, or evidence of intracranial mass lesion. No restricted diffusion to suggest acute infarction. No ventriculomegaly, extra-axial collection or acute intracranial hemorrhage. Cervicomedullary junction and pituitary are within normal limits. GPearline Cablesand white matter signal appears stable since March, with a small area of post ischemic appearing encephalomalacia in the left centrum semiovale and corona radiata. There is chronic superficial siderosis over both superior  hemispheres greater on the right, better demonstrated on March SWI due to motion artifact today. There is also some left occipital lobe involvement. Vascular: Major intracranial vascular flow voids are stable. The major dural venous sinuses are enhancing and appear to be patent. Skull and upper cervical spine: Stable, negative. Sinuses/Orbits: Stable, negative. Other: Mastoids remain clear. Grossly normal visible internal auditory structures. Scalp and face soft tissues appear negative. IMPRESSION: 1. Intermittently motion degraded exam today but overall stable MRI appearance of the brain since March. No metastatic disease or acute intracranial abnormality identified. 2. 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 Chronic small vessel ischemia suspected in the left hemisphere white matter. Electronically Signed   By: HGenevie AnnM.D.   On: 11/09/2019 22:59  CT Abdomen Pelvis W Contrast  Result Date: 11/09/2019 CLINICAL DATA:  History of lung carcinoma with abdominal pain, initial encounter EXAM: CT ABDOMEN AND PELVIS WITH CONTRAST TECHNIQUE: Multidetector CT imaging of the abdomen and pelvis was performed using the standard protocol following bolus administration of intravenous contrast. CONTRAST:  70m OMNIPAQUE IOHEXOL 300 MG/ML  SOLN COMPARISON:  10/27/2019 PET-CT FINDINGS: Lower chest: Consolidation in the right lower lobe is noted. Small right pleural effusion is noted. The left lung is clear. On delayed images there is a focal soft tissue lesion in the right hilum which corresponds to areas of increased activity on recent PET-CT consistent with lymphadenopathy. Hepatobiliary: No focal liver abnormality is seen. No gallstones, gallbladder wall thickening, or biliary dilatation. No focal mass lesion is noted to correspond with the area of increased metabolic activity on recent PET-CT. Pancreas: Unremarkable. No pancreatic ductal dilatation or surrounding inflammatory changes. Spleen:  Normal in size without focal abnormality. Adrenals/Urinary Tract: Adrenal glands are within normal limits. Kidneys are well visualized bilaterally within normal enhancement pattern. Delayed images demonstrate normal excretion of contrast material. The bladder is within normal limits. Stomach/Bowel: Appendix is not well visualized. The distal colon is decompressed with mild inflammatory change identified suggestive of mild colitis. These changes are new from the prior PET-CT. No small bowel abnormality is noted. The stomach is within normal limits. Vascular/Lymphatic: Aortic calcifications are seen. No significant lymphadenopathy is noted. Reproductive: Small calcified uterine fibroids are seen. Other: No abdominal wall hernia or abnormality. No abdominopelvic ascites. Musculoskeletal: Right hip fixation is noted. No acute bony abnormality is seen. Changes of prior vertebral augmentation are noted at T12. T10 compression deformity is noted. Mild L4 compression deformity is noted as well. IMPRESSION: Right lower lobe consolidation as well as lymphadenopathy in the right hilar region similar to that seen on prior PET-CT. Changes of mild colitis in the distal colon. Chronic compression deformities. Electronically Signed   By: MInez CatalinaM.D.   On: 11/09/2019 03:59   NM PET Image Initial (PI) Skull Base To Thigh  Result Date: 10/27/2019 CLINICAL DATA:  Initial treatment strategy for right lung mass, suspected bronchogenic carcinoma. EXAM: NUCLEAR MEDICINE PET SKULL BASE TO THIGH TECHNIQUE: 5.55 mCi F-18 FDG was injected intravenously. Full-ring PET imaging was performed from the skull base to thigh after the radiotracer. CT data was obtained and used for attenuation correction and anatomic localization. Fasting blood glucose: 102 mg/dl COMPARISON:  Chest CT 10/07/2019 FINDINGS: Mediastinal blood pool activity: SUV max 2.4 NECK: No hypermetabolic cervical lymph nodes are identified.There are no lesions of the  pharyngeal mucosal space. Incidental CT findings: none CHEST: The previously demonstrated confluent mediastinal and right hilar adenopathy is hypermetabolic with central necrosis. A right paratracheal component measuring up to 4.4 x 3.9 cm on image 76/3 has an SUV max of 11.8. Contiguous subcarinal component has an SUV max of 13.3. There is a separate high right paratracheal node measuring 6 mm on image 60/3 which has an SUV max of 4.3. There are small hypermetabolic nodes within the AP window, but no contralateral hilar adenopathy. Contiguous right upper lobe mass measuring 4.2 x 3.6 cm on image 72/3 also demonstrates central necrosis and peripheral hypermetabolic activity. No separate suspicious pulmonary nodules are identified. Incidental CT findings: Underlying centrilobular emphysema with postobstructive pneumonitis in the right upper and lower lobes. The right mainstem, right upper and middle lobe bronchi are occluded. There is atherosclerosis of the aorta, great vessels and coronary arteries. ABDOMEN/PELVIS: There is ill-defined mildly increased  metabolic activity peripherally in the inferior aspect of the right hepatic lobe. This is near the costal margin and has an SUV max of 4.2. There is no clear corresponding lesion on the CT images. The significance of this finding is uncertain, although metastatic disease is not strongly suspected. There is no hypermetabolic activity within the adrenal glands, spleen or pancreas. There is no hypermetabolic nodal activity. Incidental CT findings: Aortic and branch vessel atherosclerosis. Possible pelvic floor laxity. SKELETON: There is no hypermetabolic activity to suggest osseous metastatic disease. Grossly stable compression fractures at T10, T11 and T12 post spinal augmentation at T12. Low level metabolic activity at E72 appears treatment related. Incidental CT findings: Previous right proximal femoral ORIF. IMPRESSION: 1. The previously demonstrated right upper lobe  lung mass and the confluent right hilar and mediastinal adenopathy are hypermetabolic consistent with bronchogenic carcinoma. Central necrosis suggests squamous cell carcinoma. 2. No definite distant metastases. Peripheral metabolic activity in the inferior aspect of the right hepatic lobe is relatively linear in orientation and without clear corresponding finding on the CT images. 3. Stable compression deformities at T10, T11 and T12. Electronically Signed   By: Richardean Sale M.D.   On: 10/27/2019 13:26   DG Chest Port 1 View  Result Date: 11/09/2019 CLINICAL DATA:  Weakness EXAM: PORTABLE CHEST 1 VIEW COMPARISON:  November 05, 2019 FINDINGS: The heart size and mediastinal contours are unchanged. There appears to be a right suprahilar ill-defined mass/airspace opacity as on the prior exam. There is elevation of the right hemidiaphragm with subsegmental atelectasis. There is a small right pleural effusion present. The left lung is clear. No acute osseous abnormality. IMPRESSION: Right suprahilar mass with surrounding airspace opacities, not significantly changed since prior exam. Probable small right pleural effusion. Electronically Signed   By: Prudencio Pair M.D.   On: 11/09/2019 02:06   DG Chest Port 1 View  Result Date: 11/05/2019 CLINICAL DATA:  Status post bronchoscopy. EXAM: PORTABLE CHEST 1 VIEW COMPARISON:  CT 11/02/2019. FINDINGS: Right suprahilar mass with associated adjacent atelectasis/infiltrate again noted. Reference is made to CT report of 11/02/2019. No pleural effusion. No evidence of pneumothorax post bronchoscopy. Heart size stable. Interposition of colon under the right hemidiaphragm. This appears more prominent than on prior CT. To completely exclude free intraperitoneal air abdominal series suggested. Moderate gastric distention. This could also be further evaluated with abdominal series. Prior lower thoracic vertebroplasty. Multiple thoracic spine compression fractures again noted.  IMPRESSION: 1. Right suprahilar mass with associated adjacent atelectasis/infiltrate again noted. Reference is made to prior CT report 11/02/2019. No evidence of pneumothorax post bronchoscopy. 2. Interposition of the colon under the right hemidiaphragm again noted. This is appears more prominent than on prior CT. To exclude free intraperitoneal air abdominal series suggested. Moderate gastric distention. This can also be further evaluated with abdominal series. Electronically Signed   By: Marcello Moores  Register   On: 11/05/2019 09:59   DG C-Arm 1-60 Min-No Report  Result Date: 11/05/2019 Fluoroscopy was utilized by the requesting physician.  No radiographic interpretation.    PERFORMANCE STATUS (ECOG) : 3 - Symptomatic, >50% confined to bed  Review of Systems Unless otherwise noted, a complete review of systems is negative.  Physical Exam General: Thin, frail-appearing Pulmonary: Unlabored, on O2 Extremities: no edema, no joint deformities Skin: no rashes Neurological: Weakness but otherwise nonfocal  IMPRESSION: I met with patient to discuss goals.  Introduced palliative care services and attempted to establish therapeutic rapport.  Patient reports that her breathing is slightly better  today but she remains weak.  Oral intake has been chronically poor.  Patient states that prior to this hospitalization, she is living at home with her sister but has been declining over the past several weeks in regards to weakness.  She has required increasing amount of help from her sister for daily care.  Patient has seen Dr. Mike Gip and work-up is ongoing for presumed bronchogenic lung cancer.  Patient says that options for chemotherapy and/or XRT have been discussed.  However, patient states "I am not sure that those are in my plan."  She would like to pursue biopsy and then have more conversations with Dr. Mike Gip about options.  We did discuss the possible option of hospice care in the event that patient  decides to forego treatment.  We discussed CODE STATUS.  Patient says that she is a DNR/DNI.  She clarifies that she would not want to be resuscitated nor have her life prolonged artificially on machines. She says "I have always been a DNR."  Patient says that she would want her daughter is her 44.  PLAN: -Continue current scope of treatment -DNR/DNI -Recommend follow up with Dr. Mike Gip  -Recommend home based PC    Time Total: 60 minutes  Visit consisted of counseling and education dealing with the complex and emotionally intense issues of symptom management and palliative care in the setting of serious and potentially life-threatening illness.Greater than 50%  of this time was spent counseling and coordinating care related to the above assessment and plan.  Signed by: Altha Harm, PhD, NP-C

## 2019-11-10 NOTE — Progress Notes (Signed)
Initial Nutrition Assessment  DOCUMENTATION CODES:   Severe malnutrition in context of chronic illness  INTERVENTION:   Ensure Enlive po BID, each supplement provides 350 kcal and 20 grams of protein  MVI daily   Liberalize diet   Pt at high refeed risk; recommend monitor K, Mg and P labs daily as oral intake improves.   NUTRITION DIAGNOSIS:   Severe Malnutrition related to chronic illness (COPD, lung cancer) as evidenced by severe fat depletion, severe muscle depletion, 17 percent weight loss in 8 months.  GOAL:   Patient will meet greater than or equal to 90% of their needs  MONITOR:   PO intake, Supplement acceptance, Labs, Weight trends, Skin, I & O's  REASON FOR ASSESSMENT:   Malnutrition Screening Tool    ASSESSMENT:   69 year old female with history of COPD, recent diagnosis of lung cancer, history of stroke and gastroesophageal reflux disease who present to the hospital with shortness of breath, nausea vomiting and diarrhea. Pt found to have aspiration PNA and sepsis.  Unable to speak with pt today as pt ambulating to bedside toilet with nurse tech at time of RD visit. Pt's lunch tray was sitting on her side table with only bites gone from it. Per chart, pt down 18lbs(17%) over the past 8 months; this is significant. RD suspects pt with poor appetite and oral intake at baseline. Pt is ordered for Ensure supplements but refused both of them today. RD will add daily MVI and liberalize pt's diet to try and increase her oral intake. Of note, pt is lactose intolerant. SLP evaluation pending as pt here for aspiration concerns. Palliative consult pending; RD will monitor for GOC.   Medications reviewed and include: lovenox, pepcid, azithromycin, zosyn   Labs reviewed: Na 133(L) Wbc- 20.1(H), Hgb 9.1(L), Hct 28.1(L)  NUTRITION - FOCUSED PHYSICAL EXAM:    Most Recent Value  Orbital Region Severe depletion  Upper Arm Region Severe depletion  Thoracic and Lumbar Region  Severe depletion  Buccal Region Severe depletion  Temple Region Severe depletion  Clavicle Bone Region Severe depletion  Clavicle and Acromion Bone Region Severe depletion  Scapular Bone Region Severe depletion  Dorsal Hand Severe depletion  Patellar Region Severe depletion  Anterior Thigh Region Severe depletion  Posterior Calf Region Severe depletion  Edema (RD Assessment) None  Hair Reviewed  Eyes Reviewed  Mouth Reviewed  Skin Reviewed  Nails Reviewed     Diet Order:   Diet Order            Diet regular Room service appropriate? Yes; Fluid consistency: Thin  Diet effective now                EDUCATION NEEDS:   Not appropriate for education at this time  Skin:  Skin Assessment: Reviewed RN Assessment (ecchymosis)  Last BM:  7/27  Height:   Ht Readings from Last 1 Encounters:  11/09/19 5\' 3"  (1.6 m)    Weight:   Wt Readings from Last 1 Encounters:  11/09/19 (!) 39.9 kg    Ideal Body Weight:  52.3 kg  BMI:  Body mass index is 15.59 kg/m.  Estimated Nutritional Needs:   Kcal:  1300-1500kcal/day  Protein:  65-75g/day  Fluid:  1.0-1.2L/day  Koleen Distance MS, RD, LDN Please refer to [facility] for RD and/or RD on-call/weekend/after hours pager

## 2019-11-11 ENCOUNTER — Other Ambulatory Visit: Payer: Medicare Other

## 2019-11-11 ENCOUNTER — Other Ambulatory Visit: Payer: Self-pay | Admitting: Hematology and Oncology

## 2019-11-11 ENCOUNTER — Telehealth: Payer: Self-pay

## 2019-11-11 DIAGNOSIS — C3411 Malignant neoplasm of upper lobe, right bronchus or lung: Secondary | ICD-10-CM

## 2019-11-11 DIAGNOSIS — R531 Weakness: Secondary | ICD-10-CM | POA: Diagnosis not present

## 2019-11-11 DIAGNOSIS — K529 Noninfective gastroenteritis and colitis, unspecified: Secondary | ICD-10-CM | POA: Diagnosis not present

## 2019-11-11 DIAGNOSIS — J189 Pneumonia, unspecified organism: Secondary | ICD-10-CM

## 2019-11-11 DIAGNOSIS — A419 Sepsis, unspecified organism: Secondary | ICD-10-CM | POA: Diagnosis not present

## 2019-11-11 DIAGNOSIS — E43 Unspecified severe protein-calorie malnutrition: Secondary | ICD-10-CM

## 2019-11-11 LAB — GASTROINTESTINAL PANEL BY PCR, STOOL (REPLACES STOOL CULTURE)

## 2019-11-11 LAB — CBC WITH DIFFERENTIAL/PLATELET
Abs Immature Granulocytes: 0.11 10*3/uL — ABNORMAL HIGH (ref 0.00–0.07)
Basophils Absolute: 0.1 10*3/uL (ref 0.0–0.1)
Basophils Relative: 0 %
Eosinophils Absolute: 0.5 10*3/uL (ref 0.0–0.5)
Eosinophils Relative: 3 %
HCT: 31.4 % — ABNORMAL LOW (ref 36.0–46.0)
Hemoglobin: 9.9 g/dL — ABNORMAL LOW (ref 12.0–15.0)
Immature Granulocytes: 1 %
Lymphocytes Relative: 14 %
Lymphs Abs: 2.5 10*3/uL (ref 0.7–4.0)
MCH: 27 pg (ref 26.0–34.0)
MCHC: 31.5 g/dL (ref 30.0–36.0)
MCV: 85.8 fL (ref 80.0–100.0)
Monocytes Absolute: 1.5 10*3/uL — ABNORMAL HIGH (ref 0.1–1.0)
Monocytes Relative: 8 %
Neutro Abs: 13.8 10*3/uL — ABNORMAL HIGH (ref 1.7–7.7)
Neutrophils Relative %: 74 %
Platelets: 459 10*3/uL — ABNORMAL HIGH (ref 150–400)
RBC: 3.66 MIL/uL — ABNORMAL LOW (ref 3.87–5.11)
RDW: 15.9 % — ABNORMAL HIGH (ref 11.5–15.5)
WBC: 18.4 10*3/uL — ABNORMAL HIGH (ref 4.0–10.5)
nRBC: 0 % (ref 0.0–0.2)

## 2019-11-11 LAB — PROCALCITONIN: Procalcitonin: 6.07 ng/mL

## 2019-11-11 LAB — BASIC METABOLIC PANEL
Anion gap: 10 (ref 5–15)
BUN: <5 mg/dL — ABNORMAL LOW (ref 8–23)
CO2: 28 mmol/L (ref 22–32)
Calcium: 7.8 mg/dL — ABNORMAL LOW (ref 8.9–10.3)
Chloride: 98 mmol/L (ref 98–111)
Creatinine, Ser: 0.74 mg/dL (ref 0.44–1.00)
GFR calc Af Amer: >60 mL/min (ref >60)
GFR calc non Af Amer: >60 mL/min (ref >60)
Glucose, Bld: 94 mg/dL (ref 70–99)
Potassium: 3.3 mmol/L — ABNORMAL LOW (ref 3.5–5.1)
Sodium: 136 mmol/L (ref 135–145)

## 2019-11-11 LAB — URINE CULTURE: Culture: NO GROWTH

## 2019-11-11 LAB — C DIFFICILE QUICK SCREEN W PCR REFLEX
C Diff antigen: NEGATIVE
C Diff interpretation: NOT DETECTED
C Diff toxin: NEGATIVE

## 2019-11-11 LAB — MAGNESIUM: Magnesium: 2.1 mg/dL (ref 1.7–2.4)

## 2019-11-11 MED ORDER — POTASSIUM CHLORIDE 20 MEQ PO PACK
40.0000 meq | PACK | ORAL | Status: AC
  Administered 2019-11-11: 40 meq via ORAL
  Filled 2019-11-11: qty 2

## 2019-11-11 MED ORDER — PANTOPRAZOLE SODIUM 40 MG PO TBEC
40.0000 mg | DELAYED_RELEASE_TABLET | Freq: Every day | ORAL | Status: DC
  Administered 2019-11-11 – 2019-11-12 (×2): 40 mg via ORAL
  Filled 2019-11-11 (×3): qty 1

## 2019-11-11 NOTE — Telephone Encounter (Signed)
Spoke with Anne Hardy to inform her Per, Dr Mike Gip she will not do a virtual visit today but she can go to the ED to see the patient today after 5 . Anne Hardy was understanding and agreeable.

## 2019-11-11 NOTE — Progress Notes (Signed)
SLP Cancellation Note  Patient Details Name: Anne Hardy MRN: [id number] DOB: 1950-10-09   Cancelled treatment:       Reason Eval/Treat Not Completed: SLP screened, no needs identified, will sign off   Pt screened and nurse consulted. Pt is free of overt s/s of aspiration at bedside.   Anne Hardy M.S., CCC-SLP, [place] Office [phone]    Anne Hardy 11/11/2019, 11:37 AM

## 2019-11-11 NOTE — Evaluation (Signed)
Physical Therapy Evaluation Patient Details Name: Anne Hardy MRN: [id number] DOB: 21-Jul-1950 Today's Date: 11/11/2019   History of Present Illness  Pt is a 69 y.o. 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 Per MD impression, pt presents with sepsis related to colitis, RLL pneunomia, COPD w/ chronic respiratory failure, and primary lung cancer with metastisis.  Clinical Impression  Pt pleasant and motivated to participate during the session. Family members present at bedside upon arrival and provided some insight to pt history. Pt initially reported R shoulder pain 7/10 and requested pain meds; RN notified and pt given meds during session. Pt demonstrated mod I with bed mobility and required extra time and effort. Pt performed sit to stand x3 with CGA. Pt reported nausea and stomach pain with standing and sat back down immediately the first 2x before agreeing to walk the next time. Pt able to walk 20ft with CGA and no cueing for sequencing; pt looked strong throughout with smooth step-through gait pattern and only appeared to be limited by stomach pain. Pt returned to bed and lacked motivation for further mobility. Pt able to perform minimal amounts of supine exercise at this time; however, given education on multiple bed-level exercises with pt verbalized and demonstrated understanding. Pt HR and SpO2 monitored throughout session with SpO2 remaining in the low-mid 90s and HR in the mid-high 90s following mobility. Pt will benefit from HHPT services upon discharge to safely address deficits listed in patient problem list for decreased caregiver assistance and eventual return to PLOF.     Follow Up Recommendations Home health PT;Supervision for mobility/OOB    Equipment Recommendations  None recommended by PT    Recommendations for Other Services        Precautions / Restrictions Precautions Precautions: Fall Precaution Comments: severely upset stomach with mobility/exertion Restrictions Weight Bearing Restrictions: No      Mobility  Bed Mobility Overal bed mobility: Modified Independent Bed Mobility: Supine to Sit;Sit to Supine     Supine to sit: Modified independent (Device/Increase time) Sit to supine: Modified independent (Device/Increase time)   General bed mobility comments: increased time and effort to perform  Transfers Overall transfer level: Needs assistance Equipment used: Rolling walker (2 wheeled) Transfers: Sit to/from Stand Sit to Stand: Min guard Stand pivot transfers: Min guard       General transfer comment: pt performed sit-to-stand x3 with no physical assist and minimally increased effort.  Ambulation/Gait Ambulation/Gait assistance: Min guard Gait Distance (Feet): 6 Feet Assistive device: Rolling walker (2 wheeled) Gait Pattern/deviations: Step-through pattern;WFL(Within Functional Limits) Gait velocity: decreased   General Gait Details: Pt appeared strong and relatively WFL for the short observed distance ambulated today. Pt walked about 3 feet forward and 3 feet backwards before deferring further ambulation secondary to nausea and stomach pain.  Stairs            Wheelchair Mobility    Modified Rankin (Stroke Patients Only)       Balance Overall balance assessment: Needs assistance Sitting-balance support: No upper extremity supported;Feet supported Sitting balance-Leahy Scale: Good     Standing balance support: During functional activity;Bilateral upper extremity supported Standing balance-Leahy Scale: Fair Standing balance comment: pt appeared to have minimal weight through BUE on RW during ambulation; able to shift weight through BLE with no observed LOB  Pertinent Vitals/Pain Pain Assessment: 0-10 Pain Score: 7  Pain  Location: R shoulder - pinched nerve C6 which radiates out to shoulder (per patient report) Pain Descriptors / Indicators: Aching;Discomfort Pain Intervention(s): Limited activity within patient's tolerance;Monitored during session;Repositioned;RN gave pain meds during session;Patient requesting pain meds-RN notified    Home Living Family/patient expects to be discharged to:: Private residence Living Arrangements: Other relatives (sister) Available Help at Discharge: Family;Available 24 hours/day Type of Home: House Home Access: Stairs to enter Entrance Stairs-Rails: Left Entrance Stairs-Number of Steps: 3, L rail in garage Home Layout: One level Home Equipment: Bedside commode;Transport chair;Walker - 2 wheels;Cane - single point;Shower seat - built in      Prior Function Level of Independence: Needs assistance      ADL's / Homemaking Assistance Needed: started to require assist 5-6 wks ago after "major aspiration" which "caused the lung cancer" per pt report; sister does all cooking, cleaning, drives, sometimes assists with meds; takes sponge baths, ADL just more difficult 2/2 low energy (per OT)  Comments: just started home 2L O2 a week ago and pt unable to recall if she's supposed to wear all the time or only during ambulation (per OT); pt states for the past few weeks has primarily been getting around using transport chair and using her feet to propel herself 2/2 weakness. Prior to a few weeks ago, pt states independent without need for AD and was able to walk comm amb distances. Pt states 1 fall this past year (just prior to admission)     Hand Dominance   Dominant Hand: Right    Extremity/Trunk Assessment   Upper Extremity Assessment Upper Extremity Assessment: Generalized weakness    Lower Extremity Assessment Lower Extremity Assessment: Generalized weakness       Communication   Communication: No difficulties  Cognition Arousal/Alertness: Awake/alert Behavior  During Therapy: WFL for tasks assessed/performed Overall Cognitive Status: Within Functional Limits for tasks assessed                                 General Comments: grossly WFL during PT session and able to follow commands well; per OT 7/29 pt was confused to situation reporting lung cancer caused by aspiration as well as unable to recall when she is supposed to wear her O2      General Comments General comments (skin integrity, edema, etc.):    Exercises Total Joint Exercises Ankle Circles/Pumps: AROM;Both;5 reps Quad Sets: Both;5 reps;Strengthening Gluteal Sets: Both;5 reps;Strengthening Heel Slides: AROM;Strengthening;Both;10 reps Long Arc Quad: AROM;Strengthening;Both;10 reps  Other Exercises: pt given education on supine therex HEP Other Exercises: Pt given education on safety: fall risk involved with keeping a towel on the ground by [facility]   Assessment/Plan    PT Assessment Patient needs continued PT services  PT Problem List Decreased strength;Decreased activity tolerance;Decreased balance;Decreased mobility;Decreased knowledge of use of DME;Pain;Decreased safety awareness       PT Treatment Interventions DME instruction;Gait training;Stair training;Functional mobility training;Therapeutic activities;Therapeutic exercise;Balance training;Patient/family education    PT Goals (Current goals can be found in the Care Plan section)  Acute Rehab PT Goals Patient Stated Goal: get stronger and go home PT Goal Formulation: With patient Time For Goal Achievement: 11/24/19 Potential to Achieve Goals: Fair    Frequency Min 2X/week   Barriers to discharge        Co-evaluation               AM-PAC  PT "6 Clicks" Mobility  Outcome Measure Help needed turning from your back to your side while in a flat bed without using bedrails?: None Help needed moving from lying on your back to sitting on the side of a flat bed without using bedrails?: None Help needed  moving to and from a bed to a chair (including a wheelchair)?: A Little Help needed standing up from a chair using your arms (e.g., wheelchair or bedside chair)?: None Help needed to walk in hospital room?: A Lot Help needed climbing 3-5 steps with a railing? : A Lot 6 Click Score: 19    End of Session Equipment Utilized During Treatment: Gait belt;Oxygen Activity Tolerance: Patient limited by pain (stomach pain) Patient left: in bed;with call bell/phone within reach;with bed alarm set;with family/visitor present Nurse Communication: Mobility status PT Visit Diagnosis: Unsteadiness on feet (R26.81);Muscle weakness (generalized) (M62.81);Difficulty in walking, not elsewhere classified (R26.2);Pain Pain - Right/Left: Right Pain - part of body: Shoulder    Time: 6578-4696 PT Time Calculation (min) (ACUTE ONLY): 29 min   Charges:              Herbert Moors SPT 11/11/19, 5:28 PM

## 2019-11-11 NOTE — Progress Notes (Signed)
   11/11/19 2000  Assess: MEWS Score  ECG Heart Rate 71  Resp (!) 24  Assess: MEWS Score  MEWS Temp 0  MEWS Systolic 1  MEWS Pulse 0  MEWS RR 1  MEWS LOC 0  MEWS Score 2  MEWS Score Color Yellow  Assess: if the MEWS score is Yellow or Red  Were vital signs taken at a resting state? Yes  Focused Assessment No change from prior assessment  Early Detection of Sepsis Score *See Row Information* Low  MEWS guidelines implemented *See Row Information* No, vital signs rechecked (BP tends to rum low; intermit. tachypnea, will recheck RR)

## 2019-11-11 NOTE — Progress Notes (Signed)
OT Cancellation Note  Patient Details Name: Anne Hardy MRN: [id number] DOB: 1950/11/02   Cancelled Treatment:    Reason Eval/Treat Not Completed: Other (comment). Consult received, chart reviewed. Spoke with RN who reports she is preparing to assist pt with recent BM and for medication administration. Will re-attempt at later time this morning as pt is available and appropriate.   Jeni Salles, MPH, MS, OTR/L ascom [phone] 11/11/19, 9:11 AM

## 2019-11-11 NOTE — Progress Notes (Signed)
[facility] Hematology/Oncology Progress Note  Date of admission: 11/09/2019  Hospital day:  11/11/2019  Chief Complaint: Anne Hardy is a 69 y.o. 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  Subjective: Patient notes less shortness of breath.  Chronic cough.  Stool is C diff negative.  Social History: The patient is accompanied by her sister, daughter, and grand daughter today.   Allergies:  Allergies  Allergen Reactions  . Lactose Intolerance (Gi)     Scheduled Medications: . enoxaparin (LOVENOX) injection  30 mg Subcutaneous Q24H  . feeding supplement (ENSURE ENLIVE)  237 mL Oral BID BM  . gabapentin  100 mg Oral TID  . ipratropium-albuterol  3 mL Nebulization TID  . multivitamin with minerals  1 tablet Oral Daily  . pantoprazole  40 mg Oral Daily  . traZODone  50 mg Oral QHS    Review of Systems: GENERAL:  Fatigue.  No fevers, sweats.  Weight loss. PERFORMANCE STATUS (ECOG):  2 HEENT:  No visual changes, runny nose, sore throat, mouth sores or tenderness. Lungs: Shortness of breath.  Cough.  No hemoptysis. Cardiac:  No chest pain, palpitations, orthopnea, or PND. GI:  Poor appetite.  No nausea, vomiting, diarrhea, constipation, melena or hematochezia. GU:  No urgency, frequency, dysuria, or hematuria. Musculoskeletal:  Chronic right shoulder and neck pain. Extremities:  No pain or swelling. Skin:  No rashes or skin changes. Neuro:  General weakness.  No headache, numbness or weakness, balance or coordination issues. Endocrine:  No diabetes, thyroid issues, hot flashes or night sweats. Pain:  Chronic pain. Review of systems:  All other systems reviewed and found to be negative.  Physical Exam: Blood pressure (!) 88/70, pulse 80, temperature 98.4 F (36.9 C), temperature source Oral, resp. rate 18, height 5\' 3"  (1.6 m), weight (!) 90 lb 9.6  oz (41.1 kg), SpO2 96 %.  GENERAL:  Thin chronically fatigued appearing woman lying in bed in no acute distress. MENTAL STATUS:  Alert and oriented to person, place and time. HEAD:  Grayhair.  Normocephalic, atraumatic, face symmetric, no Cushingoid features. EYES:  Blue eyes.  No conjunctivitis or scleral icterus. PSYCH:  Appropriate.   Results for orders placed or performed during the hospital encounter of 11/09/19 (from the past 48 hour(s))  HIV Antibody (routine testing w rflx)     Status: None   Collection Time: 11/10/19  3:21 AM  Result Value Ref Range   HIV Screen 4th Generation wRfx Non Reactive Non Reactive    Comment: Performed at [facility], [place] [address], [place], [place] [address]  Protime-INR     Status: Abnormal   Collection Time: 11/10/19  3:21 AM  Result Value Ref Range   Prothrombin Time 15.2 11.4 - 15.2 seconds   INR 1.3 (H) 0.8 - 1.2    Comment: (NOTE) INR goal varies based on device and disease states. Performed at [facility], [address] [place], [place] [address]   Cortisol-am, blood     Status: None   Collection Time: 11/10/19  3:21 AM  Result Value Ref Range   Cortisol - AM 11.2 6.7 - 22.6 ug/dL    Comment: Performed at [facility], [place] [address], [place], [place] [address]  Procalcitonin     Status: None   Collection Time: 11/10/19  3:21 AM  Result Value Ref Range   Procalcitonin 11.32 ng/mL  Comment:        Interpretation: PCT >= 10 ng/mL: Important systemic inflammatory response, almost exclusively due to severe bacterial sepsis or septic shock. (NOTE)       Sepsis PCT Algorithm           Lower Respiratory Tract                                      Infection PCT Algorithm    ----------------------------     ----------------------------         PCT < 0.25 ng/mL                PCT < 0.10 ng/mL          Strongly encourage             Strongly discourage   discontinuation of antibiotics    initiation of  antibiotics    ----------------------------     -----------------------------       PCT 0.25 - 0.50 ng/mL            PCT 0.10 - 0.25 ng/mL               OR       >80% decrease in PCT            Discourage initiation of                                            antibiotics      Encourage discontinuation           of antibiotics    ----------------------------     -----------------------------         PCT >= 0.50 ng/mL              PCT 0.26 - 0.50 ng/mL                AND       <80% decrease in PCT             Encourage initiation of                                             antibiotics       Encourage continuation           of antibiotics    ----------------------------     -----------------------------        PCT >= 0.50 ng/mL                  PCT > 0.50 ng/mL               AND         increase in PCT                  Strongly encourage                                      initiation of antibiotics    Strongly encourage escalation           of antibiotics                                     -----------------------------  PCT <= 0.25 ng/mL                                                 OR                                        > 80% decrease in PCT                                      Discontinue / Do not initiate                                             antibiotics  Performed at [facility], [address] [place], [place] [address]   CBC     Status: Abnormal   Collection Time: 11/10/19  3:21 AM  Result Value Ref Range   WBC 20.1 (H) 4.0 - 10.5 K/uL   RBC 3.31 (L) 3.87 - 5.11 MIL/uL   Hemoglobin 9.1 (L) 12.0 - 15.0 g/dL   HCT 28.1 (L) 36 - 46 %   MCV 84.9 80.0 - 100.0 fL   MCH 27.5 26.0 - 34.0 pg   MCHC 32.4 30.0 - 36.0 g/dL   RDW 15.7 (H) 11.5 - 15.5 %   Platelets 389 150 - 400 K/uL   nRBC 0.0 0.0 - 0.2 %    Comment: Performed at [facility], [address] [place], [place] [address]  Basic  metabolic panel     Status: Abnormal   Collection Time: 11/10/19  3:21 AM  Result Value Ref Range   Sodium 133 (L) 135 - 145 mmol/L   Potassium 3.7 3.5 - 5.1 mmol/L   Chloride 103 98 - 111 mmol/L   CO2 24 22 - 32 mmol/L   Glucose, Bld 104 (H) 70 - 99 mg/dL    Comment: Glucose reference range applies only to samples taken after fasting for at least 8 hours.   BUN 6 (L) 8 - 23 mg/dL   Creatinine, Ser 0.54 0.44 - 1.00 mg/dL   Calcium 7.5 (L) 8.9 - 10.3 mg/dL   GFR calc non Af Amer >60 >60 mL/min   GFR calc Af Amer >60 >60 mL/min   Anion gap 6 5 - 15    Comment: Performed at [facility], [address] [place], [place] [address]  CBC with Differential/Platelet     Status: Abnormal   Collection Time: 11/11/19  4:22 AM  Result Value Ref Range   WBC 18.4 (H) 4.0 - 10.5 K/uL   RBC 3.66 (L) 3.87 - 5.11 MIL/uL   Hemoglobin 9.9 (L) 12.0 - 15.0 g/dL   HCT 31.4 (L) 36 - 46 %   MCV 85.8 80.0 - 100.0 fL   MCH 27.0 26.0 - 34.0 pg   MCHC 31.5 30.0 - 36.0 g/dL   RDW 15.9 (H) 11.5 - 15.5 %   Platelets 459 (H) 150 - 400 K/uL   nRBC 0.0 0.0 - 0.2 %   Neutrophils Relative % 74 %   Neutro Abs 13.8 (H) 1.7 -  7.7 K/uL   Lymphocytes Relative 14 %   Lymphs Abs 2.5 0.7 - 4.0 K/uL   Monocytes Relative 8 %   Monocytes Absolute 1.5 (H) 0 - 1 K/uL   Eosinophils Relative 3 %   Eosinophils Absolute 0.5 0 - 0 K/uL   Basophils Relative 0 %   Basophils Absolute 0.1 0 - 0 K/uL   Immature Granulocytes 1 %   Abs Immature Granulocytes 0.11 (H) 0.00 - 0.07 K/uL    Comment: Performed at [facility], [address] [place], [place] [address]  Basic metabolic panel     Status: Abnormal   Collection Time: 11/11/19  4:22 AM  Result Value Ref Range   Sodium 136 135 - 145 mmol/L   Potassium 3.3 (L) 3.5 - 5.1 mmol/L   Chloride 98 98 - 111 mmol/L   CO2 28 22 - 32 mmol/L   Glucose, Bld 94 70 - 99 mg/dL    Comment: Glucose reference range applies only to samples taken after fasting for at least  8 hours.   BUN <5 (L) 8 - 23 mg/dL   Creatinine, Ser 0.74 0.44 - 1.00 mg/dL   Calcium 7.8 (L) 8.9 - 10.3 mg/dL   GFR calc non Af Amer >60 >60 mL/min   GFR calc Af Amer >60 >60 mL/min   Anion gap 10 5 - 15    Comment: Performed at [facility], [address], [place], [place] [address]  Magnesium     Status: None   Collection Time: 11/11/19  4:22 AM  Result Value Ref Range   Magnesium 2.1 1.7 - 2.4 mg/dL    Comment: Performed at [facility], [address] [place], [place] [address]  Procalcitonin - Baseline     Status: None   Collection Time: 11/11/19  4:22 AM  Result Value Ref Range   Procalcitonin 6.07 ng/mL    Comment:        Interpretation: PCT > 2 ng/mL: Systemic infection (sepsis) is likely, unless other causes are known. (NOTE)       Sepsis PCT Algorithm           Lower Respiratory Tract                                      Infection PCT Algorithm    ----------------------------     ----------------------------         PCT < 0.25 ng/mL                PCT < 0.10 ng/mL          Strongly encourage             Strongly discourage   discontinuation of antibiotics    initiation of antibiotics    ----------------------------     -----------------------------       PCT 0.25 - 0.50 ng/mL            PCT 0.10 - 0.25 ng/mL               OR       >80% decrease in PCT            Discourage initiation of  antibiotics      Encourage discontinuation           of antibiotics    ----------------------------     -----------------------------         PCT >= 0.50 ng/mL              PCT 0.26 - 0.50 ng/mL               AND       <80% decrease in PCT              Encourage initiation of                                             antibiotics       Encourage continuation           of antibiotics    ----------------------------     -----------------------------        PCT >= 0.50 ng/mL                  PCT > 0.50 ng/mL                AND         increase in PCT                  Strongly encourage                                      initiation of antibiotics    Strongly encourage escalation           of antibiotics                                     -----------------------------                                           PCT <= 0.25 ng/mL                                                 OR                                        > 80% decrease in PCT                                      Discontinue / Do not initiate                                             antibiotics  Performed at [facility], [address] [place], [place] [address]   C Difficile Quick Screen w PCR reflex     Status:  None   Collection Time: 11/11/19 10:33 AM   Specimen: STOOL  Result Value Ref Range   C Diff antigen NEGATIVE NEGATIVE   C Diff toxin NEGATIVE NEGATIVE   C Diff interpretation No C. difficile detected.     Comment: Performed at [facility], [address] [place], [place] [address]  Gastrointestinal Panel by PCR , Stool     Status: None   Collection Time: 11/11/19 10:33 AM   Specimen: Stool  Result Value Ref Range   Campylobacter species NOT DETECTED NOT DETECTED   Plesimonas shigelloides NOT DETECTED NOT DETECTED   Salmonella species NOT DETECTED NOT DETECTED   Yersinia enterocolitica NOT DETECTED NOT DETECTED   Vibrio species NOT DETECTED NOT DETECTED   Vibrio cholerae NOT DETECTED NOT DETECTED   Enteroaggregative E coli (EAEC) NOT DETECTED NOT DETECTED   Enteropathogenic E coli (EPEC) NOT DETECTED NOT DETECTED   Enterotoxigenic E coli (ETEC) NOT DETECTED NOT DETECTED   Shiga like toxin producing E coli (STEC) NOT DETECTED NOT DETECTED   Shigella/Enteroinvasive E coli (EIEC) NOT DETECTED NOT DETECTED   Cryptosporidium NOT DETECTED NOT DETECTED   Cyclospora cayetanensis NOT DETECTED NOT DETECTED   Entamoeba histolytica NOT DETECTED NOT DETECTED   Giardia lamblia NOT DETECTED NOT DETECTED    Adenovirus F40/41 NOT DETECTED NOT DETECTED   Astrovirus NOT DETECTED NOT DETECTED   Norovirus GI/GII NOT DETECTED NOT DETECTED   Rotavirus A NOT DETECTED NOT DETECTED   Sapovirus (I, II, IV, and V) NOT DETECTED NOT DETECTED    Comment: Performed at [facility], [address], [place], [place] [address]   MR BRAIN W WO CONTRAST  Result Date: 11/09/2019 CLINICAL DATA:  69 year old female with recently diagnosed lung cancer. Staging. EXAM: MRI HEAD WITHOUT AND WITH CONTRAST TECHNIQUE: Multiplanar, multiecho pulse sequences of the brain and surrounding structures were obtained without and with intravenous contrast. CONTRAST:  60mL GADAVIST GADOBUTROL 1 MMOL/ML IV SOLN COMPARISON:  PET-CT 10/27/2019, brain MRI 06/22/2019. FINDINGS: Brain: Study is intermittently degraded by motion artifact despite repeated imaging attempts. Postcontrast enhancement of the brain appears stable since March and within normal limits. No abnormal intracranial enhancement or dural thickening identified. No midline shift, mass effect, or evidence of intracranial mass lesion. No restricted diffusion to suggest acute infarction. No ventriculomegaly, extra-axial collection or acute intracranial hemorrhage. Cervicomedullary junction and pituitary are within normal limits. Pearline Cables and white matter signal appears stable since March, with a small area of post ischemic appearing encephalomalacia in the left centrum semiovale and corona radiata. There is chronic superficial siderosis over both superior hemispheres greater on the right, better demonstrated on March SWI due to motion artifact today. There is also some left occipital lobe involvement. Vascular: Major intracranial vascular flow voids are stable. The major dural venous sinuses are enhancing and appear to be patent. Skull and upper cervical spine: Stable, negative. Sinuses/Orbits: Stable, negative. Other: Mastoids remain clear. Grossly normal visible internal auditory  structures. Scalp and face soft tissues appear negative. IMPRESSION: 1. Intermittently motion degraded exam today but overall stable MRI appearance of the brain since March. No metastatic disease or acute intracranial abnormality identified. 2. 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 Chronic small vessel ischemia suspected in the left hemisphere white matter. Electronically Signed   By: Genevie Ann M.D.   On: 11/09/2019 22:59    Assessment:  Anne Hardy is a 69 y.o. female with stage IIIA squamous cell carcinoma of the left lung s/p bronchoscopy and EUS  on 07/23/201.  She was noted to have an exophytic lesion in the right mainstem bronchuswithfriable mucosa. Pathologyrevealed non-small cell lung cancer c/w squamous cell carcinoma. Biopsies of lymph node stations 7, 4L, and 10L (subcarinal, low left paratracheal, and left hilar) were positive for malignancy.  PET scan on 10/27/2019 revealedpreviously demonstrated right upper lobe lung mass and the confluent right hilar and mediastinal adenopathy were hypermetabolic c/wbronchogenic carcinoma. Central necrosis suggestedsquamous cell carcinoma. There were no definite distant metastases. Peripheral metabolic activity in the inferior aspect of the right hepatic lobe is relatively linear in orientation and without clear corresponding finding on the CT images.There werestable compression deformities at T10, T11 and T12.  HeadMRI on 11/09/2019 revealed an intermittently motion degraded exam, but overall stable MRI since 06/2019. There was no metastatic disease or acute intracranial abnormality.  She was admitted with generalized weakness, poor oral intake, LLQ abdominal pain, nausea, vomiting, and diarrhea.  She denied fever and chills.  She had a chronic cough.  Initial WBC 31,000.    CXR revealed a right suprahilar mass with surrounding airspace opacities (unchnaged).  Abdomen and pelvis CT revealed right lower lobe  consolidation and mild distal colitis.  She was started on vancomycin and Zosyn.  Symptomatically, she remains fatigued.  She is unsure if she wishes to pursue treatment.  Plan:   1.   Stage III squamous cell lung cancer             Review with patient and family pathology from bronchoscopy and stage of cancer.  Disease is not surgically resectable.  She has clinical stage III squamous cell lung cancer.             Review conversation from yesterday regarding treatment with concurrent chemotherapy (weekly carboplatin and Taxol) and radiation followed by a year of durvalumab.                         Progression free survival 17-18 months.  Median survival 48 months.                         Side effects of radiation, Taxol, and carboplatin were reviewed in detail.             Patient interested in discussing radiation further with Dr Baruch Gouty in the outpatient department.             Discussed Hospice if patient does not wish to pursue treatment.  Patient remains undecided.  Multiple questions asked and answered. 2.  Right lower lobe pneumonia             Possible aspiration event.             Patient currently on Zosyn and azithromycin.  Anticipate switch to po Augmentin tomorrow. 3.  Code status             DNR/DNI. 4.   Disposition  Patient notes possible discharge tomorrow.  Anticipate follow-up with radiation oncology and medical oncology next week.   Lequita Asal, MD  11/11/2019, 10:32 PM

## 2019-11-11 NOTE — Progress Notes (Signed)
PROGRESS NOTE    Anne Hardy  QQI:[id number] DOB: 10/10/1950 DOA: 11/09/2019 PCP: Toni Arthurs, NP   Chief complaint.  Shortness of breath. Brief Narrative:  Patient is a 69 year old female with history of COPD, recent diagnosis of lung cancer, history of stroke and gastroesophageal reflux disease who present to the hospital with shortness of breath, nausea vomiting and diarrhea. Patient has been losing weight recently.  Patient states that she had a massive amount of acid reflux about 4 to 5 weeks ago, with large amount of aspiration and choking.  Since that time, she has been complaining short of breath, which is progressively getting worse.  She was placed on oxygen about 2 to 3 weeks ago.  She also has some clear mucus with cough.  She did not have any fever or chills.  She started have a nausea vomiting diarrhea for the last few days. Upon arriving the emergency room, a chest CT scan showed right lower lobe consolidation and mild colitis in the distal colon.  She was treated with Rocephin, Zithromax and Flagyl.  7/28.  Patient condition is consistent with aspiration pneumonia, she has a profound elevation of procalcitonin level.  Antibiotic switched to Zosyn, Zithromax continued.  Flagyl discontinued.  7/29.  Condition improving.  Continue antibiotics with Zosyn and Zithromax.  Speech therapy evaluation did not show any aspiration.  Assessment & Plan:   Principal Problem:   Sepsis ([place]) Active Problems:   Primary cancer of right upper lobe of lung ([place])   Colitis   RLL pneumonia   Palliative care encounter   Severe protein-calorie malnutrition [place] Oiler: less than 60% of standard weight) ([place])  #1.  Sepsis with the right lower lobe pneumonia secondary to aspiration pneumonia. Condition finally improved.  Procalcitonin level is better today.  Continue another day of IV Zosyn and Zithromax.  Planning to change to oral Augmentin tomorrow.  2.  COPD with hypoxemic respiratory  failure. Hypoxemia present before admission. Patient does not have any bronchospasm.  Continue oxygen treatment.  4.  Severe protein calorie malnutrition. Continue supplement.  5.  Lung cancer with metastasis. Follow-up with oncology as outpatient.  6.  Colitis. Improving.  C. difficile antigen negative.  7.  Hypokalemia. Supplement.  DVT prophylaxis: Lovenox Code Status: Full Family Communication: None Disposition Plan:   Patient came from:      Home                                                                                                           Anticipated d/c place: Home  Barriers to d/c OR conditions which need to be met to effect a safe d/c:   Consultants:   None  Procedures: None Antimicrobials:  Zosyn and Zithromax.   Subjective: Patient does not have a significant short of breath today.  Has a cough with clear mucus.  She still having left lower quadrant cramping, diarrhea is better.  No nausea vomiting  Objective: Vitals:   11/10/19 2016 11/11/19 0300 11/11/19 0835 11/11/19 1212  BP: 104/68 (!) 92/62 95/76 97/72   Pulse:  71 93 85  Resp: (!) 25 16 17 17   Temp: 97.8 F (36.6 C) 98.3 F (36.8 C) 98.8 F (37.1 C) 98.4 F (36.9 C)  TempSrc: Oral Oral Oral Oral  SpO2: 94%  96% 93%  Weight:  (!) 41.1 kg    Height:        Intake/Output Summary (Last 24 hours) at 11/11/2019 1423 Last data filed at 11/11/2019 0600 Gross per 24 hour  Intake 744.94 ml  Output 702 ml  Net 42.94 ml   Filed Weights   11/09/19 0144 11/11/19 0300  Weight: (!) 39.9 kg (!) 41.1 kg    Examination:  General exam: Appears calm and comfortable.  [place]. Respiratory system: Decreased breathing sounds without crackles wheezes. Respiratory effort normal. Cardiovascular system: S1 & S2 heard, RRR. No JVD, murmurs, rubs, gallops or clicks. No pedal edema. Gastrointestinal system: Abdomen is nondistended, soft and LLQ tender. No organomegaly or masses felt. Normal  bowel sounds heard. Central nervous system: Alert and oriented. No focal neurological deficits. Extremities: Symmetric  Skin: No rashes, lesions or ulcers Psychiatry: Judgement and insight appear normal. Mood & affect appropriate.     Data Reviewed: I have personally reviewed following labs and imaging studies  CBC: Recent Labs  Lab 11/09/19 0136 11/10/19 0321 11/11/19 0422  WBC 31.1* 20.1* 18.4*  NEUTROABS 29.4*  --  13.8*  HGB 12.7 9.1* 9.9*  HCT 40.4 28.1* 31.4*  MCV 86.1 84.9 85.8  PLT 545* 389 884*   Basic Metabolic Panel: Recent Labs  Lab 11/09/19 0136 11/10/19 0321 11/11/19 0422  NA 131* 133* 136  K 3.5 3.7 3.3*  CL 96* 103 98  CO2 24 24 28   GLUCOSE 170* 104* 94  BUN 14 6* <5*  CREATININE 0.77 0.54 0.74  CALCIUM 8.8* 7.5* 7.8*  MG  --   --  2.1   GFR: Estimated Creatinine Clearance: 44.3 mL/min (by C-G formula based on SCr of 0.74 mg/dL). Liver Function Tests: Recent Labs  Lab 11/09/19 0136  AST 12*  ALT 13  ALKPHOS 100  BILITOT 0.9  PROT 6.5  ALBUMIN 3.0*   No results for input(s): LIPASE, AMYLASE in the last 168 hours. No results for input(s): AMMONIA in the last 168 hours. Coagulation Profile: Recent Labs  Lab 11/10/19 0321  INR 1.3*   Cardiac Enzymes: No results for input(s): CKTOTAL, CKMB, CKMBINDEX, TROPONINI in the last 168 hours. BNP (last 3 results) No results for input(s): PROBNP in the last 8760 hours. HbA1C: No results for input(s): HGBA1C in the last 72 hours. CBG: No results for input(s): GLUCAP in the last 168 hours. Lipid Profile: No results for input(s): CHOL, HDL, LDLCALC, TRIG, CHOLHDL, LDLDIRECT in the last 72 hours. Thyroid Function Tests: No results for input(s): TSH, T4TOTAL, FREET4, T3FREE, THYROIDAB in the last 72 hours. Anemia Panel: No results for input(s): VITAMINB12, FOLATE, FERRITIN, TIBC, IRON, RETICCTPCT in the last 72 hours. Sepsis Labs: Recent Labs  Lab 11/09/19 0136 11/10/19 0321 11/11/19 0422    PROCALCITON 1.57 11.32 6.07  LATICACIDVEN 1.9  --   --     Recent Results (from the past 240 hour(s))  SARS CORONAVIRUS 2 (TAT 6-24 HRS) Nasopharyngeal Nasopharyngeal Swab     Status: None   Collection Time: 11/02/19  1:38 PM   Specimen: Nasopharyngeal Swab  Result Value Ref Range Status   SARS Coronavirus 2 NEGATIVE NEGATIVE Final    Comment: (NOTE) SARS-CoV-2 target nucleic acids are NOT DETECTED.  The SARS-CoV-2 RNA is generally detectable in  upper and lower respiratory specimens during the acute phase of infection. Negative results do not preclude SARS-CoV-2 infection, do not rule out co-infections with other pathogens, and should not be used as the sole basis for treatment or other patient management decisions. Negative results must be combined with clinical observations, patient history, and epidemiological information. The expected result is Negative.  Fact Sheet for Patients: SugarRoll.be  Fact Sheet for Healthcare Providers: https://www.woods-mathews.com/  This test is not yet approved or cleared by the [place] FDA and  has been authorized for detection and/or diagnosis of SARS-CoV-2 by FDA under an Emergency Use Authorization (EUA). This EUA will remain  in effect (meaning this test can be used) for the duration of the COVID-19 declaration under Se ction 564(b)(1) of the Act, 21 U.S.C. section 360bbb-3(b)(1), unless the authorization is terminated or revoked sooner.  Performed at [facility], [place] [address] [place], [place] [address]   Culture, blood (routine x 2)     Status: None (Preliminary result)   Collection Time: 11/09/19  1:58 AM   Specimen: BLOOD  Result Value Ref Range Status   Specimen Description BLOOD BLOOD LEFT FOREARM  Final   Special Requests   Final    BOTTLES DRAWN AEROBIC AND ANAEROBIC Blood Culture adequate volume   Culture   Final    NO GROWTH 2 DAYS Performed at [facility], [address] [place], [place] [address]    Report Status PENDING  Incomplete  Culture, blood (routine x 2)     Status: None (Preliminary result)   Collection Time: 11/09/19  1:58 AM   Specimen: BLOOD  Result Value Ref Range Status   Specimen Description BLOOD BLOOD RIGHT FOREARM  Final   Special Requests   Final    BOTTLES DRAWN AEROBIC AND ANAEROBIC Blood Culture adequate volume   Culture   Final    NO GROWTH 2 DAYS Performed at [facility], [address], [place], [place] [address]    Report Status PENDING  Incomplete  SARS Coronavirus 2 by RT PCR (hospital order, performed in [place] hospital lab) Nasopharyngeal Nasopharyngeal Swab     Status: None   Collection Time: 11/09/19  1:58 AM   Specimen: Nasopharyngeal Swab  Result Value Ref Range Status   SARS Coronavirus 2 NEGATIVE NEGATIVE Final    Comment: (NOTE) SARS-CoV-2 target nucleic acids are NOT DETECTED.  The SARS-CoV-2 RNA is generally detectable in upper and lower respiratory specimens during the acute phase of infection. The lowest concentration of SARS-CoV-2 viral copies this assay can detect is 250 copies / mL. A negative result does not preclude SARS-CoV-2 infection and should not be used as the sole basis for treatment or other patient management decisions.  A negative result may occur with improper specimen collection / handling, submission of specimen other than nasopharyngeal swab, presence of viral mutation(s) within the areas targeted by this assay, and inadequate number of viral copies (<250 copies / mL). A negative result must be combined with clinical observations, patient history, and epidemiological information.  Fact Sheet for Patients:   StrictlyIdeas.no  Fact Sheet for Healthcare Providers: BankingDealers.co.za  This test is not yet approved or  cleared by the [place] FDA and has been authorized for detection and/or  diagnosis of SARS-CoV-2 by FDA under an Emergency Use Authorization (EUA).  This EUA will remain in effect (meaning this test can be used) for the duration of the COVID-19 declaration under Section 564(b)(1) of the Act,  21 U.S.C. section 360bbb-3(b)(1), unless the authorization is terminated or revoked sooner.  Performed at [facility], [address] [place], [place] [address]   Urine culture     Status: None   Collection Time: 11/09/19  1:52 PM   Specimen: Urine, Random  Result Value Ref Range Status   Specimen Description   Final    URINE, RANDOM Performed at [facility], [address] [place], [place] [address]    Special Requests   Final    NONE Performed at [facility], [address] [place], [place] [address]    Culture   Final    NO GROWTH Performed at [facility], [place] [address] [place], [place] [address]    Report Status 11/11/2019 FINAL  Final  C Difficile Quick Screen w PCR reflex     Status: None   Collection Time: 11/11/19 10:33 AM   Specimen: STOOL  Result Value Ref Range Status   C Diff antigen NEGATIVE NEGATIVE Final   C Diff toxin NEGATIVE NEGATIVE Final   C Diff interpretation No C. difficile detected.  Final    Comment: Performed at [facility], [place] [address] [place], [place] [address]  Gastrointestinal Panel by PCR , Stool     Status: None   Collection Time: 11/11/19 10:33 AM   Specimen: Stool  Result Value Ref Range Status   Campylobacter species NOT DETECTED NOT DETECTED Final   Plesimonas shigelloides NOT DETECTED NOT DETECTED Final   Salmonella species NOT DETECTED NOT DETECTED Final   Yersinia enterocolitica NOT DETECTED NOT DETECTED Final   Vibrio species NOT DETECTED NOT DETECTED Final   Vibrio cholerae NOT DETECTED NOT DETECTED Final   Enteroaggregative E coli (EAEC) NOT DETECTED NOT DETECTED Final   Enteropathogenic E coli (EPEC) NOT DETECTED NOT DETECTED Final   Enterotoxigenic E  coli (ETEC) NOT DETECTED NOT DETECTED Final   Shiga like toxin producing E coli (STEC) NOT DETECTED NOT DETECTED Final   Shigella/Enteroinvasive E coli (EIEC) NOT DETECTED NOT DETECTED Final   Cryptosporidium NOT DETECTED NOT DETECTED Final   Cyclospora cayetanensis NOT DETECTED NOT DETECTED Final   Entamoeba histolytica NOT DETECTED NOT DETECTED Final   Giardia lamblia NOT DETECTED NOT DETECTED Final   Adenovirus F40/41 NOT DETECTED NOT DETECTED Final   Astrovirus NOT DETECTED NOT DETECTED Final   Norovirus GI/GII NOT DETECTED NOT DETECTED Final   Rotavirus A NOT DETECTED NOT DETECTED Final   Sapovirus (I, II, IV, and V) NOT DETECTED NOT DETECTED Final    Comment: Performed at [facility], [address], [place],  [address]         Radiology Studies: MR BRAIN W WO CONTRAST  Result Date: 11/09/2019 CLINICAL DATA:  69 year old female with recently diagnosed lung cancer. Staging. EXAM: MRI HEAD WITHOUT AND WITH CONTRAST TECHNIQUE: Multiplanar, multiecho pulse sequences of the brain and surrounding structures were obtained without and with intravenous contrast. CONTRAST:  34m GADAVIST GADOBUTROL 1 MMOL/ML IV SOLN COMPARISON:  PET-CT 10/27/2019, brain MRI 06/22/2019. FINDINGS: Brain: Study is intermittently degraded by motion artifact despite repeated imaging attempts. Postcontrast enhancement of the brain appears stable since March and within normal limits. No abnormal intracranial enhancement or dural thickening identified. No midline shift, mass effect, or evidence of intracranial mass lesion. No restricted diffusion to suggest acute infarction. No ventriculomegaly, extra-axial collection or acute intracranial hemorrhage. Cervicomedullary junction and pituitary are within normal limits. GPearline Cablesand white matter signal appears stable since March,  with a small area of post ischemic appearing encephalomalacia in the left centrum semiovale and corona radiata. There is chronic  superficial siderosis over both superior hemispheres greater on the right, better demonstrated on March SWI due to motion artifact today. There is also some left occipital lobe involvement. Vascular: Major intracranial vascular flow voids are stable. The major dural venous sinuses are enhancing and appear to be patent. Skull and upper cervical spine: Stable, negative. Sinuses/Orbits: Stable, negative. Other: Mastoids remain clear. Grossly normal visible internal auditory structures. Scalp and face soft tissues appear negative. IMPRESSION: 1. Intermittently motion degraded exam today but overall stable MRI appearance of the brain since March. No metastatic disease or acute intracranial abnormality identified. 2. 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 Chronic small vessel ischemia suspected in the left hemisphere white matter. Electronically Signed   By: Genevie Ann M.D.   On: 11/09/2019 22:59        Scheduled Meds: . enoxaparin (LOVENOX) injection  30 mg Subcutaneous Q24H  . feeding supplement (ENSURE ENLIVE)  237 mL Oral BID BM  . gabapentin  100 mg Oral TID  . ipratropium-albuterol  3 mL Nebulization TID  . multivitamin with minerals  1 tablet Oral Daily  . pantoprazole  40 mg Oral Daily  . traZODone  50 mg Oral QHS   Continuous Infusions: . azithromycin 500 mg (11/11/19 0520)  . piperacillin-tazobactam 3.375 g (11/11/19 9242)     LOS: 2 days    Time spent: 27 minutes    Sharen Hones, MD [facility]   To contact the attending provider between 7A-7P or the covering provider during after hours 7P-7A, please log into the web site [email or web address] and access using universal [place] password for that web site. If you do not have the password, please call the hospital operator.  11/11/2019, 2:23 PM

## 2019-11-11 NOTE — Evaluation (Signed)
Occupational Therapy Evaluation Patient Details Name: Anne Hardy MRN: [id number] DOB: [birth year] Today's Date: 11/11/2019    History of Present Illness 69yo female pt admitted for sepsis due to PNA. PMHx includes RUL lung cancer, colitis, RLL PNA, COPD, cervical disc disease, GERD, CVA (deficits resolved), and T12 kyphoplasty (01/2019).   Clinical Impression   Pt was seen for OT evaluation this date. Prior to hospital admission, pt reports for past 6-7 wks using w/c for mobility primarily 2/2 increasing weakness. Able to perform transfers from w/c and takes seated sponge baths. Pt lives with her sister in a 1 story home, 3 steps. Currently pt demonstrates impairments as described below (See OT problem list) which functionally limit her ability to perform ADL/self-care tasks. Pt currently requires Min A for LB ADL, CGA for ADL transfers, fatigues quickly, requiring additional time/effort/modifications to task to ensure safety. Pt not wearing O2 during session. Denies SOB. Pt reports starting home O2 about a week ago at home but is unable to recall if she is supposed to wear 24/7 or just during exertional activity. Pt performed STS transfer from EOB with CGA and SPT to [facility]. Pt would benefit from skilled OT to address noted impairments and functional limitations (see below for any additional details) in order to maximize safety and independence while minimizing falls risk and caregiver burden. Upon hospital discharge, recommend [place] and 24/7 supervision/assist to maximize pt safety and return to functional independence during meaningful occupations of daily life. If pt will not have adequate assist/support at home, pt would be safer discharging to STR for additional therapy to maximize pt safety and return to PLOF.      Follow Up Recommendations  Home health OT;Supervision/Assistance - 24 hour    Equipment Recommendations  3 in 1 bedside commode    Recommendations for Other Services        Precautions / Restrictions Precautions Precautions: Fall Precaution Comments: loose BMs with all mobility/exertion Restrictions Weight Bearing Restrictions: No      Mobility Bed Mobility Overal bed mobility: Needs Assistance Bed Mobility: Supine to Sit;Sit to Supine     Supine to sit: Supervision;HOB elevated Sit to supine: Supervision   General bed mobility comments: increased effort to perform  Transfers Overall transfer level: Needs assistance Equipment used: None Transfers: Sit to/from [facility] Sit to Stand: Min guard Stand pivot transfers: Min guard            Balance Overall balance assessment: Needs assistance Sitting-balance support: No upper extremity supported;Feet supported Sitting balance-Leahy Scale: Fair     Standing balance support: No upper extremity supported;During functional activity Standing balance-Leahy Scale: Fair Standing balance comment: no overt LOB getting to [facility] next to bed                           ADL either performed or assessed with clinical judgement   ADL Overall ADL's : Needs assistance/impaired                                       General ADL Comments: Min A For LB ADL and CGA for ADL transfers EOB. Fatigues quickly.     Vision Patient Visual Report: No change from baseline       Perception     Praxis      Pertinent Vitals/Pain Pain Assessment: 0-10 Pain Score: 6  Pain Location: R  shoulder - pinched nerve C6 which radiates out to shoulder Pain Descriptors / Indicators: Aching Pain Intervention(s): Limited activity within patient's tolerance;Monitored during session;Repositioned (pt declined therapist notifying RN)     Hand Dominance Right   Extremity/Trunk Assessment Upper Extremity Assessment Upper Extremity Assessment: Generalized weakness   Lower Extremity Assessment Lower Extremity Assessment: Generalized weakness       Communication  Communication Communication: No difficulties   Cognition Arousal/Alertness: Awake/alert Behavior During Therapy: WFL for tasks assessed/performed Overall Cognitive Status: No family/caregiver present to determine baseline cognitive functioning                                 General Comments: grossly WFL, pt unable to recall whether she is supposed to wear her O2 at all times or only with exertional activity, only started at home 1 week ago, per pt report. No family to verify. Able to follow commands well. Alert and oriented to self, place, slightly confused to situation (reports her lung cancer was caused by aspiration).   General Comments  VSS, pt not on O2 during session (notified RN, unable to check during session), no BM with mobility    Exercises Other Exercises Other Exercises: Pt instructed in role of OT, falls prevention   Shoulder Instructions      Home Living Family/patient expects to be discharged to:: Private residence Living Arrangements: Other relatives (sister) Available Help at Discharge: Family Type of Home: House Home Access: Stairs to enter [employer] of Steps: 3, L rail in garage   [place]: One level     Bathroom Shower/Tub: Teacher, early years/pre: [place]: Bedside commode          Prior Functioning/Environment Level of Independence: Needs assistance    ADL's / Homemaking Assistance Needed: started to require assist 5-6 wks ago after "major aspiration" which "caused the lung cancer" per pt report; sister does all cooking, cleaning, drives, sometimes assists with meds; takes sponge baths, ADL just more difficult 2/2 low energy   Comments: just started home 2L O2 a week ago and pt unable to recall if she's supposed to wear all the time or only during ambulation        OT Problem List: Decreased strength;Cardiopulmonary status limiting activity;Decreased cognition;Decreased safety  awareness;Decreased activity tolerance;Impaired balance (sitting and/or standing);Decreased knowledge of use of DME or AE      OT Treatment/Interventions: Self-care/ADL training;Therapeutic exercise;Therapeutic activities;Energy conservation;DME and/or AE instruction;Patient/family education;Balance training    OT Goals(Current goals can be found in the care plan section) Acute Rehab OT Goals Patient Stated Goal: get stronger and go home OT Goal Formulation: With patient Time For Goal Achievement: 11/25/19 Potential to Achieve Goals: Good ADL Goals Pt Will Transfer to Toilet: with modified independence;ambulating;bedside commode (LRAD for amb) Additional ADL Goal #1: Pt will perform seated bath with set up and supervision and maintaining O2 sats >90% on O2 as needed Additional ADL Goal #2: Pt will verbalize >2 learned energy conservation strategies to implement upon return home  OT Frequency: Min 1X/week   Barriers to D/C:            Co-evaluation              AM-PAC OT "6 Clicks" Daily Activity     Outcome Measure Help from another person eating meals?: None Help from another person taking care of personal grooming?: None Help from another  person toileting, which includes using toliet, bedpan, or urinal?: A Little Help from another person bathing (including washing, rinsing, drying)?: A Little Help from another person to put on and taking off regular upper body clothing?: None Help from another person to put on and taking off regular lower body clothing?: A Little 6 Click Score: 21   End of Session Nurse Communication: Mobility status;Other (comment) (no BM, off O2)  Activity Tolerance: Patient tolerated treatment well Patient left: in bed;with call bell/phone within reach;with bed alarm set;with nursing/sitter in room  OT Visit Diagnosis: Other abnormalities of gait and mobility (R26.89);Muscle weakness (generalized) (M62.81)                Time: 1572-6203 OT Time  Calculation (min): 22 min Charges:  OT General Charges $OT Visit: 1 Visit OT Evaluation $OT Eval Moderate Complexity: 1 Mod OT Treatments $Self Care/Home Management : 8-22 mins  Jeni Salles, MPH, MS, OTR/L ascom [phone] 11/11/19, 3:55 PM

## 2019-11-12 ENCOUNTER — Other Ambulatory Visit: Payer: Self-pay | Admitting: Hematology and Oncology

## 2019-11-12 DIAGNOSIS — Z515 Encounter for palliative care: Secondary | ICD-10-CM | POA: Diagnosis not present

## 2019-11-12 DIAGNOSIS — A419 Sepsis, unspecified organism: Secondary | ICD-10-CM | POA: Diagnosis not present

## 2019-11-12 DIAGNOSIS — C3411 Malignant neoplasm of upper lobe, right bronchus or lung: Secondary | ICD-10-CM | POA: Diagnosis not present

## 2019-11-12 DIAGNOSIS — J189 Pneumonia, unspecified organism: Secondary | ICD-10-CM | POA: Diagnosis not present

## 2019-11-12 DIAGNOSIS — E43 Unspecified severe protein-calorie malnutrition: Secondary | ICD-10-CM | POA: Diagnosis not present

## 2019-11-12 LAB — CBC WITH DIFFERENTIAL/PLATELET
Abs Immature Granulocytes: 0.12 10*3/uL — ABNORMAL HIGH (ref 0.00–0.07)
Basophils Absolute: 0.1 10*3/uL (ref 0.0–0.1)
Basophils Relative: 0 %
Eosinophils Absolute: 0.6 10*3/uL — ABNORMAL HIGH (ref 0.0–0.5)
Eosinophils Relative: 3 %
HCT: 32.9 % — ABNORMAL LOW (ref 36.0–46.0)
Hemoglobin: 10.4 g/dL — ABNORMAL LOW (ref 12.0–15.0)
Immature Granulocytes: 1 %
Lymphocytes Relative: 14 %
Lymphs Abs: 2.6 10*3/uL (ref 0.7–4.0)
MCH: 27.3 pg (ref 26.0–34.0)
MCHC: 31.6 g/dL (ref 30.0–36.0)
MCV: 86.4 fL (ref 80.0–100.0)
Monocytes Absolute: 1.3 10*3/uL — ABNORMAL HIGH (ref 0.1–1.0)
Monocytes Relative: 7 %
Neutro Abs: 14.4 10*3/uL — ABNORMAL HIGH (ref 1.7–7.7)
Neutrophils Relative %: 75 %
Platelets: 514 10*3/uL — ABNORMAL HIGH (ref 150–400)
RBC: 3.81 MIL/uL — ABNORMAL LOW (ref 3.87–5.11)
RDW: 15.9 % — ABNORMAL HIGH (ref 11.5–15.5)
WBC: 19 10*3/uL — ABNORMAL HIGH (ref 4.0–10.5)
nRBC: 0 % (ref 0.0–0.2)

## 2019-11-12 LAB — BASIC METABOLIC PANEL
Anion gap: 9 (ref 5–15)
BUN: <5 mg/dL — ABNORMAL LOW (ref 8–23)
CO2: 26 mmol/L (ref 22–32)
Calcium: 8.1 mg/dL — ABNORMAL LOW (ref 8.9–10.3)
Chloride: 100 mmol/L (ref 98–111)
Creatinine, Ser: 0.66 mg/dL (ref 0.44–1.00)
GFR calc Af Amer: >60 mL/min (ref >60)
GFR calc non Af Amer: >60 mL/min (ref >60)
Glucose, Bld: 92 mg/dL (ref 70–99)
Potassium: 4.3 mmol/L (ref 3.5–5.1)
Sodium: 135 mmol/L (ref 135–145)

## 2019-11-12 LAB — PROCALCITONIN: Procalcitonin: 3.25 ng/mL

## 2019-11-12 LAB — MAGNESIUM: Magnesium: 2.1 mg/dL (ref 1.7–2.4)

## 2019-11-12 MED ORDER — LOPERAMIDE HCL 2 MG PO TABS
2.0000 mg | ORAL_TABLET | Freq: Four times a day (QID) | ORAL | 0 refills | Status: AC | PRN
Start: 2019-11-12 — End: ?

## 2019-11-12 MED ORDER — LEVOFLOXACIN 250 MG PO TABS
250.0000 mg | ORAL_TABLET | Freq: Every day | ORAL | 0 refills | Status: AC
Start: 2019-11-12 — End: 2019-11-17

## 2019-11-12 NOTE — Progress Notes (Signed)
   11/12/19 0300  Assess: MEWS Score  ECG Heart Rate 74  Resp (!) 31  Assess: MEWS Score  MEWS Temp 0  MEWS Systolic 0  MEWS Pulse 0  MEWS RR 2  MEWS LOC 0  MEWS Score 2  MEWS Score Color Yellow  Assess: if the MEWS score is Yellow or Red  Were vital signs taken at a resting state? Yes  Focused Assessment No change from prior assessment  Early Detection of Sepsis Score *See Row Information* Low  MEWS guidelines implemented *See Row Information* No, vital signs rechecked (intermittent tachypnea noted, will recheck RR)

## 2019-11-12 NOTE — Discharge Summary (Signed)
Physician Discharge Summary  Patient ID: Anne Hardy MRN: [id number] DOB/AGE: Jun 11, 1950 69 y.o.  Admit date: 11/09/2019 Discharge date: 11/12/2019  Admission Diagnoses:  Discharge Diagnoses:  Principal Problem:   Sepsis ([place]) Active Problems:   Primary cancer of right upper lobe of lung ([place])   Colitis   RLL pneumonia   Palliative care encounter   Severe protein-calorie malnutrition [place] [employer]: less than 60% of standard weight) ([place])   Discharged Condition: fair  Hospital Course:  Patient is a 69 year old female with history of COPD, recent diagnosis of lung cancer,history of stroke and gastroesophageal reflux disease who present to the hospital with shortness of breath, nausea vomiting and diarrhea. Patient has been losing weight recently. Patient states that she had a massive amount of acid reflux about 4 to 5 weeks ago, with large amount of aspiration and choking. Since that time, she has been complaining short of breath, which is progressively getting worse. She was placed on oxygen about 2 to 3 weeks ago. She also has some clear mucus with cough. She did not have any fever or chills. She started have a nausea vomiting diarrhea for the last few days. Upon arriving the emergency room, a chest CT scan showed right lower lobe consolidation and mild colitis in the distal colon. She was treated with Rocephin, Zithromax and Flagyl.  7/28.Patient condition is consistent with aspiration pneumonia, she has a profound elevation of procalcitonin level. Antibiotic switched to Zosyn, Zithromax continued. Flagyl discontinued.  7/29.  Condition improving.  Continue antibiotics with Zosyn and Zithromax.  Speech therapy evaluation did not show any aspiration.  7/30.  Still has leukocytosis, procalcitonin level dropped down to 3.25.  Medically stable to be discharged.  #1.  Sepsis with the right lower lobe pneumonia secondary to aspiration pneumonia. Condition finally improved.   Patient developed skin rash today due to Zosyn.  Antibiotic will be switched to Levaquin for additional 5 days.  Penicillin allergy updated in chart.  2.  COPD with chronic  hypoxemic respiratory failure. Hypoxemia present before admission. Patient does not have any bronchospasm.  Continue oxygen treatment.  4.  Severe protein calorie malnutrition. Follow with PCP as outpatient.  5.  Lung cancer with metastasis. Follow-up with oncology as outpatient.  6.  Colitis. Improving.    Abdominal cramping is resolved.  Patient had more loose stools today due to Zosyn.  C. difficile toxin negative.  Started as needed Imodium.  7.  Hypokalemia. Supplemented   Consults: None  Significant Diagnostic Studies:  MRI HEAD WITHOUT AND WITH CONTRAST  TECHNIQUE: Multiplanar, multiecho pulse sequences of the brain and surrounding structures were obtained without and with intravenous contrast.  CONTRAST:  15mL GADAVIST GADOBUTROL 1 MMOL/ML IV SOLN  COMPARISON:  PET-CT 10/27/2019, brain MRI 06/22/2019.  FINDINGS: Brain: Study is intermittently degraded by motion artifact despite repeated imaging attempts.  Postcontrast enhancement of the brain appears stable since March and within normal limits. No abnormal intracranial enhancement or dural thickening identified. No midline shift, mass effect, or evidence of intracranial mass lesion.  No restricted diffusion to suggest acute infarction. No ventriculomegaly, extra-axial collection or acute intracranial hemorrhage. Cervicomedullary junction and pituitary are within normal limits.  Pearline Cables and white matter signal appears stable since March, with a small area of post ischemic appearing encephalomalacia in the left centrum semiovale and corona radiata.  There is chronic superficial siderosis over both superior hemispheres greater on the right, better demonstrated on March SWI due to motion artifact today. There is also some left  occipital  lobe involvement.  Vascular: Major intracranial vascular flow voids are stable. The major dural venous sinuses are enhancing and appear to be patent.  Skull and upper cervical spine: Stable, negative.  Sinuses/Orbits: Stable, negative.  Other: Mastoids remain clear. Grossly normal visible internal auditory structures. Scalp and face soft tissues appear negative.  IMPRESSION: 1. Intermittently motion degraded exam today but overall stable MRI appearance of the brain since March. No metastatic disease or acute intracranial abnormality identified.  2. 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 Chronic small vessel ischemia suspected in the left hemisphere white matter.   Electronically Signed   By: Genevie Ann M.D.   On: 11/09/2019 22:59  CT ABDOMEN AND PELVIS WITH CONTRAST  TECHNIQUE: Multidetector CT imaging of the abdomen and pelvis was performed using the standard protocol following bolus administration of intravenous contrast.  CONTRAST:  67mL OMNIPAQUE IOHEXOL 300 MG/ML  SOLN  COMPARISON:  10/27/2019 PET-CT  FINDINGS: Lower chest: Consolidation in the right lower lobe is noted. Small right pleural effusion is noted. The left lung is clear. On delayed images there is a focal soft tissue lesion in the right hilum which corresponds to areas of increased activity on recent PET-CT consistent with lymphadenopathy.  Hepatobiliary: No focal liver abnormality is seen. No gallstones, gallbladder wall thickening, or biliary dilatation. No focal mass lesion is noted to correspond with the area of increased metabolic activity on recent PET-CT.  Pancreas: Unremarkable. No pancreatic ductal dilatation or surrounding inflammatory changes.  Spleen: Normal in size without focal abnormality.  Adrenals/Urinary Tract: Adrenal glands are within normal limits. Kidneys are well visualized bilaterally within normal  enhancement pattern. Delayed images demonstrate normal excretion of contrast material. The bladder is within normal limits.  Stomach/Bowel: Appendix is not well visualized. The distal colon is decompressed with mild inflammatory change identified suggestive of mild colitis. These changes are new from the prior PET-CT. No small bowel abnormality is noted. The stomach is within normal limits.  Vascular/Lymphatic: Aortic calcifications are seen. No significant lymphadenopathy is noted.  Reproductive: Small calcified uterine fibroids are seen.  Other: No abdominal wall hernia or abnormality. No abdominopelvic ascites.  Musculoskeletal: Right hip fixation is noted. No acute bony abnormality is seen. Changes of prior vertebral augmentation are noted at T12. T10 compression deformity is noted. Mild L4 compression deformity is noted as well.  IMPRESSION: Right lower lobe consolidation as well as lymphadenopathy in the right hilar region similar to that seen on prior PET-CT.  Changes of mild colitis in the distal colon.  Chronic compression deformities.   Electronically Signed   By: Inez Catalina M.D.   On: 11/09/2019 03:59   Treatments: Antibiotics with Zosyn.  Discharge Exam: Blood pressure 111/72, pulse 93, temperature 98.2 F (36.8 C), temperature source Oral, resp. rate 18, height 5\' 3"  (1.6 m), weight (!) 41.3 kg, SpO2 97 %. General appearance: Appear chronically ill, severely malnourished. Resp: Decreased breathing sounds, more on the right side. Cardio: regular rate and rhythm, S1, S2 normal, no murmur, click, rub or gallop GI: soft, non-tender; bowel sounds normal; no masses,  no organomegaly Extremities: extremities normal, atraumatic, no cyanosis or edema  Disposition: Discharge disposition: 01-Home or Self Care       Discharge Instructions    Ambulatory referral to Hematology / Oncology   Complete by: As directed    Diet - low sodium heart healthy    Complete by: As directed    Increase activity slowly   Complete by: As directed  Allergies as of 11/12/2019      Reactions   Lactose Intolerance (gi)    Penicillins Rash      Medication List    STOP taking these medications   amoxicillin-clavulanate 875-125 MG tablet Commonly known as: AUGMENTIN     TAKE these medications   acetaminophen 500 MG tablet Commonly known as: TYLENOL Take 1,000 mg by mouth 3 (three) times daily as needed (pain.).   acetaminophen-codeine 300-30 MG tablet Commonly known as: TYLENOL #3 Take 1 tablet by mouth 2 (two) times daily as needed for moderate pain.   albuterol 108 (90 Base) MCG/ACT inhaler Commonly known as: VENTOLIN HFA Inhale 1-2 puffs into the lungs every 4 (four) hours as needed for wheezing or shortness of breath.   alendronate 70 MG tablet Commonly known as: FOSAMAX Take 70 mg by mouth every Tuesday.   B-complex with vitamin C tablet Take 1 tablet by mouth daily.   Breo Ellipta 100-25 MCG/INH Aepb Generic drug: fluticasone furoate-vilanterol Inhale 1 puff into the lungs daily as needed (asthma).   CALCIUM 500 + D3 PO Take 1 tablet by mouth daily.   cyclobenzaprine 5 MG tablet Commonly known as: FLEXERIL Take 5 mg by mouth at bedtime.   cyclobenzaprine 5 MG tablet Commonly known as: FLEXERIL Take 1 tablet (5 mg total) by mouth 2 (two) times daily as needed for muscle spasms.   famotidine 40 MG tablet Commonly known as: PEPCID Take 40 mg by mouth every evening.   gabapentin 100 MG capsule Commonly known as: NEURONTIN Take 100 mg by mouth 3 (three) times daily.   HYDROcodone-acetaminophen 5-325 MG tablet Commonly known as: NORCO/VICODIN Take 1 tablet by mouth every 6 (six) hours as needed (pain).   ipratropium-albuterol 0.5-2.5 (3) MG/3ML Soln Commonly known as: DUONEB Take 3 mLs by nebulization in the morning, at noon, and at bedtime.   levofloxacin 250 MG tablet Commonly known as: Levaquin Take 1 tablet  (250 mg total) by mouth daily for 5 days.   loperamide 2 MG tablet Commonly known as: Imodium A-D Take 1 tablet (2 mg total) by mouth 4 (four) times daily as needed for diarrhea or loose stools.   multivitamin with minerals Tabs tablet Take 1 tablet by mouth daily.   PROBIOTIC DAILY PO Take 1 capsule by mouth daily.   traZODone 50 MG tablet Commonly known as: DESYREL Take 50 mg by mouth at bedtime.   vitamin C 1000 MG tablet Take 1,000 mg by mouth daily.       Follow-up Information    Toni Arthurs, NP Follow up in 1 week(s).   Specialty: Family Medicine Contact information: [address] [place] [address] [phone]              35 minutes Signed: Sharen Hones 11/12/2019, 9:23 AM

## 2019-11-12 NOTE — Progress Notes (Signed)
[place]  [phone] [phone]   Name: Anne Hardy Date: 11/12/2019 MRN: [id number]  DOB: May 05, 1950  Patient Care Team: Toni Arthurs, NP as PCP - General (Family Medicine) Lequita Asal, MD as Referring Physician (Hematology and Oncology) Ottie Glazier, MD as Consulting Physician (Pulmonary Disease) Erby Pian, MD as Referring Physician (Specialist)    REASON FOR CONSULTATION: Anne Hardy is a 69 y.o. female with multiple medical problems including COPD on home O2, recent diagnosis of lung cancer with PET positive right upper lobe lung mass with hypermetabolic hilar and mediastinal adenopathy but no distant metastasis.  Patient has had chronically poor oral intake and weight loss.  She was admitted to the hospital 11/09/2019 with sepsis and a right lower lobe pneumonia concerning for aspiration.  Patient was also found to have colitis on CT.  Palliative care was consulted to address goals.   CODE STATUS: DNR  PAST MEDICAL HISTORY: Past Medical History:  Diagnosis Date   Arthritis    Cervical disc disease    COPD (chronic obstructive pulmonary disease) ([place])    Dyspnea    GERD (gastroesophageal reflux disease)    Headache    severe headaches   Stroke ([place]) 06/2018   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-no residual effedcts-this happened in [place]-Pt just moved to [place] 2 weeks ago (October 2020)    PAST SURGICAL HISTORY:  Past Surgical History:  Procedure Laterality Date   COLONOSCOPY WITH ESOPHAGOGASTRODUODENOSCOPY (EGD)     femur fx repair     FRACTURE SURGERY  2018   femur fx   KYPHOPLASTY N/A 01/28/2019   Procedure: T12 KYPHOPLASTY;  Surgeon: Hessie Knows, MD;  Location: [facility] ORS;  Service: Orthopedics;  Laterality: N/A;   OOPHORECTOMY Left    VIDEO BRONCHOSCOPY WITH ENDOBRONCHIAL NAVIGATION N/A 11/05/2019   Procedure: VIDEO BRONCHOSCOPY WITH  ENDOBRONCHIAL NAVIGATION;  Surgeon: Ottie Glazier, MD;  Location: [facility] ORS;  Service: Thoracic;  Laterality: N/A;   VIDEO BRONCHOSCOPY WITH ENDOBRONCHIAL ULTRASOUND N/A 11/05/2019   Procedure: VIDEO BRONCHOSCOPY WITH ENDOBRONCHIAL ULTRASOUND;  Surgeon: Ottie Glazier, MD;  Location: [facility] ORS;  Service: Thoracic;  Laterality: N/A;    HEMATOLOGY/ONCOLOGY HISTORY:  Oncology History   No history exists.    ALLERGIES:  is allergic to lactose intolerance (gi) and penicillins.  MEDICATIONS:  Current Facility-Administered Medications  Medication Dose Route Frequency Provider Last Rate Last Admin   acetaminophen (TYLENOL) tablet 650 mg  650 mg Oral Q6H PRN Athena Masse, MD       Or   acetaminophen (TYLENOL) suppository 650 mg  650 mg Rectal Q6H PRN Athena Masse, MD       azithromycin (ZITHROMAX) 500 mg in sodium chloride 0.9 % 250 mL IVPB  500 mg Intravenous Q24H Judd Gaudier V, MD 250 mL/hr at 11/12/19 0520 500 mg at 11/12/19 0520   enoxaparin (LOVENOX) injection 30 mg  30 mg Subcutaneous Q24H Judd Gaudier V, MD   30 mg at 11/12/19 0518   feeding supplement (ENSURE ENLIVE) (ENSURE ENLIVE) liquid 237 mL  237 mL Oral BID BM Sharen Hones, MD       gabapentin (NEURONTIN) capsule 100 mg  100 mg Oral TID Max Sane, MD   100 mg at 11/12/19 0903   HYDROcodone-acetaminophen (NORCO/VICODIN) 5-325 MG per tablet 1 tablet  1 tablet Oral Q6H PRN Max Sane, MD   1 tablet at 11/12/19 0903   iohexol (OMNIPAQUE) 9 MG/ML oral solution 500 mL  500 mL Oral BID PRN Paulette Blanch, MD   500 mL at 11/09/19 9937   ipratropium-albuterol (DUONEB) 0.5-2.5 (3) MG/3ML nebulizer solution 3 mL  3 mL Nebulization TID Max Sane, MD   3 mL at 11/12/19 1696   multivitamin with minerals tablet 1 tablet  1 tablet Oral Daily Sharen Hones, MD   1 tablet at 11/12/19 0903   ondansetron (ZOFRAN) tablet 4 mg  4 mg Oral Q6H PRN Athena Masse, MD       Or   ondansetron [facility]) injection 4 mg  4 mg Intravenous  Q6H PRN Athena Masse, MD   4 mg at 11/10/19 2210   pantoprazole (PROTONIX) EC tablet 40 mg  40 mg Oral Daily Sharen Hones, MD   40 mg at 11/12/19 7893   traZODone (DESYREL) tablet 50 mg  50 mg Oral QHS Max Sane, MD   50 mg at 11/11/19 2045    VITAL SIGNS: BP 113/77 (BP Location: Right Arm)    Pulse 87    Temp 99 F (37.2 C)    Resp 17    Ht 5\' 3"  (1.6 m)    Wt (!) 91 lb 0.8 oz (41.3 kg)    SpO2 97%    BMI 16.13 kg/m  Filed Weights   11/09/19 0144 11/11/19 0300 11/12/19 0457  Weight: (!) 88 lb (39.9 kg) (!) 90 lb 9.6 oz (41.1 kg) (!) 91 lb 0.8 oz (41.3 kg)    Estimated body mass index is 16.13 kg/m as calculated from the following:   Height as of this encounter: 5\' 3"  (1.6 m).   Weight as of this encounter: 91 lb 0.8 oz (41.3 kg).  LABS: CBC:    Component Value Date/Time   WBC 19.0 (H) 11/12/2019 0445   HGB 10.4 (L) 11/12/2019 0445   HCT 32.9 (L) 11/12/2019 0445   PLT 514 (H) 11/12/2019 0445   MCV 86.4 11/12/2019 0445   NEUTROABS 14.4 (H) 11/12/2019 0445   LYMPHSABS 2.6 11/12/2019 0445   MONOABS 1.3 (H) 11/12/2019 0445   EOSABS 0.6 (H) 11/12/2019 0445   BASOSABS 0.1 11/12/2019 0445   Comprehensive Metabolic Panel:    Component Value Date/Time   NA 135 11/12/2019 0445   K 4.3 11/12/2019 0445   CL 100 11/12/2019 0445   CO2 26 11/12/2019 0445   BUN <5 (L) 11/12/2019 0445   CREATININE 0.66 11/12/2019 0445   GLUCOSE 92 11/12/2019 0445   CALCIUM 8.1 (L) 11/12/2019 0445   AST 12 (L) 11/09/2019 0136   ALT 13 11/09/2019 0136   ALKPHOS 100 11/09/2019 0136   BILITOT 0.9 11/09/2019 0136   PROT 6.5 11/09/2019 0136   ALBUMIN 3.0 (L) 11/09/2019 0136    RADIOGRAPHIC STUDIES: CT CHEST WO CONTRAST  Result Date: 11/02/2019 CLINICAL DATA:  Pulmonary mass, mediastinal adenopathy, pre biopsy planning EXAM: CT CHEST WITHOUT CONTRAST TECHNIQUE: Multidetector CT imaging of the chest was performed following the standard protocol without IV contrast. COMPARISON:  CT 10/07/2019,  PET-CT 10/27/2019 FINDINGS: Cardiovascular: Mild coronary artery calcification. Global cardiac size within normal limits. No pericardial effusion. Mild atherosclerotic calcification within the thoracic aorta without aneurysm. Mediastinum/Nodes: Confluency right paratracheal and right hilar adenopathy with the suprahilar mass is again identified. Distinct measurement of the right hilar mass in paratracheal adenopathy is no longer possible as these appears subtly confluency measuring 7.7 x 4.5 cm at axial image # 16/2. The confluency adenopathy abuts the ascending aorta and compresses the superior vena cava which is difficult to  differentiate on this noncontrast examination. There is increasing soft tissue obliteration of the right mainstem bronchus as well as complete opacification of the right upper lobe pulmonary bronchus likely related to invasive disease with endobronchial extension. There is airway impaction within the segmental bronchi of the right lower lobe and right middle lobe which may represent mucous plugging given its relatively rapid development since prior examination. Pathologic aortopulmonary lymphadenopathy is again identified with the lymph node measuring 13 mm in short axis diameter at axial image # 18. Lungs/Pleura: Progressive postobstructive pneumonitis noted within the right upper lobe. There is increasing right-sided volume loss and right basilar atelectasis. Left lung is clear. Trace right pleural effusion has developed. No pneumothorax. Upper Abdomen: Unremarkable Musculoskeletal: Diffuse body wall wasting. T12 vertebroplasty has been performed. No lytic or blastic bone lesions are seen. Bland appearing T10 and T11 compression fractures with moderate to severe loss of height are stable since prior examination. IMPRESSION: Progressive confluence of right hilar and right paratracheal adenopathy with the suprahilar mass, now inseparable. Increasing soft tissue within the right mainstem  bronchus, now nearly completely obliterated, likely representing endobronchial extension of disease. Increasing mucoid impaction within the right middle and lower lobe with increasing right-sided volume loss and postobstructive pneumonitis, particularly within the right upper lobe. Electronically Signed   By: Fidela Salisbury MD   On: 11/02/2019 23:09   MR BRAIN W WO CONTRAST  Result Date: 11/09/2019 CLINICAL DATA:  69 year old female with recently diagnosed lung cancer. Staging. EXAM: MRI HEAD WITHOUT AND WITH CONTRAST TECHNIQUE: Multiplanar, multiecho pulse sequences of the brain and surrounding structures were obtained without and with intravenous contrast. CONTRAST:  57mL GADAVIST GADOBUTROL 1 MMOL/ML IV SOLN COMPARISON:  PET-CT 10/27/2019, brain MRI 06/22/2019. FINDINGS: Brain: Study is intermittently degraded by motion artifact despite repeated imaging attempts. Postcontrast enhancement of the brain appears stable since March and within normal limits. No abnormal intracranial enhancement or dural thickening identified. No midline shift, mass effect, or evidence of intracranial mass lesion. No restricted diffusion to suggest acute infarction. No ventriculomegaly, extra-axial collection or acute intracranial hemorrhage. Cervicomedullary junction and pituitary are within normal limits. Pearline Cables and white matter signal appears stable since March, with a small area of post ischemic appearing encephalomalacia in the left centrum semiovale and corona radiata. There is chronic superficial siderosis over both superior hemispheres greater on the right, better demonstrated on March SWI due to motion artifact today. There is also some left occipital lobe involvement. Vascular: Major intracranial vascular flow voids are stable. The major dural venous sinuses are enhancing and appear to be patent. Skull and upper cervical spine: Stable, negative. Sinuses/Orbits: Stable, negative. Other: Mastoids remain clear. Grossly normal  visible internal auditory structures. Scalp and face soft tissues appear negative. IMPRESSION: 1. Intermittently motion degraded exam today but overall stable MRI appearance of the brain since March. No metastatic disease or acute intracranial abnormality identified. 2. 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 Chronic small vessel ischemia suspected in the left hemisphere white matter. Electronically Signed   By: Genevie Ann M.D.   On: 11/09/2019 22:59   CT Abdomen Pelvis W Contrast  Result Date: 11/09/2019 CLINICAL DATA:  History of lung carcinoma with abdominal pain, initial encounter EXAM: CT ABDOMEN AND PELVIS WITH CONTRAST TECHNIQUE: Multidetector CT imaging of the abdomen and pelvis was performed using the standard protocol following bolus administration of intravenous contrast. CONTRAST:  73mL OMNIPAQUE IOHEXOL 300 MG/ML  SOLN COMPARISON:  10/27/2019 PET-CT FINDINGS: Lower chest: Consolidation in the right  lower lobe is noted. Small right pleural effusion is noted. The left lung is clear. On delayed images there is a focal soft tissue lesion in the right hilum which corresponds to areas of increased activity on recent PET-CT consistent with lymphadenopathy. Hepatobiliary: No focal liver abnormality is seen. No gallstones, gallbladder wall thickening, or biliary dilatation. No focal mass lesion is noted to correspond with the area of increased metabolic activity on recent PET-CT. Pancreas: Unremarkable. No pancreatic ductal dilatation or surrounding inflammatory changes. Spleen: Normal in size without focal abnormality. Adrenals/Urinary Tract: Adrenal glands are within normal limits. Kidneys are well visualized bilaterally within normal enhancement pattern. Delayed images demonstrate normal excretion of contrast material. The bladder is within normal limits. Stomach/Bowel: Appendix is not well visualized. The distal colon is decompressed with mild inflammatory change identified  suggestive of mild colitis. These changes are new from the prior PET-CT. No small bowel abnormality is noted. The stomach is within normal limits. Vascular/Lymphatic: Aortic calcifications are seen. No significant lymphadenopathy is noted. Reproductive: Small calcified uterine fibroids are seen. Other: No abdominal wall hernia or abnormality. No abdominopelvic ascites. Musculoskeletal: Right hip fixation is noted. No acute bony abnormality is seen. Changes of prior vertebral augmentation are noted at T12. T10 compression deformity is noted. Mild L4 compression deformity is noted as well. IMPRESSION: Right lower lobe consolidation as well as lymphadenopathy in the right hilar region similar to that seen on prior PET-CT. Changes of mild colitis in the distal colon. Chronic compression deformities. Electronically Signed   By: Inez Catalina M.D.   On: 11/09/2019 03:59   NM PET Image Initial (PI) Skull Base To Thigh  Result Date: 10/27/2019 CLINICAL DATA:  Initial treatment strategy for right lung mass, suspected bronchogenic carcinoma. EXAM: NUCLEAR MEDICINE PET SKULL BASE TO THIGH TECHNIQUE: 5.55 mCi F-18 FDG was injected intravenously. Full-ring PET imaging was performed from the skull base to thigh after the radiotracer. CT data was obtained and used for attenuation correction and anatomic localization. Fasting blood glucose: 102 mg/dl COMPARISON:  Chest CT 10/07/2019 FINDINGS: Mediastinal blood pool activity: SUV max 2.4 NECK: No hypermetabolic cervical lymph nodes are identified.There are no lesions of the pharyngeal mucosal space. Incidental CT findings: none CHEST: The previously demonstrated confluent mediastinal and right hilar adenopathy is hypermetabolic with central necrosis. A right paratracheal component measuring up to 4.4 x 3.9 cm on image 76/3 has an SUV max of 11.8. Contiguous subcarinal component has an SUV max of 13.3. There is a separate high right paratracheal node measuring 6 mm on image 60/3  which has an SUV max of 4.3. There are small hypermetabolic nodes within the AP window, but no contralateral hilar adenopathy. Contiguous right upper lobe mass measuring 4.2 x 3.6 cm on image 72/3 also demonstrates central necrosis and peripheral hypermetabolic activity. No separate suspicious pulmonary nodules are identified. Incidental CT findings: Underlying centrilobular emphysema with postobstructive pneumonitis in the right upper and lower lobes. The right mainstem, right upper and middle lobe bronchi are occluded. There is atherosclerosis of the aorta, great vessels and coronary arteries. ABDOMEN/PELVIS: There is ill-defined mildly increased metabolic activity peripherally in the inferior aspect of the right hepatic lobe. This is near the costal margin and has an SUV max of 4.2. There is no clear corresponding lesion on the CT images. The significance of this finding is uncertain, although metastatic disease is not strongly suspected. There is no hypermetabolic activity within the adrenal glands, spleen or pancreas. There is no hypermetabolic nodal activity. Incidental CT  findings: Aortic and branch vessel atherosclerosis. Possible pelvic floor laxity. SKELETON: There is no hypermetabolic activity to suggest osseous metastatic disease. Grossly stable compression fractures at T10, T11 and T12 post spinal augmentation at T12. Low level metabolic activity at N82 appears treatment related. Incidental CT findings: Previous right proximal femoral ORIF. IMPRESSION: 1. The previously demonstrated right upper lobe lung mass and the confluent right hilar and mediastinal adenopathy are hypermetabolic consistent with bronchogenic carcinoma. Central necrosis suggests squamous cell carcinoma. 2. No definite distant metastases. Peripheral metabolic activity in the inferior aspect of the right hepatic lobe is relatively linear in orientation and without clear corresponding finding on the CT images. 3. Stable compression  deformities at T10, T11 and T12. Electronically Signed   By: Richardean Sale M.D.   On: 10/27/2019 13:26   DG Chest Port 1 View  Result Date: 11/09/2019 CLINICAL DATA:  Weakness EXAM: PORTABLE CHEST 1 VIEW COMPARISON:  November 05, 2019 FINDINGS: The heart size and mediastinal contours are unchanged. There appears to be a right suprahilar ill-defined mass/airspace opacity as on the prior exam. There is elevation of the right hemidiaphragm with subsegmental atelectasis. There is a small right pleural effusion present. The left lung is clear. No acute osseous abnormality. IMPRESSION: Right suprahilar mass with surrounding airspace opacities, not significantly changed since prior exam. Probable small right pleural effusion. Electronically Signed   By: Prudencio Pair M.D.   On: 11/09/2019 02:06   DG Chest Port 1 View  Result Date: 11/05/2019 CLINICAL DATA:  Status post bronchoscopy. EXAM: PORTABLE CHEST 1 VIEW COMPARISON:  CT 11/02/2019. FINDINGS: Right suprahilar mass with associated adjacent atelectasis/infiltrate again noted. Reference is made to CT report of 11/02/2019. No pleural effusion. No evidence of pneumothorax post bronchoscopy. Heart size stable. Interposition of colon under the right hemidiaphragm. This appears more prominent than on prior CT. To completely exclude free intraperitoneal air abdominal series suggested. Moderate gastric distention. This could also be further evaluated with abdominal series. Prior lower thoracic vertebroplasty. Multiple thoracic spine compression fractures again noted. IMPRESSION: 1. Right suprahilar mass with associated adjacent atelectasis/infiltrate again noted. Reference is made to prior CT report 11/02/2019. No evidence of pneumothorax post bronchoscopy. 2. Interposition of the colon under the right hemidiaphragm again noted. This is appears more prominent than on prior CT. To exclude free intraperitoneal air abdominal series suggested. Moderate gastric distention. This  can also be further evaluated with abdominal series. Electronically Signed   By: Marcello Moores  Register   On: 11/05/2019 09:59   DG C-Arm 1-60 Min-No Report  Result Date: 11/05/2019 Fluoroscopy was utilized by the requesting physician.  No radiographic interpretation.    PERFORMANCE STATUS (ECOG) : 2 - Symptomatic, <50% confined to bed  Review of Systems Unless otherwise noted, a complete review of systems is negative.  Physical Exam General: NAD Pulmonary: unlabored Extremities: no edema, no joint deformities Skin: no rashes Neurological: Weakness but otherwise nonfocal  IMPRESSION: Plan to discharge patient home later today with home health PT/OT.  Patient verbalized agreement with this plan.  She will need follow-up in the cancer center with Dr. Mike Gip to clarify treatment goals.  Will order home-based palliative care.  Symptomatically, patient reports doing well this morning.  PLAN: -Home with home health -Home-based palliative care -Follow-up in the cancer center with Dr. Mike Gip   Time Total: 15 minutes  Visit consisted of counseling and education dealing with the complex and emotionally intense issues of symptom management and palliative care in the setting of serious and potentially  life-threatening illness.Greater than 50%  of this time was spent counseling and coordinating care related to the above assessment and plan.  Signed by: Altha Harm, PhD, NP-C

## 2019-11-12 NOTE — Plan of Care (Signed)
Discharge instructions provided to pt.  All questions addressed.  Understanding verified through teach back.  Awaiting transportation home via POV.   Problem: Education: Goal: Knowledge of General Education information will improve Description: Including pain rating scale, medication(s)/side effects and non-pharmacologic comfort measures Outcome: Adequate for Discharge   Problem: Health Behavior/Discharge Planning: Goal: Ability to manage health-related needs will improve Outcome: Adequate for Discharge   Problem: Clinical Measurements: Goal: Ability to maintain clinical measurements within normal limits will improve Outcome: Adequate for Discharge Goal: Will remain free from infection Outcome: Adequate for Discharge Goal: Diagnostic test results will improve Outcome: Adequate for Discharge Goal: Respiratory complications will improve Outcome: Adequate for Discharge Goal: Cardiovascular complication will be avoided Outcome: Adequate for Discharge   Problem: Activity: Goal: Risk for activity intolerance will decrease Outcome: Adequate for Discharge   Problem: Nutrition: Goal: Adequate nutrition will be maintained Outcome: Adequate for Discharge   Problem: Coping: Goal: Level of anxiety will decrease Outcome: Adequate for Discharge   Problem: Elimination: Goal: Will not experience complications related to bowel motility Outcome: Adequate for Discharge Goal: Will not experience complications related to urinary retention Outcome: Adequate for Discharge   Problem: Pain Managment: Goal: General experience of comfort will improve Outcome: Adequate for Discharge   Problem: Safety: Goal: Ability to remain free from injury will improve Outcome: Adequate for Discharge   Problem: Skin Integrity: Goal: Risk for impaired skin integrity will decrease Outcome: Adequate for Discharge   Problem: Malnutrition  (NI-5.2) Goal: Food and/or nutrient delivery Description:  Individualized approach for food/nutrient provision. Outcome: Adequate for Discharge   Problem: Acute Rehab OT Goals (only OT should resolve) Goal: Pt. Will Transfer To Toilet Outcome: Adequate for Discharge Goal: OT Additional ADL Goal #1 Outcome: Adequate for Discharge Goal: OT Additional ADL Goal #2 Outcome: Adequate for Discharge   Problem: Acute Rehab PT Goals(only PT should resolve) Goal: Pt Will Transfer Bed To Chair/Chair To Bed Outcome: Adequate for Discharge Goal: Pt Will Ambulate Outcome: Adequate for Discharge Goal: Pt Will Go Up/Down Stairs Outcome: Adequate for Discharge

## 2019-11-12 NOTE — Care Management Important Message (Signed)
Important Message  Patient Details  Name: Anne Hardy MRN: [id number] Date of Birth: [birth year]   Medicare Important Message Given:  Yes     Dannette Barbara 11/12/2019, 11:46 AM

## 2019-11-12 NOTE — Progress Notes (Signed)
   11/11/19 2300  Assess: MEWS Score  ECG Heart Rate 86  Resp 23  Assess: MEWS Score  MEWS Temp 0  MEWS Systolic 1  MEWS Pulse 0  MEWS RR 1  MEWS LOC 0  MEWS Score 2  MEWS Score Color Yellow  Assess: if the MEWS score is Yellow or Red  Were vital signs taken at a resting state? Yes  Focused Assessment No change from prior assessment  Early Detection of Sepsis Score *See Row Information* Low  MEWS guidelines implemented *See Row Information* No, previously yellow, continue vital signs every 4 hours (intermittent tachypnea noted; will recheck RR)

## 2019-11-12 NOTE — Discharge Instructions (Signed)
Community-Acquired Pneumonia, Adult Pneumonia is an infection of the lungs. It causes swelling in the airways of the lungs. Mucus and fluid may also build up inside the airways. One type of pneumonia can happen while a person is in a hospital. A different type can happen when a person is not in a hospital (community-acquired pneumonia).  What are the causes?  This condition is caused by germs (viruses, bacteria, or fungi). Some types of germs can be passed from one person to another. This can happen when you breathe in droplets from the cough or sneeze of an infected person. What increases the risk? You are more likely to develop this condition if you:  Have a long-term (chronic) disease, such as: ? Chronic obstructive pulmonary disease (COPD). ? Asthma. ? Cystic fibrosis. ? Congestive heart failure. ? Diabetes. ? Kidney disease.  Have HIV.  Have sickle cell disease.  Have had your spleen removed.  Do not take good care of your teeth and mouth (poor dental hygiene).  Have a medical condition that increases the risk of breathing in droplets from your own mouth and nose.  Have a weakened body defense system (immune system).  Are a smoker.  Travel to areas where the germs that cause this illness are common.  Are around certain animals or the places they live. What are the signs or symptoms?  A dry cough.  A wet (productive) cough.  Fever.  Sweating.  Chest pain. This often happens when breathing deeply or coughing.  Fast breathing or trouble breathing.  Shortness of breath.  Shaking chills.  Feeling tired (fatigue).  Muscle aches. How is this treated? Treatment for this condition depends on many things. Most adults can be treated at home. In some cases, treatment must happen in a hospital. Treatment may include:  Medicines given by mouth or through an IV tube.  Being given extra oxygen.  Respiratory therapy. In rare cases, treatment for very bad pneumonia  may include:  Using a machine to help you breathe.  Having a procedure to remove fluid from around your lungs. Follow these instructions at home: Medicines  Take over-the-counter and prescription medicines only as told by your doctor. ? Only take cough medicine if you are losing sleep.  If you were prescribed an antibiotic medicine, take it as told by your doctor. Do not stop taking the antibiotic even if you start to feel better. General instructions   Sleep with your head and neck raised (elevated). You can do this by sleeping in a recliner or by putting a few pillows under your head.  Rest as needed. Get at least 8 hours of sleep each night.  Drink enough water to keep your pee (urine) pale yellow.  Eat a healthy diet that includes plenty of vegetables, fruits, whole grains, low-fat dairy products, and lean protein.  Do not use any products that contain nicotine or tobacco. These include cigarettes, e-cigarettes, and chewing tobacco. If you need help quitting, ask your doctor.  Keep all follow-up visits as told by your doctor. This is important. How is this prevented? A shot (vaccine) can help prevent pneumonia. Shots are often suggested for:  People older than 67 years of age.  People older than 67 years of age who: ? Are having cancer treatment. ? Have long-term (chronic) lung disease. ? Have problems with their body's defense system. You may also prevent pneumonia if you take these actions:  Get the flu (influenza) shot every year.  Go to the dentist as   often as told.  Wash your hands often. If you cannot use soap and water, use hand sanitizer. Contact a doctor if:  You have a fever.  You lose sleep because your cough medicine does not help. Get help right away if:  You are short of breath and it gets worse.  You have more chest pain.  Your sickness gets worse. This is very serious if: ? You are an older adult. ? Your body's defense system is weak.  You  cough up blood. Summary  Pneumonia is an infection of the lungs.  Most adults can be treated at home. Some will need treatment in a hospital.  Drink enough water to keep your pee pale yellow.  Get at least 8 hours of sleep each night. This information is not intended to replace advice given to you by your health care provider. Make sure you discuss any questions you have with your health care provider. Document Revised: 07/22/2018 Document Reviewed: 11/27/2017 Elsevier Patient Education  2020 [employer]  

## 2019-11-12 NOTE — TOC Initial Note (Signed)
Transition of Care [facility]) - Initial/Assessment Note    Patient Details  Name: Anne Hardy MRN: [id number] Date of Birth: Aug 18, 1950  Transition of [facility]) CM/SW Contact:    Eileen Stanford, LCSW Phone Number: 11/12/2019, 9:26 AM  Clinical Narrative:   Pt is alert and oriented. Pt states she lives with her sister. Pt states she has home health in [place] before. Pt did not have a preference for [facility] agency. Pt is agreeable to [place]. CSW provided [facility] agency referral. Pt states she has all equipment she needs--pt states she has a walk, cane, and bathroom equipment.                Expected Discharge Plan: [place] Barriers to Discharge: Continued Medical Work up   Patient Goals and CMS Choice Patient states their goals for this hospitalization and ongoing recovery are:: to get better   Choice offered to / list presented to : Patient  Expected Discharge Plan and Services Expected Discharge Plan: [place] In-house Referral: Clinical Social Work   Post Acute Care Choice: [place] arrangements for the past 2 months: [place] Expected Discharge Date: 11/12/19                 DME Agency: NA       HH Arranged: PT, OT, RN [place] Agency: [place] ([facility]) Date HH Agency Contacted: 11/12/19 Time [place]: [phone] Representative spoke with at [place]: Corene Cornea  Prior Living Arrangements/Services Living arrangements for the past 2 months: [place] with:: Siblings Patient language and need for interpreter reviewed:: Yes Do you feel safe going back to the place where you live?: Yes      Need for Family Participation in Patient Care: Yes (Comment) Care giver support system in place?: Yes (comment)   Criminal Activity/Legal Involvement Pertinent to Current Situation/Hospitalization: No - Comment as needed  Activities of Daily Living Home Assistive Devices/Equipment: None ADL  Screening (condition at time of admission) Patient's cognitive ability adequate to safely complete daily activities?: Yes Is the patient deaf or have difficulty hearing?: No Does the patient have difficulty seeing, even when wearing glasses/contacts?: No Does the patient have difficulty concentrating, remembering, or making decisions?: No Patient able to express need for assistance with ADLs?: Yes Does the patient have difficulty dressing or bathing?: No Independently performs ADLs?: Yes (appropriate for developmental age) Does the patient have difficulty walking or climbing stairs?: Yes Weakness of Legs: Both Weakness of Arms/Hands: None  Permission Sought/Granted Permission sought to share information with : Family Supports    Share Information with NAME: Benjamine Mola  Permission granted to share info w AGENCY: [place] granted to share info w Relationship: sister     Emotional Assessment Appearance:: Appears stated age Attitude/Demeanor/Rapport: Engaged Affect (typically observed): Accepting, Appropriate Orientation: : Oriented to Self, Oriented to Place, Oriented to  Time, Oriented to Situation Alcohol / Substance Use: Not Applicable Psych Involvement: No (comment)  Admission diagnosis:  Colitis [K52.9] Hyponatremia [E87.1] Generalized weakness [R53.1] Sepsis ([place]) [A41.9] Leukocytosis, unspecified type [D72.829] Patient Active Problem List   Diagnosis Date Noted  . Severe protein-calorie malnutrition Altamease Oiler: less than 60% of standard weight) ([place]) 11/11/2019  . Palliative care encounter   . Sepsis ([place]) 11/09/2019  . Colitis 11/09/2019  . RLL pneumonia 11/09/2019  . 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 Anemia 10/24/2019  . Weight loss 10/24/2019  . Mass of right  lung 10/22/2019  . Primary cancer of right upper lobe of lung ([place]) 10/08/2019   PCP:  Toni Arthurs, NP Pharmacy:   [facility] [address] [place] [address] Phone: [phone] Fax: [phone]     Social Determinants of Health (SDOH) Interventions    Readmission Risk Interventions No flowsheet data found.

## 2019-11-12 NOTE — Progress Notes (Signed)
Tumor Board Documentation  Grenda Lora was presented by Dr Mike Gip at our Tumor Board on 11/11/2019, which included representatives from medical oncology, radiation oncology, surgical oncology, internal medicine, navigation, pathology, radiology, surgical, pharmacy, genetics, research, palliative care, pulmonology.  Maeby currently presents as a new patient, for [place], for new positive pathology with history of the following treatments: active survellience, surgical intervention(s).  Additionally, we reviewed previous medical and familial history, history of present illness, and recent lab results along with all available histopathologic and imaging studies. The tumor board considered available treatment options and made the following recommendations: Concurrent chemo-radiation therapy, Immunotherapy (versus Hospice)    The following procedures/referrals were also placed: No orders of the defined types were placed in this encounter.   Clinical Trial Status: not discussed   Staging used: AJCC Stage Group  AJCC Staging: T: 2 N: 2 M: 0 Group: Squamous Cell Carcinoma of Lung  National site-specific guidelines NCCN were discussed with respect to the case.  Tumor board is a meeting of clinicians from various specialty areas who evaluate and discuss patients for whom a multidisciplinary approach is being considered. Final determinations in the plan of care are those of the provider(s). The responsibility for follow up of recommendations given during tumor board is that of the provider.   Today's extended care, comprehensive team conference, Kita was not present for the discussion and was not examined.   Multidisciplinary Tumor Board is a multidisciplinary case peer review process.  Decisions discussed in the Multidisciplinary Tumor Board reflect the opinions of the specialists present at the conference without having examined the patient.  Ultimately, treatment and diagnostic  decisions rest with the primary provider(s) and the patient.

## 2019-11-14 LAB — CULTURE, BLOOD (ROUTINE X 2)
Culture: NO GROWTH
Culture: NO GROWTH
Special Requests: ADEQUATE
Special Requests: ADEQUATE

## 2019-11-15 ENCOUNTER — Telehealth: Payer: Self-pay | Admitting: Primary Care

## 2019-11-15 ENCOUNTER — Ambulatory Visit: Payer: Medicare Other | Admitting: Hematology and Oncology

## 2019-11-15 NOTE — Telephone Encounter (Signed)
Spoke with patient's sister, Raynelle Jan, regarding Palliative services and she said that Toni Arthurs, NP was supposed to be sending over an order for Hospice services.  Sister wasn't sure if referral was being sent to [facility] or not.  Told sister that I would contact [facility] office to find out.  [facility] in [place] and spoke with Jerrel Ivory Nurse, and she did verify that she was faxing over a Hospice referral to [place].  I told RN that I would not cancel the Palliative referral until I know for sure that the patient was admitted to Hospice or not and she was in agreement with this.

## 2019-11-16 ENCOUNTER — Telehealth: Payer: Self-pay | Admitting: *Deleted

## 2019-11-16 NOTE — Telephone Encounter (Signed)
Called patient to give appointment info for consultation, per patients sister, Ms. Snooks does not want to pursue any treatment at this time.

## 2019-11-24 ENCOUNTER — Institutional Professional Consult (permissible substitution): Payer: Medicare Other | Admitting: Radiation Oncology

## 2019-12-15 DEATH — deceased
